# Patient Record
Sex: Female | Born: 1940 | Race: White | Hispanic: No | Smoking: Never smoker
Health system: Southern US, Community
[De-identification: ages and names within clinical notes are randomized; demographics above are authoritative.]

## PROBLEM LIST (undated history)

## (undated) DIAGNOSIS — E559 Vitamin D deficiency, unspecified: Secondary | ICD-10-CM

## (undated) DIAGNOSIS — E119 Type 2 diabetes mellitus without complications: Secondary | ICD-10-CM

## (undated) DIAGNOSIS — C801 Malignant (primary) neoplasm, unspecified: Secondary | ICD-10-CM

## (undated) DIAGNOSIS — K5190 Ulcerative colitis, unspecified, without complications: Secondary | ICD-10-CM

## (undated) DIAGNOSIS — I2510 Atherosclerotic heart disease of native coronary artery without angina pectoris: Secondary | ICD-10-CM

## (undated) DIAGNOSIS — I10 Essential (primary) hypertension: Secondary | ICD-10-CM

## (undated) DIAGNOSIS — E079 Disorder of thyroid, unspecified: Secondary | ICD-10-CM

## (undated) DIAGNOSIS — F0390 Unspecified dementia without behavioral disturbance: Secondary | ICD-10-CM

## (undated) DIAGNOSIS — N390 Urinary tract infection, site not specified: Secondary | ICD-10-CM

## (undated) DIAGNOSIS — K922 Gastrointestinal hemorrhage, unspecified: Secondary | ICD-10-CM

## (undated) HISTORY — DX: Disorder of thyroid, unspecified: E07.9

## (undated) HISTORY — DX: Vitamin D deficiency, unspecified: E55.9

## (undated) HISTORY — DX: Unspecified dementia, unspecified severity, without behavioral disturbance, psychotic disturbance, mood disturbance, and anxiety: F03.90

## (undated) HISTORY — DX: Type 2 diabetes mellitus without complications: E11.9

## (undated) HISTORY — DX: Urinary tract infection, site not specified: N39.0

## (undated) HISTORY — DX: Gastrointestinal hemorrhage, unspecified: K92.2

## (undated) HISTORY — PX: CORONARY ARTERY BYPASS GRAFT: SHX141

## (undated) HISTORY — PX: SKIN BIOPSY: SHX1

---

## 2004-08-17 ENCOUNTER — Ambulatory Visit: Payer: Self-pay | Admitting: Family Medicine

## 2004-10-18 ENCOUNTER — Ambulatory Visit: Payer: Self-pay | Admitting: Ophthalmology

## 2004-10-25 ENCOUNTER — Ambulatory Visit: Payer: Self-pay | Admitting: Ophthalmology

## 2005-01-17 ENCOUNTER — Ambulatory Visit: Payer: Self-pay | Admitting: Unknown Physician Specialty

## 2005-08-27 ENCOUNTER — Ambulatory Visit: Payer: Self-pay | Admitting: Family Medicine

## 2006-10-07 ENCOUNTER — Ambulatory Visit: Payer: Self-pay | Admitting: Internal Medicine

## 2007-07-28 ENCOUNTER — Other Ambulatory Visit: Payer: Self-pay

## 2007-07-28 ENCOUNTER — Ambulatory Visit: Payer: Self-pay | Admitting: Ophthalmology

## 2007-09-14 ENCOUNTER — Ambulatory Visit: Payer: Self-pay | Admitting: Family Medicine

## 2007-12-22 ENCOUNTER — Ambulatory Visit: Payer: Self-pay | Admitting: Family Medicine

## 2008-03-17 ENCOUNTER — Ambulatory Visit: Payer: Self-pay | Admitting: Internal Medicine

## 2010-01-21 ENCOUNTER — Ambulatory Visit: Payer: Self-pay | Admitting: Internal Medicine

## 2010-01-23 ENCOUNTER — Ambulatory Visit: Payer: Self-pay | Admitting: Family Medicine

## 2010-01-27 ENCOUNTER — Ambulatory Visit: Payer: Self-pay | Admitting: Family Medicine

## 2010-06-01 ENCOUNTER — Ambulatory Visit: Payer: Self-pay | Admitting: Internal Medicine

## 2010-06-07 ENCOUNTER — Inpatient Hospital Stay: Payer: Self-pay | Admitting: Internal Medicine

## 2010-07-21 ENCOUNTER — Ambulatory Visit: Payer: Self-pay

## 2010-07-21 ENCOUNTER — Ambulatory Visit: Payer: Self-pay | Admitting: Interventional Cardiology

## 2010-07-31 ENCOUNTER — Ambulatory Visit: Payer: Self-pay | Admitting: Internal Medicine

## 2011-10-23 ENCOUNTER — Ambulatory Visit: Payer: Self-pay | Admitting: Unknown Physician Specialty

## 2011-10-24 LAB — PATHOLOGY REPORT

## 2012-08-06 ENCOUNTER — Ambulatory Visit: Payer: Self-pay | Admitting: Internal Medicine

## 2012-09-08 ENCOUNTER — Ambulatory Visit: Payer: Self-pay | Admitting: Unknown Physician Specialty

## 2013-02-25 ENCOUNTER — Inpatient Hospital Stay: Payer: Self-pay | Admitting: Internal Medicine

## 2013-02-25 LAB — URINALYSIS, COMPLETE
Bilirubin,UR: NEGATIVE
Glucose,UR: NEGATIVE mg/dL (ref 0–75)
Glucose,UR: NEGATIVE mg/dL (ref 0–75)
Hyaline Cast: 1
Nitrite: NEGATIVE
Nitrite: POSITIVE
Ph: 6 (ref 4.5–8.0)
Ph: 6 (ref 4.5–8.0)
Protein: 30
Protein: NEGATIVE
Squamous Epithelial: NONE SEEN
WBC UR: 36 /HPF (ref 0–5)

## 2013-02-25 LAB — COMPREHENSIVE METABOLIC PANEL
Albumin: 2.2 g/dL — ABNORMAL LOW (ref 3.4–5.0)
Alkaline Phosphatase: 110 U/L (ref 50–136)
Anion Gap: 17 — ABNORMAL HIGH (ref 7–16)
BUN: 33 mg/dL — ABNORMAL HIGH (ref 7–18)
Bilirubin,Total: 0.3 mg/dL (ref 0.2–1.0)
Calcium, Total: 8.1 mg/dL — ABNORMAL LOW (ref 8.5–10.1)
EGFR (African American): 28 — ABNORMAL LOW
Glucose: 144 mg/dL — ABNORMAL HIGH (ref 65–99)
Osmolality: 267 (ref 275–301)
SGPT (ALT): 12 U/L (ref 12–78)
Total Protein: 7.7 g/dL (ref 6.4–8.2)

## 2013-02-25 LAB — BASIC METABOLIC PANEL
Anion Gap: 12 (ref 7–16)
BUN: 26 mg/dL — ABNORMAL HIGH (ref 7–18)
Calcium, Total: 7.2 mg/dL — ABNORMAL LOW (ref 8.5–10.1)
Chloride: 107 mmol/L (ref 98–107)
Creatinine: 1.55 mg/dL — ABNORMAL HIGH (ref 0.60–1.30)
EGFR (African American): 38 — ABNORMAL LOW
EGFR (Non-African Amer.): 33 — ABNORMAL LOW
Potassium: 3.4 mmol/L — ABNORMAL LOW (ref 3.5–5.1)
Sodium: 135 mmol/L — ABNORMAL LOW (ref 136–145)

## 2013-02-25 LAB — CBC
HCT: 34.5 % — ABNORMAL LOW (ref 35.0–47.0)
HGB: 11.3 g/dL — ABNORMAL LOW (ref 12.0–16.0)
MCH: 26.6 pg (ref 26.0–34.0)
MCV: 82 fL (ref 80–100)
Platelet: 727 10*3/uL — ABNORMAL HIGH (ref 150–440)
RBC: 4.23 10*6/uL (ref 3.80–5.20)
RDW: 16.4 % — ABNORMAL HIGH (ref 11.5–14.5)

## 2013-02-25 LAB — LIPASE, BLOOD: Lipase: 43 U/L — ABNORMAL LOW (ref 73–393)

## 2013-02-25 LAB — CLOSTRIDIUM DIFFICILE BY PCR

## 2013-02-26 LAB — CBC WITH DIFFERENTIAL/PLATELET
Basophil #: 0 10*3/uL (ref 0.0–0.1)
Eosinophil #: 0 10*3/uL (ref 0.0–0.7)
HCT: 29.3 % — ABNORMAL LOW (ref 35.0–47.0)
HGB: 9.8 g/dL — ABNORMAL LOW (ref 12.0–16.0)
Lymphocyte #: 0.8 10*3/uL — ABNORMAL LOW (ref 1.0–3.6)
MCH: 27.2 pg (ref 26.0–34.0)
MCHC: 33.5 g/dL (ref 32.0–36.0)
MCV: 81 fL (ref 80–100)
Monocyte %: 2.7 %
Neutrophil #: 7.2 10*3/uL — ABNORMAL HIGH (ref 1.4–6.5)
Platelet: 525 10*3/uL — ABNORMAL HIGH (ref 150–440)
RBC: 3.6 10*6/uL — ABNORMAL LOW (ref 3.80–5.20)
RDW: 16.8 % — ABNORMAL HIGH (ref 11.5–14.5)
WBC: 8.3 10*3/uL (ref 3.6–11.0)

## 2013-02-26 LAB — MAGNESIUM: Magnesium: 2.2 mg/dL

## 2013-02-26 LAB — COMPREHENSIVE METABOLIC PANEL
Alkaline Phosphatase: 84 U/L (ref 50–136)
BUN: 19 mg/dL — ABNORMAL HIGH (ref 7–18)
Bilirubin,Total: 0.3 mg/dL (ref 0.2–1.0)
EGFR (African American): 44 — ABNORMAL LOW
EGFR (Non-African Amer.): 38 — ABNORMAL LOW
Osmolality: 274 (ref 275–301)
Potassium: 3.5 mmol/L (ref 3.5–5.1)
SGOT(AST): 12 U/L — ABNORMAL LOW (ref 15–37)
SGPT (ALT): 10 U/L — ABNORMAL LOW (ref 12–78)
Sodium: 136 mmol/L (ref 136–145)
Total Protein: 6.2 g/dL — ABNORMAL LOW (ref 6.4–8.2)

## 2013-02-27 LAB — BASIC METABOLIC PANEL
BUN: 14 mg/dL (ref 7–18)
Calcium, Total: 7.2 mg/dL — ABNORMAL LOW (ref 8.5–10.1)
Chloride: 100 mmol/L (ref 98–107)
Creatinine: 1.42 mg/dL — ABNORMAL HIGH (ref 0.60–1.30)
Glucose: 316 mg/dL — ABNORMAL HIGH (ref 65–99)

## 2013-02-27 LAB — CBC WITH DIFFERENTIAL/PLATELET
Basophil #: 0 10*3/uL (ref 0.0–0.1)
HCT: 26.9 % — ABNORMAL LOW (ref 35.0–47.0)
HGB: 9.2 g/dL — ABNORMAL LOW (ref 12.0–16.0)
Lymphocyte #: 0.7 10*3/uL — ABNORMAL LOW (ref 1.0–3.6)
Lymphocyte %: 17.8 %
MCV: 81 fL (ref 80–100)
Monocyte #: 0.4 x10 3/mm (ref 0.2–0.9)
Platelet: 521 10*3/uL — ABNORMAL HIGH (ref 150–440)
RBC: 3.34 10*6/uL — ABNORMAL LOW (ref 3.80–5.20)
RDW: 16.5 % — ABNORMAL HIGH (ref 11.5–14.5)

## 2013-02-27 LAB — URINE CULTURE

## 2013-02-27 LAB — WBCS, STOOL

## 2013-02-28 LAB — BASIC METABOLIC PANEL
Anion Gap: 7 (ref 7–16)
BUN: 12 mg/dL (ref 7–18)
Calcium, Total: 7.5 mg/dL — ABNORMAL LOW (ref 8.5–10.1)
Chloride: 99 mmol/L (ref 98–107)
Co2: 31 mmol/L (ref 21–32)
EGFR (African American): 59 — ABNORMAL LOW
EGFR (Non-African Amer.): 51 — ABNORMAL LOW
Glucose: 326 mg/dL — ABNORMAL HIGH (ref 65–99)
Osmolality: 286 (ref 275–301)
Sodium: 137 mmol/L (ref 136–145)

## 2013-02-28 LAB — CBC WITH DIFFERENTIAL/PLATELET
Eosinophil #: 0 10*3/uL (ref 0.0–0.7)
Eosinophil %: 0 %
HCT: 28.9 % — ABNORMAL LOW (ref 35.0–47.0)
HGB: 9.7 g/dL — ABNORMAL LOW (ref 12.0–16.0)
Lymphocyte %: 18.6 %
MCHC: 33.6 g/dL (ref 32.0–36.0)
MCV: 81 fL (ref 80–100)
Monocyte %: 10.5 %
Neutrophil #: 3.4 10*3/uL (ref 1.4–6.5)
Platelet: 471 10*3/uL — ABNORMAL HIGH (ref 150–440)
RDW: 16.5 % — ABNORMAL HIGH (ref 11.5–14.5)

## 2013-02-28 LAB — STOOL CULTURE

## 2013-03-01 LAB — BASIC METABOLIC PANEL
Anion Gap: 3 — ABNORMAL LOW (ref 7–16)
Calcium, Total: 8.1 mg/dL — ABNORMAL LOW (ref 8.5–10.1)
Co2: 32 mmol/L (ref 21–32)
EGFR (Non-African Amer.): 40 — ABNORMAL LOW
Glucose: 297 mg/dL — ABNORMAL HIGH (ref 65–99)
Sodium: 133 mmol/L — ABNORMAL LOW (ref 136–145)

## 2013-03-02 LAB — BASIC METABOLIC PANEL
Anion Gap: 5 — ABNORMAL LOW (ref 7–16)
BUN: 16 mg/dL (ref 7–18)
Calcium, Total: 7.8 mg/dL — ABNORMAL LOW (ref 8.5–10.1)
Co2: 28 mmol/L (ref 21–32)
Creatinine: 1.28 mg/dL (ref 0.60–1.30)
EGFR (African American): 48 — ABNORMAL LOW
EGFR (Non-African Amer.): 41 — ABNORMAL LOW
Glucose: 304 mg/dL — ABNORMAL HIGH (ref 65–99)
Osmolality: 279 (ref 275–301)
Potassium: 4.9 mmol/L (ref 3.5–5.1)

## 2013-03-03 LAB — PATHOLOGY REPORT

## 2013-03-19 ENCOUNTER — Other Ambulatory Visit: Payer: Self-pay | Admitting: Unknown Physician Specialty

## 2013-03-19 LAB — CLOSTRIDIUM DIFFICILE BY PCR

## 2013-10-07 ENCOUNTER — Inpatient Hospital Stay: Payer: Self-pay | Admitting: Internal Medicine

## 2013-10-07 LAB — COMPREHENSIVE METABOLIC PANEL
ALK PHOS: 87 U/L
Albumin: 2.6 g/dL — ABNORMAL LOW (ref 3.4–5.0)
Anion Gap: 9 (ref 7–16)
BILIRUBIN TOTAL: 0.3 mg/dL (ref 0.2–1.0)
BUN: 12 mg/dL (ref 7–18)
CALCIUM: 8.5 mg/dL (ref 8.5–10.1)
CHLORIDE: 106 mmol/L (ref 98–107)
CREATININE: 1.61 mg/dL — AB (ref 0.60–1.30)
Co2: 20 mmol/L — ABNORMAL LOW (ref 21–32)
EGFR (Non-African Amer.): 31 — ABNORMAL LOW
GFR CALC AF AMER: 36 — AB
Glucose: 118 mg/dL — ABNORMAL HIGH (ref 65–99)
Osmolality: 271 (ref 275–301)
Potassium: 4 mmol/L (ref 3.5–5.1)
SGOT(AST): 10 U/L — ABNORMAL LOW (ref 15–37)
SGPT (ALT): 11 U/L — ABNORMAL LOW (ref 12–78)
Sodium: 135 mmol/L — ABNORMAL LOW (ref 136–145)
Total Protein: 7.6 g/dL (ref 6.4–8.2)

## 2013-10-07 LAB — LIPASE, BLOOD: Lipase: 53 U/L — ABNORMAL LOW (ref 73–393)

## 2013-10-07 LAB — CBC
HCT: 23.1 % — AB (ref 35.0–47.0)
HGB: 6.9 g/dL — ABNORMAL LOW (ref 12.0–16.0)
MCH: 18.8 pg — ABNORMAL LOW (ref 26.0–34.0)
MCHC: 29.7 g/dL — AB (ref 32.0–36.0)
MCV: 63 fL — ABNORMAL LOW (ref 80–100)
PLATELETS: 691 10*3/uL — AB (ref 150–440)
RBC: 3.65 10*6/uL — AB (ref 3.80–5.20)
RDW: 19.6 % — AB (ref 11.5–14.5)
WBC: 12.2 10*3/uL — ABNORMAL HIGH (ref 3.6–11.0)

## 2013-10-08 LAB — CBC WITH DIFFERENTIAL/PLATELET
Basophil #: 0 10*3/uL (ref 0.0–0.1)
Basophil %: 0.2 %
Eosinophil #: 0 10*3/uL (ref 0.0–0.7)
Eosinophil %: 0.1 %
HCT: 29 % — ABNORMAL LOW (ref 35.0–47.0)
HGB: 9.5 g/dL — AB (ref 12.0–16.0)
LYMPHS PCT: 13 %
Lymphocyte #: 1.1 10*3/uL (ref 1.0–3.6)
MCH: 22.9 pg — AB (ref 26.0–34.0)
MCHC: 32.7 g/dL (ref 32.0–36.0)
MCV: 70 fL — AB (ref 80–100)
MONO ABS: 0.3 x10 3/mm (ref 0.2–0.9)
Monocyte %: 3.5 %
NEUTROS ABS: 6.8 10*3/uL — AB (ref 1.4–6.5)
Neutrophil %: 83.2 %
PLATELETS: 512 10*3/uL — AB (ref 150–440)
RBC: 4.14 10*6/uL (ref 3.80–5.20)
RDW: 27 % — AB (ref 11.5–14.5)
WBC: 8.2 10*3/uL (ref 3.6–11.0)

## 2013-10-08 LAB — BASIC METABOLIC PANEL
ANION GAP: 6 — AB (ref 7–16)
BUN: 8 mg/dL (ref 7–18)
CALCIUM: 7.5 mg/dL — AB (ref 8.5–10.1)
Chloride: 111 mmol/L — ABNORMAL HIGH (ref 98–107)
Co2: 19 mmol/L — ABNORMAL LOW (ref 21–32)
Creatinine: 1.24 mg/dL (ref 0.60–1.30)
EGFR (African American): 50 — ABNORMAL LOW
GFR CALC NON AF AMER: 43 — AB
Glucose: 112 mg/dL — ABNORMAL HIGH (ref 65–99)
Osmolality: 271 (ref 275–301)
Potassium: 4.1 mmol/L (ref 3.5–5.1)
Sodium: 136 mmol/L (ref 136–145)

## 2013-10-08 LAB — SEDIMENTATION RATE: ERYTHROCYTE SED RATE: 32 mm/h — AB (ref 0–30)

## 2013-10-08 LAB — HEMOGLOBIN: HGB: 9.3 g/dL — AB (ref 12.0–16.0)

## 2013-10-09 LAB — HEMOGLOBIN: HGB: 9.2 g/dL — ABNORMAL LOW (ref 12.0–16.0)

## 2013-10-09 LAB — CLOSTRIDIUM DIFFICILE(ARMC)

## 2013-10-10 LAB — BASIC METABOLIC PANEL
ANION GAP: 4 — AB (ref 7–16)
BUN: 8 mg/dL (ref 7–18)
Calcium, Total: 7.5 mg/dL — ABNORMAL LOW (ref 8.5–10.1)
Chloride: 111 mmol/L — ABNORMAL HIGH (ref 98–107)
Co2: 23 mmol/L (ref 21–32)
Creatinine: 1.25 mg/dL (ref 0.60–1.30)
EGFR (African American): 49 — ABNORMAL LOW
GFR CALC NON AF AMER: 42 — AB
Glucose: 249 mg/dL — ABNORMAL HIGH (ref 65–99)
OSMOLALITY: 282 (ref 275–301)
Potassium: 4.7 mmol/L (ref 3.5–5.1)
Sodium: 138 mmol/L (ref 136–145)

## 2013-10-10 LAB — CBC WITH DIFFERENTIAL/PLATELET
Basophil #: 0 10*3/uL (ref 0.0–0.1)
Basophil %: 0 %
EOS PCT: 0 %
Eosinophil #: 0 10*3/uL (ref 0.0–0.7)
HCT: 29.3 % — ABNORMAL LOW (ref 35.0–47.0)
HGB: 9.3 g/dL — ABNORMAL LOW (ref 12.0–16.0)
Lymphocyte #: 1 10*3/uL (ref 1.0–3.6)
Lymphocyte %: 10.6 %
MCH: 22.6 pg — AB (ref 26.0–34.0)
MCHC: 31.6 g/dL — AB (ref 32.0–36.0)
MCV: 71 fL — AB (ref 80–100)
Monocyte #: 0.6 x10 3/mm (ref 0.2–0.9)
Monocyte %: 6.6 %
NEUTROS PCT: 82.8 %
Neutrophil #: 7.9 10*3/uL — ABNORMAL HIGH (ref 1.4–6.5)
Platelet: 504 10*3/uL — ABNORMAL HIGH (ref 150–440)
RBC: 4.11 10*6/uL (ref 3.80–5.20)
RDW: 28.1 % — AB (ref 11.5–14.5)
WBC: 9.6 10*3/uL (ref 3.6–11.0)

## 2013-10-11 LAB — STOOL CULTURE

## 2013-10-13 LAB — COMPREHENSIVE METABOLIC PANEL
ALK PHOS: 56 U/L
Albumin: 2.2 g/dL — ABNORMAL LOW (ref 3.4–5.0)
Anion Gap: 6 — ABNORMAL LOW (ref 7–16)
BILIRUBIN TOTAL: 0.2 mg/dL (ref 0.2–1.0)
BUN: 11 mg/dL (ref 7–18)
CO2: 23 mmol/L (ref 21–32)
Calcium, Total: 8 mg/dL — ABNORMAL LOW (ref 8.5–10.1)
Chloride: 105 mmol/L (ref 98–107)
Creatinine: 1.07 mg/dL (ref 0.60–1.30)
EGFR (African American): 59 — ABNORMAL LOW
EGFR (Non-African Amer.): 51 — ABNORMAL LOW
Glucose: 205 mg/dL — ABNORMAL HIGH (ref 65–99)
OSMOLALITY: 274 (ref 275–301)
Potassium: 4 mmol/L (ref 3.5–5.1)
SGOT(AST): 21 U/L (ref 15–37)
SGPT (ALT): 25 U/L (ref 12–78)
Sodium: 134 mmol/L — ABNORMAL LOW (ref 136–145)
Total Protein: 6.1 g/dL — ABNORMAL LOW (ref 6.4–8.2)

## 2013-10-13 LAB — CBC WITH DIFFERENTIAL/PLATELET
BASOS PCT: 0.1 %
Basophil #: 0 10*3/uL (ref 0.0–0.1)
Eosinophil #: 0 10*3/uL (ref 0.0–0.7)
Eosinophil %: 0 %
HCT: 29.1 % — ABNORMAL LOW (ref 35.0–47.0)
HGB: 9.2 g/dL — AB (ref 12.0–16.0)
LYMPHS ABS: 1.9 10*3/uL (ref 1.0–3.6)
LYMPHS PCT: 17.5 %
MCH: 22.4 pg — ABNORMAL LOW (ref 26.0–34.0)
MCHC: 31.7 g/dL — AB (ref 32.0–36.0)
MCV: 71 fL — ABNORMAL LOW (ref 80–100)
MONOS PCT: 8.7 %
Monocyte #: 1 x10 3/mm — ABNORMAL HIGH (ref 0.2–0.9)
NEUTROS PCT: 73.7 %
Neutrophil #: 8.1 10*3/uL — ABNORMAL HIGH (ref 1.4–6.5)
Platelet: 471 10*3/uL — ABNORMAL HIGH (ref 150–440)
RBC: 4.11 10*6/uL (ref 3.80–5.20)
RDW: 29.1 % — ABNORMAL HIGH (ref 11.5–14.5)
WBC: 10.9 10*3/uL (ref 3.6–11.0)

## 2013-10-13 LAB — STOOL CULTURE

## 2013-12-02 ENCOUNTER — Other Ambulatory Visit: Payer: Self-pay | Admitting: Unknown Physician Specialty

## 2013-12-02 LAB — CLOSTRIDIUM DIFFICILE(ARMC)

## 2014-05-12 DIAGNOSIS — D649 Anemia, unspecified: Secondary | ICD-10-CM | POA: Insufficient documentation

## 2014-05-12 DIAGNOSIS — I10 Essential (primary) hypertension: Secondary | ICD-10-CM | POA: Insufficient documentation

## 2014-05-12 DIAGNOSIS — K5120 Ulcerative (chronic) proctitis without complications: Secondary | ICD-10-CM | POA: Insufficient documentation

## 2014-05-12 DIAGNOSIS — E559 Vitamin D deficiency, unspecified: Secondary | ICD-10-CM | POA: Insufficient documentation

## 2014-06-15 DIAGNOSIS — R001 Bradycardia, unspecified: Secondary | ICD-10-CM | POA: Insufficient documentation

## 2014-06-15 DIAGNOSIS — I6529 Occlusion and stenosis of unspecified carotid artery: Secondary | ICD-10-CM | POA: Insufficient documentation

## 2014-08-06 DIAGNOSIS — Z86010 Personal history of colonic polyps: Secondary | ICD-10-CM | POA: Insufficient documentation

## 2014-08-06 DIAGNOSIS — N183 Chronic kidney disease, stage 3 unspecified: Secondary | ICD-10-CM | POA: Insufficient documentation

## 2014-08-06 DIAGNOSIS — E785 Hyperlipidemia, unspecified: Secondary | ICD-10-CM | POA: Insufficient documentation

## 2014-08-06 DIAGNOSIS — M810 Age-related osteoporosis without current pathological fracture: Secondary | ICD-10-CM | POA: Insufficient documentation

## 2014-08-06 DIAGNOSIS — E039 Hypothyroidism, unspecified: Secondary | ICD-10-CM | POA: Insufficient documentation

## 2014-08-06 DIAGNOSIS — F1027 Alcohol dependence with alcohol-induced persisting dementia: Secondary | ICD-10-CM | POA: Insufficient documentation

## 2014-08-06 DIAGNOSIS — Z860101 Personal history of adenomatous and serrated colon polyps: Secondary | ICD-10-CM | POA: Insufficient documentation

## 2014-08-06 DIAGNOSIS — M5136 Other intervertebral disc degeneration, lumbar region: Secondary | ICD-10-CM | POA: Insufficient documentation

## 2014-08-06 DIAGNOSIS — E119 Type 2 diabetes mellitus without complications: Secondary | ICD-10-CM | POA: Insufficient documentation

## 2014-10-12 ENCOUNTER — Ambulatory Visit: Payer: Self-pay | Admitting: Internal Medicine

## 2015-01-21 NOTE — Consult Note (Signed)
CC diarrhea, dehydration.  Pt is feeling a little better today, not as weak, more animated, sitting up in chair.  No vomiting today, stools starting to thicken up a bit, still had 8 bowel movements today.  CO2 up to 16 from 13 yesterday, still on HCO3 drip.  Will need to supplement K likely, check Mg level tomorrow.  stool neg for campylobacter.  No bleeding.  Starting to make progress.  No change in orders.  Electronic Signatures: Manya Silvas (MD)  (Signed on 29-May-14 17:25)  Authored  Last Updated: 29-May-14 17:25 by Manya Silvas (MD)

## 2015-01-21 NOTE — H&P (Signed)
PATIENT NAME:  Bethany Russell, SEVERIN MR#:  [id number] DATE OF BIRTH:  [birth year]  DATE OF ADMISSION:  02/25/2013  HISTORY OF PRESENT ILLNESS: The patient is a 74 year old white female who been experiencing severe, copious diarrhea for about 5 days now, severe watery diarrhea, day and night. The patient has had probably 10 bowel movement at least from midnight until noon today. She was seen in our office at the request of Dr. Raechel Ache, and admitted to the hospital. She had been admitted by Dr. Laurin Coder after patient was seen in our office and sent to the hospital. The patient reports feeling weak and miserable. The diarrhea has been large volume movements accompanied by significant abdominal cramps. No bleeding. She has had some dry heaves, but did have anything in her stomach to throw up. Her throat has been somewhat sore since Saturday, mostly on the right.   She has not been on any antibiotics in the last few months. She does not have any pets. She has not eaten any food that might have been suspected to be bad. No one else in the family is ill. She does have well water, and no one else in the family that drinks the water is ill.   HABITS:  Does not smoke. No alcohol in the last 4 years. History of previous alcohol usage. She denies any headaches. No chest pains. No abnormal shortness of breath.   PAST MEDICAL HISTORY:  She has diabetes, and is on insulin. She has been a diabetic for at least 20 years.   She has a history of coronary artery disease, 3-vessel CABG done in 1990. She had 2 heart attacks before that. Her regular cardiologist is Dr. Nehemiah Massed.   The patient denies any fever, but she has had chills with the diarrhea. She is accompanied by her daughter.   PHYSICAL EXAMINATION: GENERAL:   An elderly white female in no acute distress, looks weak and washed out.  VITAL SIGNS: Temp 97.8, pulse 94, respirations 18, blood pressure 124/70, O2 sat 97% on room air.  HEENT:  She has ketotic-smelling breath.  The head is atraumatic. Tongue has slight coating on it, suggestive of early yeast infection.  CHEST:  Clear anterior fields.  HEART:  Shows no murmurs or gallops I can hear.  ABDOMEN:  Bowel sounds are present. There is some diffuse mild to moderate tenderness.  EXTREMITIES: No edema.   LAB DATA:  She has a urinary tract infection, with 3+ leukocyte esterase, and C. diff is negative. White count 14,000, hemoglobin 11.3, platelet count 727. Albumin 2.2, BUN 33, creatinine 1.98, sodium 128, bicarb 13, anion gap 17.   CAT scan of the abdomen shows diffuse colitis, not quite as bad in the right colon.   ASSESSMENT:  Severe, copious, watery diarrhea, most likely is an infection. Cannot rule out flare of intrinsic colitis, possibly ulcerative colitis. However, usually with diarrhea this bad with  ulcerative colitis, blood is seen, and she has not been passing any blood. Rarely, diabetes itself can cause secretory diarrhea.   PLAN:  She is supposed to be started on bicarb drip soon. I contacted the hospitalist, Dr.  Vianne Bulls,  and asked her to check on her tonight. Because of the possibility of colitis, I am going to treat her with some low-dose IV Solu-Medrol. She is on insulin sliding scale, and may have to take more insulin because of this. She had been started on Rocephin and changed to Zosyn while we await stool studies.  ____________________________ Manya Silvas, MD rte:mr D: 02/25/2013 18:42:00 ET T: 02/25/2013 19:07:57 ET JOB#: [id number]  cc: Manya Silvas, MD, <Dictator> Corey Skains, MD  Manya Silvas MD ELECTRONICALLY SIGNED 03/11/2013 7:14

## 2015-01-21 NOTE — Consult Note (Signed)
Chief Complaint:  Subjective/Chief Complaint Covering for Dr. Vira Agar. Lots of diarrhea yest. One so far this AM. Stool starting to get more formed   VITAL SIGNS/ANCILLARY NOTES: **Vital Signs.:   31-May-14 04:46  Vital Signs Type Routine  Temperature Temperature (F) 97.7  Celsius 36.5  Temperature Source oral  Pulse Pulse 71  Respirations Respirations 18  Systolic BP Systolic BP 161  Diastolic BP (mmHg) Diastolic BP (mmHg) 75  Mean BP 102  Pulse Ox % Pulse Ox % 94  Pulse Ox Activity Level  At rest  Oxygen Delivery Room Air/ 21 %   Brief Assessment:  GEN no acute distress   Cardiac Regular   Respiratory clear BS   Gastrointestinal mild low abd tenderness   Lab Results: Routine Chem:  31-May-14 05:13   Glucose, Serum  326  BUN 12  Creatinine (comp) 1.07  Sodium, Serum 137  Potassium, Serum 3.6  Chloride, Serum 99  CO2, Serum 31  Calcium (Total), Serum  7.5  Anion Gap 7  Osmolality (calc) 286  eGFR (African American)  59  eGFR (Non-African American)  51 (eGFR values <72m/min/1.73 m2 may be an indication of chronic kidney disease (CKD). Calculated eGFR is useful in patients with stable renal function. The eGFR calculation will not be reliable in acutely ill patients when serum creatinine is changing rapidly. It is not useful in  patients on dialysis. The eGFR calculation may not be applicable to patients at the low and high extremes of body sizes, pregnant women, and vegetarians.)  Routine Hem:  31-May-14 05:13   WBC (CBC) 4.7  Hemoglobin (CBC)  9.7  Hematocrit (CBC)  28.9  Platelet Count (CBC)  471  MCV 81  MCH 27.3  MCHC 33.6  RDW  16.5  Neutrophil % 70.8  Lymphocyte % 18.6  Monocyte % 10.5  Eosinophil % 0.0  Basophil % 0.1  Neutrophil # 3.4  Lymphocyte #  0.9  Monocyte # 0.5  Eosinophil # 0.0  Basophil # 0.0 (Result(s) reported on 28 Feb 2013 at 06:07AM.)   Assessment/Plan:  Assessment/Plan:  Assessment Ulcerative colits. Gradually  improving.   Plan Continue to hydrate. Continue to correct electrolyte imbalance. Contineu current meds.   Electronic Signatures: OVerdie Shire(MD)  (Signed 31-May-14 10:38)  Authored: Chief Complaint, VITAL SIGNS/ANCILLARY NOTES, Brief Assessment, Lab Results, Assessment/Plan   Last Updated: 31-May-14 10:38 by OVerdie Shire(MD)

## 2015-01-21 NOTE — H&P (Signed)
PATIENT NAME:  Bethany Russell, Bethany Russell MR#:  [id number] DATE OF BIRTH:  1941-06-28  DATE OF ADMISSION:  02/25/2013  ADDENDUM:    Per discussion with the Pharmacy, we are on backup order for metronidazole for which we are going to change her antibiotic to Zosyn.   ____________________________ Adel Sink, MD rsg:cb D: 02/25/2013 16:10:49 ET T: 02/25/2013 16:50:02 ET JOB#: [id number]  cc: Luther Sink, MD, <Dictator> Shevelle Smither America Brown MD ELECTRONICALLY SIGNED 03/09/2013 1:51

## 2015-01-21 NOTE — Discharge Summary (Signed)
PATIENT NAME:  Bethany Russell, Bethany Russell MR#:  [id number] DATE OF BIRTH:  01-20-1941  DATE OF ADMISSION:  02/25/2013 DATE OF DISCHARGE:  03/02/2013  PRIMARY CARE PHYSICIAN: Dr. Raechel Ache.   GASTROENTEROLOGIST: Dr. Vira Agar.   FINAL DIAGNOSES:  1. Ulcerative colitis flare with diarrhea and abdominal pain.  2. Diabetes.  3. Acute renal failure.  4. Urinary tract infection.  5. Gastroesophageal reflux disease.  6. Hypertension.  7. Depression.   MEDICATIONS ON DISCHARGE: Include acetaminophen 325 mg 1 to 2 tablets every 4 to 6 hours as needed for pain, fish oil 1000 mg daily, melatonin 3 mg at bedtime, Plavix 75 mg daily, Humalog Pen 8 units subcutaneous injection 3 times a day with meals, metoprolol extended release 50 mg daily, Zoloft 50 mg daily, alendronate 70 mg once a week, promethazine 12.5 mg every 4 to 6 hours as needed for nausea, Lantus 24 units injection subcutaneous daily, Asacol 800 mg 1 tablet 3 times a day, prednisone 20 mg 2 tablets orally daily until seen by Dr. Vira Agar, then he will start tapering the dose.   DIET: Low sodium diet, low residue.   FOLLOWUP: With Dr. Vira Agar on Friday, Dr. Raechel Ache in 1 to 2 weeks.   HOSPITAL COURSE: The patient was admitted 02/25/2013 with severe diarrhea for 5 days prior to admission, watery, day and night. The patient was started on IV Solu-Medrol, IV Zosyn and stool studies were sent off and IV fluid hydration. Laboratory and radiological data during the hospital course included a urinalysis that showed 3+ leukocyte esterase, 1+ bacteria. Lipase 43, glucose 144, BUN 33, creatinine 1.98, sodium 128, potassium 3.7, chloride 98, CO2 13, calcium 8.1. Liver function tests normal range. Albumin low at 2.2. White blood cell count 14.0, H and H 11.3 and 34.5, platelet count of 727. EKG showed a normal sinus rhythm, inferior infarct, anterolateral infarct. Urine culture grew out greater than 100,000 E. coli. Stool for C. diff was negative. CT scan of the abdomen and  pelvis showed findings consistent with colitis. White blood cells in the stool showed 50% polymorphs, 50% eosinophils. Stool culture negative. Creatinine upon discharge 1.28, sodium 133. Sugar upon discharge 215. Procedures during the hospital course included a flexible sigmoidoscope that showed granularity in the rectum, biopsied. Likely diffuse ulcerative colitis.   HOSPITAL COURSE PER PROBLEM LIST:  1. For the patient's ulcerative colitis flare, diarrhea and abdominal pain, symptoms had improved. The patient, on the day of discharge, did have small bowel movements, very small amount and 3 bowel movements. She felt much better and wanted to go home. The patient was on Solu-Medrol since coming into the hospital. I did speak with Dr. Vira Agar who wanted close clinical followup and a followup on Friday. The patient will be kept on 40 mg of prednisone until then and can continue her Asacol. I did hold the aspirin at this time. Stool studies were negative, and the patient's abdominal pain had dissipated and improved.  2. For the patient's diabetes, sugars were high being on the steroids. She is on Lantus and insulin prior to meals.  3. For her acute renal failure, the patient was given IV fluid hydration. Creatinine had improved.  4. For her UTI with E. coli sensitive, she was given Zosyn during the hospital stay. No need for further antibiotics at this point.  5. For her gastroesophageal reflux disease, she is not on any medication for that.  6. For hypertension, she can restart Toprol as outpatient.  7. For her depression, she is  on Zoloft which she can continue.    TIME SPENT ON DISCHARGE: 35 minutes.   ____________________________ Tana Conch. Leslye Peer, MD rjw:gb D: 03/02/2013 16:57:43 ET T: 03/02/2013 22:20:03 ET JOB#: [id number]  cc: Tana Conch. Leslye Peer, MD, <Dictator> Christena Flake. Raechel Ache, MD Manya Silvas, MD  Marisue Brooklyn MD ELECTRONICALLY SIGNED 03/04/2013 15:18

## 2015-01-21 NOTE — H&P (Signed)
PATIENT NAME:  Bethany Russell, Bethany Russell MR#:  [id number] DATE OF BIRTH:  03-17-1941  DATE OF ADMISSION:  02/25/2013  PRIMARY CARE PHYSICIAN: Dr. Ezequiel Kayser.   REFERRING PHYSICIAN: Dr. Loletta Specter.   PRIMARY GASTROENTEROLOGIST: Dr. Vira Agar.   CHIEF COMPLAINT: Severe diarrhea with nausea and Russell heaving, severe dehydration.   HISTORY OF PRESENT ILLNESS: Bethany Russell is a very nice 74 year old female who has history of previous severe alcoholism, but not drinking since the last four years. She has diabetes, hypertension, hyperlipidemia, coronary artery disease. She underwent a bypass surgery in 1990. Apparently, she has been recently diagnosed with a type of colitis. Looking of the biopsies from 2013, has chronic inflammatory colitis, possible ulcerative colitis versus microangiopathic or collagen related colitis. The patient comes today with a history of five days of having significant diarrhea. Diarrhea started first,  then the patient became very nauseated and having significant Russell heaving. The patient has been able to drink fluids and keep it down, but she has not been able to eat any solid food. She was trying to keep herself hydrated, but at this moment, she is starting to get dizzy and lightheaded, feeling really bad, very symptomatic and having diarrhea more than 10 times a day for which she decided to go to see Dr. Raechel Ache.  Dr. Raechel Ache did some blood work in the office and sent her to be seen at gastroenterology and from the  gastroenterology office, the patient was sent here to be evaluated. At this moment, the patient is stable. She is still very symptomatic and looks very dehydrated, for what she is admitted for following on treatment and evaluation.   REVIEW OF SYSTEMS:  CONSTITUTIONAL: The patient feels really fatigued. She denies any fever, but she has occasional chills. Denies any significant, weight loss or weight gain, but she feels really weak.  EYES: No blurry vision, double vision.  History of cataracts status post surgery.  ENT: No tinnitus. No difficulty swallowing. No postnasal drip.  RESPIRATORY: No cough, wheezing, hemoptysis or significant chronic obstructive pulmonary disease.   CARDIOVASCULAR: No chest pain, orthopnea, syncope or palpitations. She had a CABG in 1990; since then she has not had any problems with her heart, she states. No syncopal episodes.  GASTROINTESTINAL: Positive nausea, no vomiting, but severe Russell heaving. Positive severe diarrhea more than 10 times a day. Positive abdominal pain, mostly located in the right lower quadrant and mesogastric area. No rectal bleeding. No melena. No hematemesis. No significant constipation. The patient has history of diverticula in the past and also history of colitis as mentioned above. Her last colonoscopy was in January 2013, and she had diffuse mild inflammation secondary to colitis. Biopsy showed chronic colitis for what she is taking Asacol. GENITOURINARY:  No significant dysuria or hematuria, but the patient does have a significant urinary tract infection based on the urinalysis and elevation of white count.  ENDOCRINE: No polyuria, polydipsia, polyphagia, cold or heat intolerance.  HEMATOLOGIC AND LYMPHATIC: No anemia, easy bruising or bleeding. No swollen glands.  SKIN: No rashes, petechiae. Very Russell skin today.  MUSCULOSKELETAL: The patient has chronic back pain. No significant gout or swelling of joints.    NEUROLOGICAL: No CVAs or transient ischemic attacks, numbness, tingling on any part of her body. No dementia, no seizures.  PSYCHIATRIC: No significant anxiety, insomnia or nervousness.  She does take medications for depression, but has been controlled for years.   PAST MEDICAL HISTORY: 1.  Insulin-dependent diabetes. Well controlled.  2.  Hypertension,  3.  Hyperlipidemia.  4.  Coronary artery disease.  5.  History of CABG in 1990.  6.  History of chronic colitis on Asacol.  7.  Recovering alcoholic.   8.  Chronic back pain.  9.  Depression.   PAST SURGICAL HISTORY:  1.  Coronary artery bypass graft in 1990.  2.  Cataract surgery.   ALLERGIES: Not known drug allergies.  SOCIAL HISTORY: The patient used to be a heavy drinker, a pint of vodka a day for about 40 years. Right now, she has not drunk in the last four years. She has never smoked. She lives with her daughter now.   FAMILY HISTORY: Positive for colon cancer in her brother, myocardial infarction in both parents significant hypertension, coronary artery disease. No diabetes.   CURRENT MEDICATIONS: Include Tylenol p.r.n. pain,  alendronate 70 mg once a week, Asacol 800 mg 3 times daily, aspirin 81 mg once a day, fish oil once a day, Humalog 8 units subcutaneously 3 times a day before meals, Imodium for diarrhea, Lantus 26 units once a day, melatonin 3 mg once at night, metoprolol 50 mg once a day extended-release, Plavix 75 mg daily, promethazine 12.5 mg every 4 to 6 hours, sertraline 50 mg once a day.   PHYSICAL EXAMINATION: VITAL SIGNS: Blood pressure 123/63, pulse 120 on admission, blood pressure on admission was 92/50, now 123/67, temperature 97.7.  GENERAL: Alert and oriented x 3. Dehydrated, looking very symptomatic with her abdominal pain and nausea.  HEENT: Pupils are equal and reactive. Extraocular movements are intact. Mucosa is very Russell. Anicteric sclerae. Pink conjunctivae. No oral lesions. No oropharyngeal exudates. No thrush.  NECK: Supple. No JVD. No thyromegaly. No adenopathy. No carotid bruits. No rigidity.  CARDIOVASCULAR: Regular rate and rhythm, slightly tachycardic. No murmurs, rubs or gallops. No displacement of PMI. No tenderness to palpation anterior chest wall.  LUNGS: Clear without any wheezing or crepitus. No use of accessory muscles.  ABDOMEN: Soft, mild, tender to palpation of right lower and right upper quadrant just along the frame of the colon. There is no rebound. There no guarding. Bowel sounds are  positive. No hepatosplenomegaly.  GENITAL: The patient wearing a diaper. No external lesions on genital area.  EXTREMITIES: No edema, no cyanosis, no clubbing. Pulses +2. Capillary refill less than 3.  NEUROLOGIC: Cranial nerves II through XII intact. No focal findings. Strength is 5/5 in four extremities.  PSYCHIATRIC: Mood is flat affect, but no significant agitation.  LYMPHATIC: Negative for lymphadenopathy in neck or supraclavicular areas.  SKIN:  No rashes or petechiae, very decreased turgor, severe dehydration.  MUSCULOSKELETAL: No significant joint effusions or swelling.   LABORATORY, DIAGNOSTIC AND RADIOLOGICAL DATA: Glucose 144, BUN 33, creatinine 1.98, sodium 128, CO2 of 13, GFR is around 24, anion gap is elevated at 17. Lipase 43, calcium 8.1, albumin 2.2. LFTs within normal limits. CBC with a white count of 14,000, hemoglobin 11.3, platelets 727. C. diff test is negative so far. Urinalysis has 228 white blood cells,  repeated urinalysis with INO catheter has 36 white blood cells with a significant amount of leukocyte esterase and positive nitrates.   EKG: Normal sinus rhythm.   ASSESSMENT AND PLAN: This is a 74 year old female with history of some type of chronic colitis on Asacol, diabetes, hypertension, hyperlipidemia, coronary artery disease, chronic back pain, depression, recovering alcoholic who comes with significant diarrhea.  1.  Systemic inflammatory response syndrome. The patient has tachycardia of 120, elevation of white blood cells of 14,000. Significant  dehydration, but no fever at this moment. She is mildly acidotic. This could be related to infectious process on the abdominal cavity. The differential diagnoses include colitis, Clostridium difficile we have one test that is negative, but I would like to repeat it later on.  2.  Antibiotics. Focus on anaerobe and gram-negative have been started. The patient also has a urinary tract infection.  3.  Diarrhea. The patient could  have infectious colitis. I am going to order a CT scan. I cannot use IV contrast due to the patient's creatinine may increase, but the patient does have a diverticula and it could be diverticulitis. We are going to start him on metronidazole and ciprofloxacin for now, as the patient looks significantly ill, mildly toxic with increased white blood cells. Obtain gastroenterology consultation, as the patient has history of colitis. I do not think this is flare of inflammatory colitis, but it is  possible. I am not going to start any steroids just yet.    4.  Metabolic acidosis. The patient has significant acidosis with an increase of anion gap and  a CO2 of 13, likely due to significant diarrhea and dehydration, but it could be also related to septic process for what I am going to start her on a bicarbonate drip and monitor her labs closely.  5.  Acute kidney injury. The patient is started on IV fluids, monitor closely, avoid any type of medications that would be nephrotoxins.  Possible acute tubular necrosis.  6.  Diabetes. At this moment since the patient is not eating, I  am going to hold on her Lactose and treat her only with insulin sliding scale, prevent hypoglycemia.  7.  Hypertension. Her presentation was hypotension, for what I am going to hold on her blood pressure medications.  8.  Deep vein thrombosis prophylaxis with heparin,  9.  Gastrointestinal prophylaxis with Protonix.  10.  Continue Asacol if the patient tolerates oral.    TIME SPENT: I spent about 50 minutes with this patient.    ____________________________ Coldwater Sink, MD rsg:cc D: 02/25/2013 16:09:01 ET T: 02/25/2013 17:13:02 ET JOB#: [id number]  cc: Brewster Sink, MD, <Dictator> Orva Riles America Brown MD ELECTRONICALLY SIGNED 03/09/2013 1:52

## 2015-01-21 NOTE — Consult Note (Signed)
Chief Complaint:  Subjective/Chief Complaint Pt feels better but still goes to [facility] frequently. According to daughter, patient drinks alot due to her thirstiness from DM, which may contribute to chronic diarrhea. Also, drinks lots of diet coke at home. Glucose high due to IV steroids.   VITAL SIGNS/ANCILLARY NOTES: **Vital Signs.:   01-Jun-14 05:35  Vital Signs Type Routine  Temperature Temperature (F) 98  Celsius 36.6  Temperature Source oral  Pulse Pulse 72  Respirations Respirations 17  Systolic BP Systolic BP 532  Diastolic BP (mmHg) Diastolic BP (mmHg) 76  Mean BP 90  Pulse Ox % Pulse Ox % 97  Pulse Ox Activity Level  At rest  Oxygen Delivery Room Air/ 21 %   Brief Assessment:  GEN no acute distress   Cardiac Regular   Respiratory clear BS   Gastrointestinal Normal   Lab Results: Routine Chem:  01-Jun-14 06:13   Glucose, Serum  297  BUN 15  Creatinine (comp)  1.31  Sodium, Serum  133  Potassium, Serum 4.0  Chloride, Serum 98  CO2, Serum 32  Calcium (Total), Serum  8.1  Anion Gap  3  Osmolality (calc) 278  eGFR (African American)  46  eGFR (Non-African American)  40 (eGFR values <104m/min/1.73 m2 may be an indication of chronic kidney disease (CKD). Calculated eGFR is useful in patients with stable renal function. The eGFR calculation will not be reliable in acutely ill patients when serum creatinine is changing rapidly. It is not useful in  patients on dialysis. The eGFR calculation may not be applicable to patients at the low and high extremes of body sizes, pregnant women, and vegetarians.)   Assessment/Plan:  Assessment/Plan:  Assessment Chronic diarrha. Ulcerative colitis.   Plan Continue solumedrol and zosyn. No discharge until frequency of diarrhea significantly improves. Dr. EVira Agarwill check back tomorrow. thanks.   Electronic Signatures: OVerdie Shire(MD)  (Signed 01-Jun-14 09:38)  Authored: Chief Complaint, VITAL SIGNS/ANCILLARY NOTES, Brief  Assessment, Lab Results, Assessment/Plan   Last Updated: 01-Jun-14 09:38 by OVerdie Shire(MD)

## 2015-01-21 NOTE — Consult Note (Signed)
CC: diarrhea. Pt had flex sig to 25cm unpreped and showed diffuse ulcerative colitis, scattered ulcers.  Continue antibiotics and iv solumedrol, continue coverage with sliding scale, continue to hydrate, can drop HCO3 drip down and give supplemental K.  Can't go home til diarrhea much better.  Dr,. Oh will follow over weekend.  Electronic Signatures: Manya Silvas (MD)  (Signed on 30-May-14 19:42)  Authored  Last Updated: 30-May-14 19:42 by Manya Silvas (MD)

## 2015-01-22 NOTE — Consult Note (Signed)
Pt started on Humira today, this was consensus treatment discussed with Dr. Candace Cruise and me.  Pt tolerated shots well. Got 2 today and plan for 2 more tomorrow.  Pt solumedrol decreased to 20mg  tid today.  Will change to oral prednisone tomorrow, likely go home Wednesday.  She is doing much better from admission.  Pt eating diet well, will advance her diet to low residue.  She actually does not follow a diabetic diet at home per her daughter.  Walking in halls and doing well.  Electronic Signatures: Manya Silvas (MD)  (Signed on 12-Jan-15 17:51)  Authored  Last Updated: 12-Jan-15 17:51 by Manya Silvas (MD)

## 2015-01-22 NOTE — Consult Note (Signed)
Pt still with stool loose, a few pieces forming up. No blood seen yesterday in stool.   Abd only slightly tender.  Spoke to pharmacy and will try to start Humira Monday if possible.    Electronic Signatures: Manya Silvas (MD)  (Signed on 10-Jan-15 13:38)  Authored  Last Updated: 10-Jan-15 13:38 by Manya Silvas (MD)

## 2015-01-22 NOTE — Consult Note (Signed)
Pt had PPD put on 2 days ago, reading by me at this time is negative. Will check a CXR anyway.  Will try mashed potatoes with salt for supper.  Electronic Signatures: Manya Silvas (MD)  (Signed on 10-Jan-15 14:00)  Authored  Last Updated: 10-Jan-15 14:00 by Manya Silvas (MD)

## 2015-01-22 NOTE — Consult Note (Signed)
Talked with patient and family about the next treatment plan for her and they would like to try Humira since she has failed 5ASA medication and requiring steroids which interfears with her diabetes.  Will try to arrange this for next week.  Continue Solumedrol for now.  Electronic Signatures: Manya Silvas (MD)  (Signed on 09-Jan-15 19:38)  Authored  Last Updated: 09-Jan-15 19:38 by Manya Silvas (MD)

## 2015-01-22 NOTE — Consult Note (Signed)
Chief Complaint:  Subjective/Chief Complaint Slightly better overnight. Less diarrhea. No bleeding. Mild cramping. C.diff neg. Hep Bs Ag neg.   VITAL SIGNS/ANCILLARY NOTES: **Vital Signs.:   09-Jan-15 05:58  Vital Signs Type Routine  Temperature Temperature (F) 98.1  Celsius 36.7  Pulse Pulse 90  Respirations Respirations 17  Systolic BP Systolic BP 226  Diastolic BP (mmHg) Diastolic BP (mmHg) 77  Mean BP 93  Pulse Ox % Pulse Ox % 97  Pulse Ox Activity Level  At rest  Oxygen Delivery Room Air/ 21 %   Brief Assessment:  GEN no acute distress   Cardiac Regular   Respiratory clear BS   Gastrointestinal Normal   Lab Results: Routine Chem:  08-Jan-15 05:35   Glucose, Serum  112  BUN 8  Creatinine (comp) 1.24  Sodium, Serum 136  Potassium, Serum 4.1  Chloride, Serum  111  CO2, Serum  19  Calcium (Total), Serum  7.5  Anion Gap  6  Osmolality (calc) 271  eGFR (African American)  50  eGFR (Non-African American)  43 (eGFR values <20m/min/1.73 m2 may be an indication of chronic kidney disease (CKD). Calculated eGFR is useful in patients with stable renal function. The eGFR calculation will not be reliable in acutely ill patients when serum creatinine is changing rapidly. It is not useful in  patients on dialysis. The eGFR calculation may not be applicable to patients at the low and high extremes of body sizes, pregnant women, and vegetarians.)  Routine Hem:  08-Jan-15 05:35   Hemoglobin (CBC)  9.5  WBC (CBC) 8.2  RBC (CBC) 4.14  Hematocrit (CBC)  29.0  Platelet Count (CBC)  512  MCV  70  MCH  22.9  MCHC 32.7  RDW  27.0  Neutrophil % 83.2  Lymphocyte % 13.0  Monocyte % 3.5  Eosinophil % 0.1  Basophil % 0.2  Neutrophil #  6.8  Lymphocyte # 1.1  Monocyte # 0.3  Eosinophil # 0.0  Basophil # 0.0 (Result(s) reported on 08 Oct 2013 at 06:27AM.)   Assessment/Plan:  Assessment/Plan:  Assessment Ulcerative colitis flare.   Plan It is clear that asacol is  inadequate for her. Even prednisone took long time over the summer to adequately control her sxs. PPD done to see if patient candidate for biologic agents, such as humira or remicaide. TPMT ordered to consier imuran or 6 MP. For now, continue prednisone but make sure to moniter her BG's closely. Dr. EVira Agarwill take over her case over the weekend. Thanks.   Electronic Signatures: OVerdie Shire(MD)  (Signed 09-Jan-15 07:47)  Authored: Chief Complaint, VITAL SIGNS/ANCILLARY NOTES, Brief Assessment, Lab Results, Assessment/Plan   Last Updated: 09-Jan-15 07:47 by OVerdie Shire(MD)

## 2015-01-22 NOTE — H&P (Signed)
PATIENT NAME:  Bethany Russell, Bethany Russell MR#:  [id number] DATE OF BIRTH:  19-Jun-1941  DATE OF ADMISSION:  10/07/2013  PRIMARY CARE PHYSICIAN: Ezequiel Kayser, MD  PRIMARY GASTROENTEROLOGIST: Dr. Vira Agar.   CHIEF COMPLAINT: Abdominal pain, weakness.   HISTORY OF PRESENTING ILLNESS: This is a 74 year old female patient with history of dementia, ulcerative colitis on Asacol, with anemia of chronic disease who presents to the hospital complaining of abdominal pain and weakness. The patient has had chronic diarrhea secondary to ulcerative colitis which is not worse. Daughter noted some blood in her stools 2 days back, nothing over the last 2 days.   The patient initially called the nurse practitioner at Dr. Percell Boston office who advised the patient to come to the Emergency Room. Here, the patient has been noticed to have anemia of 6.9.   The patient does have orthostatic positive with heart rate going up from 111 to 137 on standing from a lying down position.   The patient is a poor historian with significant dementia. Most of the history has been obtained from the daughter, reviewing old records, and speaking with the ER staff.   The patient is on aspirin and Plavix, not on any NSAIDs medications. She was last seen in the GI clinic in May after the patient was discharged from the hospital after an ulcerative colitis flare.   REVIEW OF SYSTEMS: CONSTITUTIONAL: Complains of weakness.  EYES: No blurry vision, pain, or redness.  ENT: No tinnitus, ear pain, or hearing loss.  RESPIRATORY: No cough, wheezing, or hemoptysis.  CARDIOVASCULAR: No chest pain, orthopnea or edema. She does have coronary artery disease.  GASTROINTESTINAL: Has had nausea but no vomiting. Abdominal pain, cramping with chronic diarrhea and blood in stool 2 days back.  ENDOCRINE: No polyuria or nocturia.  HEMOLYMPHATIC: Has chronic anemia.  SKIN: No petechiae, rash, ulcers.  MUSCULOSKELETAL: Has some chronic back pain.  NEUROLOGIC: No  focal numbness, weakness, seizures.  PSYCHIATRIC: No anxiety or depression. Does have dementia.   PAST MEDICAL HISTORY:  1.  Ulcerative colitis on Asacol.  2.  Insulin-dependent diabetes mellitus.  3.  Hypertension.  4.  Hyperlipidemia.  5.  Coronary artery disease.  6.  CABG in 1990.  7.  Alcohol abuse in the past.  8.  Chronic back pain.  9.  Depression.     PAST SURGICAL HISTORY: CABG and cataract surgery.   ALLERGIES: None.   SOCIAL HISTORY: The patient used to abuse alcohol in the past but has not had any alcohol in 5 years. She lives with her daughter at this time. Walks with a cane.   CODE STATUS: FULL CODE.   FAMILY HISTORY: Positive for colon cancer in her brother and MI in both parents.   HOME MEDICATIONS:  Include:  1.  Asacol 800 mg oral 3 times a day.  2.  Aspirin 81 mg daily.  3.  Plavix 75 mg daily.  4.  Alendronate 70 mg oral weekly. 5.  Fish oil 1000 mg oral once a day.  6.  Humalog sliding scale.  7.  Lantus 26 units subcutaneous once a day.  8.  Levothyroxine 25 mcg oral once a day.  9.  Melatonin 3 mg oral at bedtime.  10.  Metoprolol succinate 50 mg oral once a day.  11.  Protonix 40 mg daily.  12.  Pravastatin 80 mg daily.  13.  Sertraline 50 mg daily.  14.  Tylenol Extra Strength 500 mg two tablets oral once a day.  15.  Vitamin  D2 at 50,000 international units oral once a week.  16.  Voltaren topical apply to affected area 2 times a day.   PHYSICAL EXAMINATION:  VITAL SIGNS: Temperature 98.4, pulse of 111, blood pressure 116/57. Orthostatic vitals showed heart rate of 111 lying down and 137 on standing.  GENERAL: Elderly, frail Caucasian female patient sitting up in bed, seems comfortable.  PSYCHIATRIC: Alert, oriented to person and place but not to time. Pleasant.  HEENT: Atraumatic, normocephalic. Oral mucosa dry and pink. Pallor positive. No icterus. Pupils bilaterally equal and reactive to light.  NECK: Supple. No thyromegaly or palpable  lymph nodes. Trachea midline. No carotid bruit, JVD.  CARDIOVASCULAR: S1, S2 tachycardic without any murmurs. Peripheral pulses 2+. No edema.  RESPIRATORY: Normal work of breathing. Clear to auscultation on both sides.  GASTROINTESTINAL: Soft abdomen. Tenderness diffusely. Noted to be guarding. Bowel sounds present. No hepatosplenomegaly palpable.  SKIN: Warm and dry. No petechiae, rash, ulcers.  GENITOURINARY: No CVA tenderness or bladder distention.  MUSCULOSKELETAL: No joint swelling, swelling, redness, effusion of the large joints. Normal muscle tone.  NEUROLOGICAL: Motor strength 5/5 in upper extremities.  LYMPHATIC: No cervical lymphadenopathy.   LABORATORY STUDIES: Glucose 118, BUN 12, creatinine 1.61, sodium 135, potassium 4, chloride 106, GFR of 31. AST, ALT, alkaline phosphatase, bilirubin normal. WBC 12.2, hemoglobin 6.9, platelets of 691 with MCV 63. Blood group of O positive. The patient's baseline creatinine is 1.28. Hemoglobin 9.5.   CT scan of the abdomen and pelvis pending.   EKG shows sinus tachycardia with PVCs. Poor R-wave progression. Inferior lead Q waves.   ASSESSMENT AND PLAN:  1.  Acute-on-chronic iron deficiency anemia. The patient had low MCV suggesting chronic iron deficiency anemia and anemia of chronic disease, but since her orthostatics are positive, I am suspecting she had some acute blood-loss anemia secondary to her ulcerative colitis. The patient likely has ulcerative colitis flare. Will start her on prednisone along with continuing the Asacol. Will consult gastroenterology. Await CT scan results. Will watch out for any acute gastrointestinal bleed. Check hemoglobin q.6 hour for now. Also put her on IV fluids and clear liquid diet.  2.   Systemic inflammatory response syndrome secondary to ulcerative colitis.  3.  Acute renal failure secondary to dehydration. Start on IV fluids. We will monitor this.  4.  Insulin-dependent diabetes mellitus. We will continue the  Levemir   but at a lower dose of 20 units.  5.  Hypotension. Continue home medications.  6.  Deep venous thrombosis prophylaxis with sequential compression devices.  7.  For acute blood loss anemia, the patient will receive a STAT dose of 1 unit packed RBCs considering the symptomatic anemia.   CRITICAL CARE TIME SPENT ON THIS CASE: Was 67 minutes.    ____________________________ Leia Alf Zailynn Brandel, MD srs:np D: 10/07/2013 17:54:05 ET T: 10/07/2013 18:36:28 ET JOB#: [id number]  cc: Alveta Heimlich R. Darvin Neighbours, MD, <Dictator> Manya Silvas, MD Christena Flake. Raechel Ache, MD Neita Carp MD ELECTRONICALLY SIGNED 10/20/2013 18:15

## 2015-01-22 NOTE — Consult Note (Signed)
PATIENT NAME:  Bethany Russell, Bethany Russell MR#:  [id number] DATE OF BIRTH:  1940/10/27  DATE OF CONSULTATION:  10/08/2013  PRIMARY PHYSICIAN:  Ezequiel Kayser, MD CONSULTING PROVIDERS:  Payton Emerald, NP;  Verdie Shire, MD PRIMARY GASTROENTEROLOGIST: Gaylyn Cheers, MD ATTENDING PHYSICIAN: Dr. Darvin Neighbours.  CONSULT: [facility].  REASON FOR CONSULTATION:  Colitis, anemia.   HISTORY OF PRESENT ILLNESS: Ms. Girdler is a 74 year old Caucasian female who has significant past medical history of dementia, ulcerative colitis, hypertension, hyperlipidemia, coronary artery disease as well as alcohol abuse and depression. The patient is under the care on an outpatient basis by Dawson Bills, NP, as well as Dr. Gaylyn Cheers.   In reviewing EMR, she had a flexible sigmoidoscopy performed by Dr. Vira Agar on 02/27/2013 with findings of diffuse area of granular mucosa found in the rectum and the sigmoid colon, a few discrete ulcers seen in the sigmoid colon and a small sessile polyp. The patient was hospitalized at that time with symptoms very similar to what is presenting at this point.   Colonoscopy was done in 2013 with findings of diffuse mild inflammation characterized by erythema and granularity found in the sigmoid colon and biopsies were taken for histology. Diffuse mild inflammation characterized by erythema and granularity was found in the descending colon, biopsies was also obtained. Diffuse, mildly erythematous granular mucosa was also found in the transverse colon as well as ascending colon. Terminal ileum appeared to be normal at that time. Internal hemorrhoids were also noted.   The patient presented to the Emergency Room on 10/07/2013 for the concern of chronic diarrhea as well as a 10-day history of abdominal pain. Pain generalized but mostly located to entire lower abdomen below umbilicus. Daughter is present who is aiding in history being obtained and states that for the past 3 years, her mother has never had formed stools. It  has always been loose and diarrhea-like, numerous times a day every time she eats as well as nocturnally.   When she was hospitalized in May of last year, nocturnal bowel movements were causing her to awaken and then unable to make it to the bathroom. Continues with fecal incontinence now. Nocturnal bowel movements occurring but is able to sleep through the night with the use of Depends. She has wasted 10 Depends within the last 24 hours.   The daughter feels that there is just no sphincter tone present. She has had blood present for the past 48 hours, a moderate amount this morning. The patient is complaining of pain being an 8 on a scale of 1 to 10 at this time. Nauseated, no vomiting. No recent antibiotic use other than Diflucan 2 months ago. Only antifungal for her ear. Color of stool is brown. Weight loss of 5 to 10 pounds since November.   At the time of her last hospitalization when she was discharged, she was actually on steroid therapy from May to July. She has a history of diabetes mellitus as previously stated which has not been well controlled and she becomes very hyperglycemic with steroid therapy.   The patient had a CT scan of abdomen and pelvis done on January 7, during this admission, with findings of spleen, pancreas and adrenal glands appearing to be unremarkable. Colon wall thickening, particularly in the transverse colon to the rectum, with loss of whole, straight shin and pericolonic hypervascularity in the mesenteric area. Thus, diagnosed with diffuse colitis, borderline extrahepatic biliary ductal dilatation, evidence of atherosclerosis as well as levoconvex thoracolumbar scoliosis, findings with considerable  spondylosis and degenerative disk disease. The radiologist notes a prior right hemicolectomy, but in reviewing our ER records from Denice Paradise, NP, surgical history does not mention that having been performed. Will need to clarify with radiologist.   HOME MEDICATIONS: Asacol  800 mg three times daily, aspirin 81 mg a day, Plavix 75 mg a day, alendronate 70 mg once weekly, fish oil 1000 mg once a day, Humalog sliding scale, Lantus 26 units subcu once a day, levothyroxine 25 mcg once a day, melatonin 3 mg at bedtime, metoprolol succinate 50 mg once a day, Protonix 40 mg a day, pravastatin 80 mg a day, Zoloft 50 mg once a day, Extra Strength Tylenol 500 mg two tablets once a day, vitamin D 50,000 international units once a week, and Voltaren topical gel apply twice a day as needed.   ALLERGIES: None.   PAST MEDICAL HISTORY: Pancolonic ulcerative colitis, insulin-dependent diabetes mellitus, hypertension, hyperlipidemia, coronary artery disease, alcohol abuse, chronic back pain, depression.   PAST SURGICAL HISTORY: CABG  1990, and cataract surgery.   FAMILY HISTORY: Brother with history of colon cancer diagnosed at the age of 42, deceased at 67. Both parents, history of coronary artery disease. No history of colonic polyps.   SOCIAL HISTORY: No tobacco, no alcohol use. Resides with her daughter.   REVIEW OF SYSTEMS: All 10 systems reviewed and checked, otherwise unremarkable as stated above.   PHYSICAL EXAMINATION:  VITAL SIGNS: Temperature 98.9 with a pulse of 94, respirations 18, blood pressure is 117/66, and pulse oximetry 97%.  GENERAL: Well-developed, ill-appearing 74 year old Caucasian female. Color pale. The patient holding the abdomen intermittently during interviewing process.  HEENT: Normocephalic, atraumatic. Pupils equal, reactive to light. Conjunctivae pale. Sclerae anicteric.  NECK: Supple. Trachea midline. No lymphadenopathy or thyromegaly.  PULMONARY: Symmetric rise and fall of chest. Clear to auscultation throughout.  CARDIOVASCULAR: Regular rhythm, S1, S2. No murmurs, no gallops.  ABDOMEN: Soft, nondistended. Bowel sounds in all four quadrants. Noted high pitch at times. Concern for possible ileus-like presentation despite the fact  though that her  bowels are moving. Possibly related to the extent of inflammation noted on CT scan. No rebound tenderness.  RECTAL: Deferred.  MUSCULOSKELETAL: Moving all four extremities. No contractures. No clubbing. Gait assessed. Stable with assistance.  SKIN: Warm, dry. Color pale. No lesions. No rashes.  NEUROLOGICAL: No gross neurological deficits.  PSYCHIATRIC: Alert and oriented x4. Memory grossly intact. Appropriate affect and mood.   LABORATORY AND DIAGNOSTIC: Chemistry panel glucose 118 on 10/07/2013, creatinine 1.61, sodium 135, CO2 is 20, eGFR is 31, otherwise within normal limits. Lipase was low at 53. Today, glucose 112, chloride is 111, CO2 is 19, eGFR is 43 and calcium is 7.5. Hepatic panel: Total protein 7.6, albumin 2.6, AST is 10,  ALT is 11. CBC: WBC count on admission is 12.2 with RBC of 3.65. Hemoglobin was low at 6.9 with hematocrit of 23.1. Platelet count of 691,000. MCV is 63.   The patient has received 2 units of packed red blood cells. Status post transfusions, hemoglobin has improved to 9.3 and 9.5. Currently with a hematocrit of 29.0.   EKG- sinus tachycardia with fusion complexes.   CT scan of abdomen and pelvis with contrast as stated. Evidence of diffuse colitis.   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 Suspect history of not being in remission for a great length of time given the fact that she has continued to experience nocturnal bowel movements long term as well as excessive  amount of diarrhea. Differentially possibly related to diabetic diarrhea versus possibly a neurological state. She does have a history of mild dementia as well. Clostridium difficile concern as well though the patient has not been on recent antibiotic use, just antifungal therapy 2 months ago. Chronic anemia with evidence of worsening status post blood transfusion. Known history of diabetes mellitus.   PLAN: The patient's presentation to be discussed with Dr. Verdie Shire. In  review of EMR, the patient is currently receiving Delzicol 800 mg three times a day as well as prednisone 30 mg twice a day.   Do feel the patient would benefit on a long-term basis with advancing medication treatment for her known history of pancolonic ulcerative colitis. Would recommend proceeding with evaluation for consideration of biologic therapy being started such as Humira. We will review prior laboratory studies as she should have hepatitis B surface antigen test done as well PPD placed. We will continue to monitor at this time, transfuse as necessary.   These services provided by Ebony Cargo, NP, under collaborative agreement with Dr.  Verdie Shire.    ____________________________ Payton Emerald, NP dsh:np D: 10/08/2013 14:27:20 ET T: 10/08/2013 15:22:08 ET JOB#: [id number]  cc: Payton Emerald, NP, <Dictator> Payton Emerald MD ELECTRONICALLY SIGNED 10/09/2013 8:02

## 2015-01-22 NOTE — Consult Note (Signed)
Pt with ulcerative colitis.  She is responding to steroids with no nocturnal bowel movement last night.  Eating all her food.  Will advance to low residue diet.  Hope to start Humira tomorrow and condinue the steroids and slowly taper them off.VSS afebrile.  CXR OK.  Discussed with patient and daughter.  Electronic Signatures: Manya Silvas (MD)  (Signed on 11-Jan-15 10:18)  Authored  Last Updated: 11-Jan-15 10:18 by Manya Silvas (MD)

## 2015-01-22 NOTE — Discharge Summary (Signed)
PATIENT NAME:  Bethany Russell, Bethany Russell MR#:  [id number] DATE OF BIRTH:  02-Mar-1941  DATE OF ADMISSION:  10/07/2013 DATE OF DISCHARGE:  10/14/2013  ADMISSION DIAGNOSIS:  Ulcerative colitis flare.   DISCHARGE DIAGNOSES: 1.  Ulcerative colitis flare.  2.  Anemia, acute blood loss from ulcerative colitis.  3.  Uncontrolled diabetes, predominantly secondary to steroids.    CONSULTATIONS: Dr. Vira Agar.   LABORATORY DATA:   1.  Stool cultures were negative. C. difficile was negative.  2.  Discharge white blood cells 9.6, hemoglobin 9.3, hematocrit 29.3, platelets 504.  3.  Sodium 138, potassium 4.7, chloride 111, bicarb 23, BUN 8, creatinine 1.25 glucose 249.   HOSPITAL COURSE: This is a 74 year old female with ulcerative colitis who presented with ulcerative colitis flare. For further details please refer to the H and P. 1.  Ulcerative colitis flare. The patient underwent a CT scan of her abdomen on admission which showed diffuse ulcerative colitis. Dr. Vira Agar was consulted. She was started on IV steroids, continued on mesalamine. He recommended Humira, we started Humira on Monday. The patient had Humira on Monday and Tuesday, now is being discharged on Wednesday. She tolerated this medication well. She is not having any more episodes of diarrhea. She is on a low residual diet which she will continue for another week.  2.  Systemic inflammatory response syndrome secondary to ulcerative colitis, resolving.  3.  Acute renal failure due to ATN and dehydration, improved with IV fluids.  4.  Acute blood loss anemia on chronic anemia. The patient did receive 1 unit of blood. Her hemoglobin is stable.  5.  Insulin-dependent diabetes mellitus.  The patient was continued on her outpatient medications. Her blood sugars were uncontrolled due to the IV steroids.  6.  Dementia which seemed to be stable at this time.   DISCHARGE MEDICATIONS: 1.  The patient is on a prednisone taper which was written by Dr. Vira Agar  starting at 50 mg and the taper is as per Dr. Vira Agar.  2.  Fish oil 1000 mg daily.  3.  Melatonin 3 mg at bedtime as needed.  4.  Humalog 8 to 4 units t.i.d. with meals.  5.  Zoloft 50 mg daily.  6.  Alendronate 70 mg on Sundays.  7.  Asacol 800 mg t.i.d.  8.  Tylenol 325 mg 2 tablets daily.  9.  Lantus 26 units daily in the morning.  10.  Metoprolol 50 mg in the morning.  11.  Plavix 75 mg daily.  12.  Vitamin D 250,000 international units on Sundays.  13.  Voltaren topical gel.  14.  Synthroid 25 mcg daily.  15.  Aspirin 81 mg daily.  16.  Pravastatin 80 mg daily.  17.  Pantoprazole 40 mg daily.   DISCHARGE DIET: Low residual diet then ADA regular diet.   DISCHARGE ACTIVITY: As tolerated.   DISCHARGE FOLLOWUP: The patient will follow up next week with Dr. Vira Agar.   TIME SPENT:  35 minutes. The patient is medically stable for discharge.     ____________________________ Dayton Kenley P. Benjie Karvonen, MD spm:cs D: 10/14/2013 13:36:01 ET T: 10/14/2013 14:20:31 ET JOB#: [id number]  cc: Lark Runk P. Benjie Karvonen, MD, <Dictator> Manya Silvas, MD Donell Beers Elven Laboy MD ELECTRONICALLY SIGNED 10/14/2013 21:27

## 2015-02-04 DIAGNOSIS — F341 Dysthymic disorder: Secondary | ICD-10-CM | POA: Insufficient documentation

## 2015-02-04 DIAGNOSIS — Z79899 Other long term (current) drug therapy: Secondary | ICD-10-CM | POA: Insufficient documentation

## 2015-05-26 ENCOUNTER — Emergency Department
Admission: EM | Admit: 2015-05-26 | Discharge: 2015-05-26 | Disposition: A | Discharge status: Home / Self Care | Payer: Medicare Other | Attending: Emergency Medicine | Admitting: Emergency Medicine

## 2015-05-26 ENCOUNTER — Emergency Department: Payer: Medicare Other

## 2015-05-26 ENCOUNTER — Encounter: Payer: Self-pay | Admitting: Emergency Medicine

## 2015-05-26 DIAGNOSIS — E119 Type 2 diabetes mellitus without complications: Secondary | ICD-10-CM | POA: Diagnosis not present

## 2015-05-26 DIAGNOSIS — R197 Diarrhea, unspecified: Secondary | ICD-10-CM

## 2015-05-26 DIAGNOSIS — K5190 Ulcerative colitis, unspecified, without complications: Secondary | ICD-10-CM | POA: Diagnosis not present

## 2015-05-26 DIAGNOSIS — R000 Tachycardia, unspecified: Secondary | ICD-10-CM | POA: Insufficient documentation

## 2015-05-26 DIAGNOSIS — I10 Essential (primary) hypertension: Secondary | ICD-10-CM | POA: Insufficient documentation

## 2015-05-26 DIAGNOSIS — Z79899 Other long term (current) drug therapy: Secondary | ICD-10-CM | POA: Insufficient documentation

## 2015-05-26 DIAGNOSIS — E860 Dehydration: Secondary | ICD-10-CM

## 2015-05-26 HISTORY — DX: Ulcerative colitis, unspecified, without complications: K51.90

## 2015-05-26 HISTORY — DX: Malignant (primary) neoplasm, unspecified: C80.1

## 2015-05-26 HISTORY — DX: Type 2 diabetes mellitus without complications: E11.9

## 2015-05-26 HISTORY — DX: Essential (primary) hypertension: I10

## 2015-05-26 LAB — LIPASE, BLOOD

## 2015-05-26 LAB — URINALYSIS COMPLETE WITH MICROSCOPIC (ARMC ONLY)
Bilirubin Urine: NEGATIVE
GLUCOSE, UA: NEGATIVE mg/dL
HGB URINE DIPSTICK: NEGATIVE
KETONES UR: NEGATIVE mg/dL
NITRITE: POSITIVE — AB
Protein, ur: NEGATIVE mg/dL
SPECIFIC GRAVITY, URINE: 1.017 (ref 1.005–1.030)
Squamous Epithelial / LPF: NONE SEEN
pH: 5 (ref 5.0–8.0)

## 2015-05-26 LAB — COMPREHENSIVE METABOLIC PANEL
ALBUMIN: 2.7 g/dL — AB (ref 3.5–5.0)
ALK PHOS: 47 U/L (ref 38–126)
ALT: 13 U/L — ABNORMAL LOW (ref 14–54)
ANION GAP: 9 (ref 5–15)
AST: 24 U/L (ref 15–41)
BUN: 20 mg/dL (ref 6–20)
CALCIUM: 8.6 mg/dL — AB (ref 8.9–10.3)
CO2: 22 mmol/L (ref 22–32)
Chloride: 103 mmol/L (ref 101–111)
Creatinine, Ser: 1.41 mg/dL — ABNORMAL HIGH (ref 0.44–1.00)
GFR calc Af Amer: 41 mL/min — ABNORMAL LOW (ref >60)
GFR calc non Af Amer: 35 mL/min — ABNORMAL LOW (ref >60)
Glucose, Bld: 111 mg/dL — ABNORMAL HIGH (ref 65–99)
Potassium: 4.1 mmol/L (ref 3.5–5.1)
SODIUM: 134 mmol/L — AB (ref 135–145)
Total Bilirubin: 0.6 mg/dL (ref 0.3–1.2)
Total Protein: 6.9 g/dL (ref 6.5–8.1)

## 2015-05-26 LAB — CBC
HCT: 32.4 % — ABNORMAL LOW (ref 35.0–47.0)
HEMOGLOBIN: 10.2 g/dL — AB (ref 12.0–16.0)
MCH: 23.2 pg — ABNORMAL LOW (ref 26.0–34.0)
MCHC: 31.6 g/dL — AB (ref 32.0–36.0)
MCV: 73.4 fL — ABNORMAL LOW (ref 80.0–100.0)
Platelets: 466 10*3/uL — ABNORMAL HIGH (ref 150–440)
RBC: 4.41 MIL/uL (ref 3.80–5.20)
RDW: 17.6 % — ABNORMAL HIGH (ref 11.5–14.5)
WBC: 9.2 10*3/uL (ref 3.6–11.0)

## 2015-05-26 LAB — C DIFFICILE QUICK SCREEN W PCR REFLEX
C DIFFICILE (CDIFF) INTERP: NEGATIVE
C Diff antigen: NEGATIVE
C Diff toxin: NEGATIVE

## 2015-05-26 MED ORDER — SODIUM CHLORIDE 0.9 % IV SOLN
1000.0000 mL | Freq: Once | INTRAVENOUS | Status: AC
  Administered 2015-05-26: 1000 mL via INTRAVENOUS

## 2015-05-26 MED ORDER — CIPROFLOXACIN HCL 500 MG PO TABS: 500.0000 mg | ORAL_TABLET | Freq: Two times a day (BID) | ORAL | Status: DC

## 2015-05-26 MED ORDER — METRONIDAZOLE 500 MG PO TABS: 500.0000 mg | ORAL_TABLET | Freq: Two times a day (BID) | ORAL | Status: DC

## 2015-05-26 NOTE — ED Notes (Signed)
C. Diff test will be run off existing sample.

## 2015-05-26 NOTE — ED Notes (Signed)
Urine sample was contaminated with stool. MD aware.

## 2015-05-26 NOTE — Discharge Instructions (Signed)
Dehydration, Adult Dehydration is when you lose more fluids from the body than you take in. Vital organs like the kidneys, brain, and heart cannot function without a proper amount of fluids and salt. Any loss of fluids from the body can cause dehydration.  CAUSES   Vomiting.  Diarrhea.  Excessive sweating.  Excessive urine output.  Fever. SYMPTOMS  Mild dehydration  Thirst.  Dry lips.  Slightly dry mouth. Moderate dehydration  Very dry mouth.  Sunken eyes.  Skin does not bounce back quickly when lightly pinched and released.  Dark urine and decreased urine production.  Decreased tear production.  Headache. Severe dehydration  Very dry mouth.  Extreme thirst.  Rapid, weak pulse (more than 100 beats per minute at rest).  Cold hands and feet.  Not able to sweat in spite of heat and temperature.  Rapid breathing.  Blue lips.  Confusion and lethargy.  Difficulty being awakened.  Minimal urine production.  No tears. DIAGNOSIS  Your caregiver will diagnose dehydration based on your symptoms and your exam. Blood and urine tests will help confirm the diagnosis. The diagnostic evaluation should also identify the cause of dehydration. TREATMENT  Treatment of mild or moderate dehydration can often be done at home by increasing the amount of fluids that you drink. It is best to drink small amounts of fluid more often. Drinking too much at one time can make vomiting worse. Refer to the home care instructions below. Severe dehydration needs to be treated at the hospital where you will probably be given intravenous (IV) fluids that contain water and electrolytes. HOME CARE INSTRUCTIONS   Ask your caregiver about specific rehydration instructions.  Drink enough fluids to keep your urine clear or pale yellow.  Drink small amounts frequently if you have nausea and vomiting.  Eat as you normally do.  Avoid:  Foods or drinks high in sugar.  Carbonated  drinks.  Juice.  Extremely hot or cold fluids.  Drinks with caffeine.  Fatty, greasy foods.  Alcohol.  Tobacco.  Overeating.  Gelatin desserts.  Wash your hands well to avoid spreading bacteria and viruses.  Only take over-the-counter or prescription medicines for pain, discomfort, or fever as directed by your caregiver.  Ask your caregiver if you should continue all prescribed and over-the-counter medicines.  Keep all follow-up appointments with your caregiver. SEEK MEDICAL CARE IF:  You have abdominal pain and it increases or stays in one area (localizes).  You have a rash, stiff neck, or severe headache.  You are irritable, sleepy, or difficult to awaken.  You are weak, dizzy, or extremely thirsty. SEEK IMMEDIATE MEDICAL CARE IF:   You are unable to keep fluids down or you get worse despite treatment.  You have frequent episodes of vomiting or diarrhea.  You have blood or green matter (bile) in your vomit.  You have blood in your stool or your stool looks black and tarry.  You have not urinated in 6 to 8 hours, or you have only urinated a small amount of very dark urine.  You have a fever.  You faint. MAKE SURE YOU:   Understand these instructions.  Will watch your condition.  Will get help right away if you are not doing well or get worse. Document Released: 09/17/2005 Document Revised: 12/10/2011 Document Reviewed: 05/07/2011 ExitCare Patient Information 2015 [employer]. This information is not intended to replace advice given to you by your health care provider. Make sure you discuss any questions you have with your health care   provider.  

## 2015-05-26 NOTE — ED Provider Notes (Signed)
[facility] Emergency Department Provider Note  ____________________________________________  Time seen: On arrival  I have reviewed the triage vital signs and the nursing notes.   HISTORY  Chief Complaint Abdominal Pain    HPI Winfield is a 74 y.o. female who presents with complaints of mild abdominal discomfort and diarrhea. They shows a history of ulcerative colitis and is on Humira. She follows with Dr. Vira Agar of gastroenterology whose office referred her to the emergency department today for evaluation.She denies fevers or chills. Denies cough. Denies dysuria. She denies blood in her stool. She reports diarrhea for approximately one week     Past Medical History  Diagnosis Date  . Ulcerative colitis   . Hypertension   . Diabetes mellitus without complication   . Cancer     skin cancer    There are no active problems to display for this patient.   Past Surgical History  Procedure Laterality Date  . Coronary artery bypass graft    . Skin biopsy      No current outpatient prescriptions on file.  Allergies Review of patient's allergies indicates no known allergies.  No family history on file.  Social History Social History  Substance Use Topics  . Smoking status: Never Smoker   . Smokeless tobacco: None  . Alcohol Use: No    Review of Systems  Constitutional: Negative for fever. Eyes: Negative for visual changes. ENT: Negative for sore throat Cardiovascular: Negative for chest pain. Respiratory: Negative for shortness of breath. Gastrointestinal: Positive for diarrhea Genitourinary: Negative for dysuria. Musculoskeletal: Negative for back pain. Skin: Negative for rash. Neurological: Negative for headaches or focal weakness Psychiatric: No anxiety    ____________________________________________   PHYSICAL EXAM:  VITAL SIGNS: ED Triage Vitals  Enc Vitals Group     BP 05/26/15 1042 102/54 mmHg     Pulse  Rate 05/26/15 1042 101     Resp 05/26/15 1042 18     Temp 05/26/15 1042 98.1 F (36.7 C)     Temp Source 05/26/15 1042 Oral     SpO2 05/26/15 1042 99 %     Weight 05/26/15 1042 138 lb (62.596 kg)     Height 05/26/15 1042 5\' 4"  (1.626 m)     Head Cir --      Peak Flow --      Pain Score 05/26/15 1043 10     Pain Loc --      Pain Edu? --      Excl. in [place]? --      Constitutional: Alert and oriented. Well appearing and in no distress. Pleasant and interactive Eyes: Conjunctivae are normal.  ENT   Head: Normocephalic and atraumatic.   Mouth/Throat: Mucous membranes are moist. Cardiovascular: Tachycardia, regular rhythm. Normal and symmetric distal pulses are present in all extremities. No murmurs, rubs, or gallops. Respiratory: Normal respiratory effort without tachypnea nor retractions. Breath sounds are clear and equal bilaterally.  Gastrointestinal: Soft and non-tender in all quadrants. No distention. There is no CVA tenderness. Genitourinary: deferred Musculoskeletal: Nontender with normal range of motion in all extremities. No lower extremity tenderness nor edema. Neurologic:  Normal speech and language. No gross focal neurologic deficits are appreciated. Skin:  Skin is warm, dry and intact. No rash noted. Psychiatric: Mood and affect are normal. Patient exhibits appropriate insight and judgment.  ____________________________________________    LABS (pertinent positives/negatives)  Labs Reviewed - No data to display  ____________________________________________   EKG None  ____________________________________________  RADIOLOGY I have personally reviewed any xrays that were ordered on this patient: None  ____________________________________________   PROCEDURES  Procedure(s) performed: none  Critical Care performed: none  ____________________________________________   INITIAL IMPRESSION / ASSESSMENT AND PLAN / ED COURSE  Pertinent labs &  imaging results that were available during my care of the patient were reviewed by me and considered in my medical decision making (see chart for details).  Patient with some elevation in her BUN and creatinine consistent with dehydration. Normal saline bolus given. Labs are otherwise fairly unremarkable. Patient feels significantly improved after fluids. Discussed this with Dr. Gustavo Lah and Dr. Vira Agar of GI. They agree with discharge with antibiotics. They will see the patient tomorrow at 1:30. Discussed with family and patient may agree as well.  ____________________________________________   FINAL CLINICAL IMPRESSION(S) / ED DIAGNOSES  Final diagnoses:  Dehydration  Diarrhea     Lavonia Drafts, MD 05/26/15 1524

## 2015-05-26 NOTE — ED Notes (Signed)
Pt has a hx of ulcerative colitis, daughter states she has been having diarrhea for about a week now, nausea without vomiting, decreased appetite and fever. Dr Tiffany Kocher told her daughter to bring her to the ER for labs and evaluation.

## 2015-05-28 LAB — STOOL CULTURE

## 2015-05-29 LAB — URINE CULTURE

## 2015-05-30 LAB — O&P RESULT

## 2015-05-30 LAB — OVA + PARASITE EXAM

## 2015-05-31 ENCOUNTER — Emergency Department: Payer: Medicare Other

## 2015-05-31 ENCOUNTER — Inpatient Hospital Stay
Admission: EM | Admit: 2015-05-31 | Discharge: 2015-06-10 | DRG: 871 | Disposition: A | Discharge status: Home / Self Care | Payer: Medicare Other | Attending: Internal Medicine | Admitting: Internal Medicine

## 2015-05-31 ENCOUNTER — Encounter: Payer: Self-pay | Admitting: Emergency Medicine

## 2015-05-31 DIAGNOSIS — Z794 Long term (current) use of insulin: Secondary | ICD-10-CM

## 2015-05-31 DIAGNOSIS — K219 Gastro-esophageal reflux disease without esophagitis: Secondary | ICD-10-CM | POA: Diagnosis present

## 2015-05-31 DIAGNOSIS — K5100 Ulcerative (chronic) pancolitis without complications: Secondary | ICD-10-CM | POA: Diagnosis present

## 2015-05-31 DIAGNOSIS — I10 Essential (primary) hypertension: Secondary | ICD-10-CM | POA: Diagnosis present

## 2015-05-31 DIAGNOSIS — Z1623 Resistance to quinolones and fluoroquinolones: Secondary | ICD-10-CM | POA: Diagnosis not present

## 2015-05-31 DIAGNOSIS — E785 Hyperlipidemia, unspecified: Secondary | ICD-10-CM | POA: Diagnosis present

## 2015-05-31 DIAGNOSIS — Z7952 Long term (current) use of systemic steroids: Secondary | ICD-10-CM

## 2015-05-31 DIAGNOSIS — A4151 Sepsis due to Escherichia coli [E. coli]: Secondary | ICD-10-CM | POA: Diagnosis present

## 2015-05-31 DIAGNOSIS — D62 Acute posthemorrhagic anemia: Secondary | ICD-10-CM | POA: Diagnosis not present

## 2015-05-31 DIAGNOSIS — Z85828 Personal history of other malignant neoplasm of skin: Secondary | ICD-10-CM

## 2015-05-31 DIAGNOSIS — R197 Diarrhea, unspecified: Secondary | ICD-10-CM

## 2015-05-31 DIAGNOSIS — Z66 Do not resuscitate: Secondary | ICD-10-CM | POA: Diagnosis present

## 2015-05-31 DIAGNOSIS — Z792 Long term (current) use of antibiotics: Secondary | ICD-10-CM | POA: Diagnosis not present

## 2015-05-31 DIAGNOSIS — Z7902 Long term (current) use of antithrombotics/antiplatelets: Secondary | ICD-10-CM | POA: Diagnosis not present

## 2015-05-31 DIAGNOSIS — K922 Gastrointestinal hemorrhage, unspecified: Secondary | ICD-10-CM

## 2015-05-31 DIAGNOSIS — F0390 Unspecified dementia without behavioral disturbance: Secondary | ICD-10-CM | POA: Diagnosis present

## 2015-05-31 DIAGNOSIS — E43 Unspecified severe protein-calorie malnutrition: Secondary | ICD-10-CM | POA: Diagnosis present

## 2015-05-31 DIAGNOSIS — A419 Sepsis, unspecified organism: Secondary | ICD-10-CM

## 2015-05-31 DIAGNOSIS — Z7982 Long term (current) use of aspirin: Secondary | ICD-10-CM

## 2015-05-31 DIAGNOSIS — E860 Dehydration: Secondary | ICD-10-CM | POA: Diagnosis present

## 2015-05-31 DIAGNOSIS — E1110 Type 2 diabetes mellitus with ketoacidosis without coma: Secondary | ICD-10-CM

## 2015-05-31 DIAGNOSIS — T380X5A Adverse effect of glucocorticoids and synthetic analogues, initial encounter: Secondary | ICD-10-CM | POA: Diagnosis present

## 2015-05-31 DIAGNOSIS — D122 Benign neoplasm of ascending colon: Secondary | ICD-10-CM | POA: Diagnosis present

## 2015-05-31 DIAGNOSIS — I2510 Atherosclerotic heart disease of native coronary artery without angina pectoris: Secondary | ICD-10-CM | POA: Diagnosis present

## 2015-05-31 DIAGNOSIS — K529 Noninfective gastroenteritis and colitis, unspecified: Secondary | ICD-10-CM

## 2015-05-31 DIAGNOSIS — Z6821 Body mass index (BMI) 21.0-21.9, adult: Secondary | ICD-10-CM

## 2015-05-31 DIAGNOSIS — D124 Benign neoplasm of descending colon: Secondary | ICD-10-CM | POA: Diagnosis present

## 2015-05-31 DIAGNOSIS — D123 Benign neoplasm of transverse colon: Secondary | ICD-10-CM | POA: Diagnosis present

## 2015-05-31 DIAGNOSIS — K621 Rectal polyp: Secondary | ICD-10-CM | POA: Diagnosis present

## 2015-05-31 DIAGNOSIS — E1310 Other specified diabetes mellitus with ketoacidosis without coma: Secondary | ICD-10-CM | POA: Diagnosis present

## 2015-05-31 DIAGNOSIS — N390 Urinary tract infection, site not specified: Secondary | ICD-10-CM | POA: Diagnosis present

## 2015-05-31 DIAGNOSIS — Z79899 Other long term (current) drug therapy: Secondary | ICD-10-CM

## 2015-05-31 DIAGNOSIS — L89602 Pressure ulcer of unspecified heel, stage 2: Secondary | ICD-10-CM | POA: Diagnosis present

## 2015-05-31 DIAGNOSIS — L8990 Pressure ulcer of unspecified site, unspecified stage: Secondary | ICD-10-CM

## 2015-05-31 DIAGNOSIS — Z951 Presence of aortocoronary bypass graft: Secondary | ICD-10-CM

## 2015-05-31 DIAGNOSIS — K5190 Ulcerative colitis, unspecified, without complications: Secondary | ICD-10-CM

## 2015-05-31 HISTORY — DX: Atherosclerotic heart disease of native coronary artery without angina pectoris: I25.10

## 2015-05-31 LAB — COMPREHENSIVE METABOLIC PANEL
ALT: 11 U/L — ABNORMAL LOW (ref 14–54)
ANION GAP: 9 (ref 5–15)
AST: 25 U/L (ref 15–41)
Albumin: 2.8 g/dL — ABNORMAL LOW (ref 3.5–5.0)
Alkaline Phosphatase: 50 U/L (ref 38–126)
BUN: 13 mg/dL (ref 6–20)
CHLORIDE: 106 mmol/L (ref 101–111)
CO2: 19 mmol/L — ABNORMAL LOW (ref 22–32)
Calcium: 8.1 mg/dL — ABNORMAL LOW (ref 8.9–10.3)
Creatinine, Ser: 1.41 mg/dL — ABNORMAL HIGH (ref 0.44–1.00)
GFR, EST AFRICAN AMERICAN: 41 mL/min — AB (ref >60)
GFR, EST NON AFRICAN AMERICAN: 35 mL/min — AB (ref >60)
Glucose, Bld: 186 mg/dL — ABNORMAL HIGH (ref 65–99)
POTASSIUM: 4.2 mmol/L (ref 3.5–5.1)
Sodium: 134 mmol/L — ABNORMAL LOW (ref 135–145)
TOTAL PROTEIN: 7.3 g/dL (ref 6.5–8.1)
Total Bilirubin: 0.5 mg/dL (ref 0.3–1.2)

## 2015-05-31 LAB — CBC WITH DIFFERENTIAL/PLATELET
BASOS ABS: 0.1 10*3/uL (ref 0–0.1)
Basophils Relative: 1 %
Eosinophils Absolute: 0.1 10*3/uL (ref 0–0.7)
Eosinophils Relative: 1 %
HEMATOCRIT: 32.5 % — AB (ref 35.0–47.0)
HEMOGLOBIN: 10 g/dL — AB (ref 12.0–16.0)
LYMPHS ABS: 0.8 10*3/uL — AB (ref 1.0–3.6)
LYMPHS PCT: 7 %
MCH: 22.9 pg — AB (ref 26.0–34.0)
MCHC: 30.9 g/dL — ABNORMAL LOW (ref 32.0–36.0)
MCV: 74.1 fL — AB (ref 80.0–100.0)
Monocytes Absolute: 0.7 10*3/uL (ref 0.2–0.9)
Monocytes Relative: 6 %
NEUTROS ABS: 9.9 10*3/uL — AB (ref 1.4–6.5)
NEUTROS PCT: 85 %
Platelets: 615 10*3/uL — ABNORMAL HIGH (ref 150–440)
RBC: 4.38 MIL/uL (ref 3.80–5.20)
RDW: 18.4 % — ABNORMAL HIGH (ref 11.5–14.5)
WBC: 11.5 10*3/uL — AB (ref 3.6–11.0)

## 2015-05-31 LAB — CBC
HEMATOCRIT: 28.3 % — AB (ref 35.0–47.0)
Hemoglobin: 8.8 g/dL — ABNORMAL LOW (ref 12.0–16.0)
MCH: 23 pg — AB (ref 26.0–34.0)
MCHC: 31.1 g/dL — AB (ref 32.0–36.0)
MCV: 74 fL — AB (ref 80.0–100.0)
PLATELETS: 454 10*3/uL — AB (ref 150–440)
RBC: 3.82 MIL/uL (ref 3.80–5.20)
RDW: 17.8 % — AB (ref 11.5–14.5)
WBC: 8.7 10*3/uL (ref 3.6–11.0)

## 2015-05-31 LAB — URINALYSIS COMPLETE WITH MICROSCOPIC (ARMC ONLY)
BILIRUBIN URINE: NEGATIVE
GLUCOSE, UA: NEGATIVE mg/dL
HGB URINE DIPSTICK: NEGATIVE
KETONES UR: NEGATIVE mg/dL
Nitrite: NEGATIVE
PH: 5 (ref 5.0–8.0)
Protein, ur: NEGATIVE mg/dL
Specific Gravity, Urine: 1.057 — ABNORMAL HIGH (ref 1.005–1.030)

## 2015-05-31 LAB — GLUCOSE, CAPILLARY: Glucose-Capillary: 138 mg/dL — ABNORMAL HIGH (ref 65–99)

## 2015-05-31 LAB — CREATININE, SERUM
Creatinine, Ser: 1.22 mg/dL — ABNORMAL HIGH (ref 0.44–1.00)
GFR calc Af Amer: 49 mL/min — ABNORMAL LOW (ref >60)
GFR calc non Af Amer: 42 mL/min — ABNORMAL LOW (ref >60)

## 2015-05-31 LAB — LACTIC ACID, PLASMA
Lactic Acid, Venous: 2.5 mmol/L (ref 0.5–2.0)
Lactic Acid, Venous: 1.3 mmol/L (ref 0.5–2.0)

## 2015-05-31 LAB — CMV (CYTOMEGALOVIRUS) DNA ULTRAQUANT, PCR
CMV DNA Quant: POSITIVE IU/mL
LOG10 CMV QN DNA PL: UNDETERMINED {Log_IU}/mL

## 2015-05-31 LAB — TROPONIN I

## 2015-05-31 LAB — LIPASE, BLOOD: LIPASE: 15 U/L — AB (ref 22–51)

## 2015-05-31 MED ORDER — PIPERACILLIN-TAZOBACTAM 3.375 G IVPB 30 MIN
3.3750 g | Freq: Once | INTRAVENOUS | Status: AC
  Administered 2015-05-31: 3.375 g via INTRAVENOUS
  Filled 2015-05-31: qty 50

## 2015-05-31 MED ORDER — IOHEXOL 300 MG/ML  SOLN
80.0000 mL | Freq: Once | INTRAMUSCULAR | Status: AC | PRN
  Administered 2015-05-31: 80 mL via INTRAVENOUS

## 2015-05-31 MED ORDER — IOHEXOL 300 MG/ML  SOLN
25.0000 mL | Freq: Once | INTRAMUSCULAR | Status: AC | PRN
Start: 2015-05-31 — End: 2015-05-31
  Administered 2015-05-31: 25 mL via ORAL

## 2015-05-31 MED ORDER — ADALIMUMAB 40 MG/0.8ML ~~LOC~~ AJKT: 40.0000 mg | AUTO-INJECTOR | SUBCUTANEOUS | Status: DC

## 2015-05-31 MED ORDER — FERROUS SULFATE 325 (65 FE) MG PO TABS
325.0000 mg | ORAL_TABLET | Freq: Every day | ORAL | Status: DC
  Administered 2015-05-31 – 2015-06-07 (×8): 325 mg via ORAL
  Filled 2015-05-31 (×8): qty 1

## 2015-05-31 MED ORDER — INSULIN ASPART 100 UNIT/ML ~~LOC~~ SOLN
0.0000 [IU] | Freq: Three times a day (TID) | SUBCUTANEOUS | Status: DC
  Administered 2015-06-01: 3 [IU] via SUBCUTANEOUS
  Administered 2015-06-02: 2 [IU] via SUBCUTANEOUS
  Administered 2015-06-02: 3 [IU] via SUBCUTANEOUS
  Administered 2015-06-03: 2 [IU] via SUBCUTANEOUS
  Administered 2015-06-03: 1 [IU] via SUBCUTANEOUS
  Administered 2015-06-03: 3 [IU] via SUBCUTANEOUS
  Administered 2015-06-04 (×2): 7 [IU] via SUBCUTANEOUS
  Administered 2015-06-04: 5 [IU] via SUBCUTANEOUS
  Administered 2015-06-05: 17:00:00 9 [IU] via SUBCUTANEOUS
  Administered 2015-06-05: 09:00:00 5 [IU] via SUBCUTANEOUS
  Administered 2015-06-05 – 2015-06-06 (×4): 9 [IU] via SUBCUTANEOUS
  Administered 2015-06-07: 5 [IU] via SUBCUTANEOUS
  Filled 2015-05-31: qty 9
  Filled 2015-05-31: qty 3
  Filled 2015-05-31: qty 7
  Filled 2015-05-31: qty 5
  Filled 2015-05-31: qty 2
  Filled 2015-05-31: qty 9
  Filled 2015-05-31: qty 7
  Filled 2015-05-31: qty 9
  Filled 2015-05-31: qty 1
  Filled 2015-05-31: qty 2
  Filled 2015-05-31: qty 3
  Filled 2015-05-31: qty 9
  Filled 2015-05-31: qty 5
  Filled 2015-05-31: qty 4
  Filled 2015-05-31: qty 3
  Filled 2015-05-31: qty 5
  Filled 2015-05-31: qty 9

## 2015-05-31 MED ORDER — PRAVASTATIN SODIUM 20 MG PO TABS
80.0000 mg | ORAL_TABLET | Freq: Every day | ORAL | Status: DC
  Administered 2015-05-31 – 2015-06-09 (×10): 80 mg via ORAL
  Filled 2015-05-31 (×10): qty 4

## 2015-05-31 MED ORDER — DEXTROSE 5 % IV SOLN
1.0000 g | INTRAVENOUS | Status: DC
  Administered 2015-05-31 – 2015-06-03 (×4): 1 g via INTRAVENOUS
  Filled 2015-05-31 (×5): qty 10

## 2015-05-31 MED ORDER — SODIUM CHLORIDE 0.9 % IV BOLUS (SEPSIS)
1000.0000 mL | INTRAVENOUS | Status: AC
  Administered 2015-05-31: 1000 mL via INTRAVENOUS

## 2015-05-31 MED ORDER — ONDANSETRON HCL 4 MG/2ML IJ SOLN
4.0000 mg | Freq: Once | INTRAMUSCULAR | Status: AC
  Administered 2015-05-31: 4 mg via INTRAVENOUS

## 2015-05-31 MED ORDER — MELATONIN 3 MG PO TABS: 3.0000 mg | ORAL_TABLET | Freq: Every day | ORAL | Status: DC

## 2015-05-31 MED ORDER — OXYCODONE HCL 5 MG PO TABS
5.0000 mg | ORAL_TABLET | ORAL | Status: DC | PRN
  Administered 2015-05-31 – 2015-06-09 (×12): 5 mg via ORAL
  Filled 2015-05-31 (×12): qty 1

## 2015-05-31 MED ORDER — SODIUM CHLORIDE 0.9 % IV SOLN
INTRAVENOUS | Status: AC
  Administered 2015-05-31 – 2015-06-01 (×2): via INTRAVENOUS

## 2015-05-31 MED ORDER — VITAMIN D (ERGOCALCIFEROL) 1.25 MG (50000 UNIT) PO CAPS
50000.0000 [IU] | ORAL_CAPSULE | ORAL | Status: DC
  Administered 2015-06-05: 50000 [IU] via ORAL
  Filled 2015-05-31: qty 1

## 2015-05-31 MED ORDER — HEPARIN SODIUM (PORCINE) 5000 UNIT/ML IJ SOLN
5000.0000 [IU] | Freq: Three times a day (TID) | INTRAMUSCULAR | Status: DC
  Administered 2015-05-31 – 2015-06-02 (×7): 5000 [IU] via SUBCUTANEOUS
  Filled 2015-05-31 (×7): qty 1

## 2015-05-31 MED ORDER — LOPERAMIDE HCL 2 MG PO CAPS
2.0000 mg | ORAL_CAPSULE | Freq: Two times a day (BID) | ORAL | Status: DC
  Administered 2015-05-31 – 2015-06-10 (×19): 2 mg via ORAL
  Filled 2015-05-31 (×19): qty 1

## 2015-05-31 MED ORDER — INSULIN GLARGINE 100 UNIT/ML ~~LOC~~ SOLN
26.0000 [IU] | Freq: Every day | SUBCUTANEOUS | Status: DC
  Administered 2015-06-01: 26 [IU] via SUBCUTANEOUS
  Filled 2015-05-31 (×3): qty 0.26

## 2015-05-31 MED ORDER — LEVOTHYROXINE SODIUM 50 MCG PO TABS
25.0000 ug | ORAL_TABLET | Freq: Every day | ORAL | Status: DC
  Administered 2015-06-01 – 2015-06-10 (×10): 25 ug via ORAL
  Filled 2015-05-31 (×11): qty 1

## 2015-05-31 MED ORDER — ACETAMINOPHEN 325 MG PO TABS
650.0000 mg | ORAL_TABLET | Freq: Four times a day (QID) | ORAL | Status: DC | PRN
  Filled 2015-05-31: qty 2

## 2015-05-31 MED ORDER — CLOPIDOGREL BISULFATE 75 MG PO TABS
75.0000 mg | ORAL_TABLET | Freq: Every day | ORAL | Status: DC
  Administered 2015-06-01 – 2015-06-02 (×2): 75 mg via ORAL
  Filled 2015-05-31 (×2): qty 1

## 2015-05-31 MED ORDER — METRONIDAZOLE 500 MG PO TABS
500.0000 mg | ORAL_TABLET | Freq: Two times a day (BID) | ORAL | Status: DC
  Administered 2015-06-01 – 2015-06-02 (×3): 500 mg via ORAL
  Filled 2015-05-31 (×4): qty 1

## 2015-05-31 MED ORDER — SERTRALINE HCL 50 MG PO TABS
50.0000 mg | ORAL_TABLET | Freq: Every day | ORAL | Status: DC
  Administered 2015-06-01 – 2015-06-10 (×10): 50 mg via ORAL
  Filled 2015-05-31 (×10): qty 1

## 2015-05-31 MED ORDER — MORPHINE SULFATE (PF) 2 MG/ML IV SOLN
2.0000 mg | INTRAVENOUS | Status: DC | PRN
  Administered 2015-06-02 – 2015-06-04 (×2): 2 mg via INTRAVENOUS
  Filled 2015-05-31 (×3): qty 1

## 2015-05-31 MED ORDER — ASPIRIN 81 MG PO CHEW
81.0000 mg | CHEWABLE_TABLET | Freq: Every day | ORAL | Status: DC
  Administered 2015-06-01 – 2015-06-02 (×2): 81 mg via ORAL
  Filled 2015-05-31 (×2): qty 1

## 2015-05-31 MED ORDER — ONDANSETRON HCL 4 MG/2ML IJ SOLN
INTRAMUSCULAR | Status: AC
  Filled 2015-05-31: qty 2

## 2015-05-31 MED ORDER — MESALAMINE 400 MG PO CPDR
800.0000 mg | DELAYED_RELEASE_CAPSULE | Freq: Three times a day (TID) | ORAL | Status: DC
  Administered 2015-05-31 – 2015-06-10 (×27): 800 mg via ORAL
  Filled 2015-05-31 (×29): qty 2

## 2015-05-31 MED ORDER — PANTOPRAZOLE SODIUM 40 MG PO TBEC
40.0000 mg | DELAYED_RELEASE_TABLET | Freq: Every day | ORAL | Status: DC
  Administered 2015-06-02: 40 mg via ORAL
  Filled 2015-05-31: qty 1

## 2015-05-31 MED ORDER — ALENDRONATE SODIUM 70 MG PO TABS: 70.0000 mg | ORAL_TABLET | ORAL | Status: DC

## 2015-05-31 MED ORDER — PREDNISONE 20 MG PO TABS
10.0000 mg | ORAL_TABLET | ORAL | Status: DC
  Administered 2015-06-01: 10 mg via ORAL
  Filled 2015-05-31: qty 2

## 2015-05-31 MED ORDER — MESALAMINE 800 MG PO TBEC: 800.0000 mg | DELAYED_RELEASE_TABLET | Freq: Three times a day (TID) | ORAL | Status: DC

## 2015-05-31 NOTE — H&P (Signed)
[facility] [place]: Bethany Russell    MR#:  [id number]  DATE OF BIRTH:  Feb 16, 1941  DATE OF ADMISSION:  05/31/2015  PRIMARY CARE PHYSICIAN: Ezequiel Kayser, MD   REQUESTING/REFERRING PHYSICIAN: [place]  CHIEF COMPLAINT:   Chief Complaint  Patient presents with  . Fever    HISTORY OF PRESENT ILLNESS: Bethany Russell  is a 74 y.o. female with a known history of ulcerative colitis, hypertension, diabetes without complication, skin cancer, coronary artery disease- had fever and symptoms of UTI so came to emergency room 5 days ago and after tested positive urinalysis she was sent home with oral ciprofloxacin prescriptions. She will follow with Dr. Vira Agar for ulcerative colitis after 2 days in office, he check urine culture report and he notices that her urine is growing Escherichia coli which is resistant to Cipro so he changed to [place]. She just Macrobid last night and today morning but overall her condition is getting worse, she is more weakness dizziness and nausea today so daughters decided to bring her back to emergency room after talking to Dr. Vira Agar on phone. She is noted to have tachycardia, fever and elevated white cell count  PAST MEDICAL HISTORY:   Past Medical History  Diagnosis Date  . Ulcerative colitis   . Hypertension   . Diabetes mellitus without complication   . Cancer     skin cancer  . Coronary artery disease     PAST SURGICAL HISTORY:  Past Surgical History  Procedure Laterality Date  . Coronary artery bypass graft    . Skin biopsy      SOCIAL HISTORY:  Social History  Substance Use Topics  . Smoking status: Never Smoker   . Smokeless tobacco: Not on file  . Alcohol Use: No    FAMILY HISTORY:  Family History  Problem Relation Age of Onset  . CAD Mother   . CAD Father   . Colon cancer Brother     DRUG ALLERGIES: No Known Allergies  REVIEW OF SYSTEMS:   CONSTITUTIONAL: positive for  fever, fatigue or weakness.  EYES: No blurred or double vision.  EARS, NOSE, AND THROAT: No tinnitus or ear pain.  RESPIRATORY: No cough, shortness of breath, wheezing or hemoptysis.  CARDIOVASCULAR: No chest pain, orthopnea, edema.  GASTROINTESTINAL: No nausea, vomiting,positive for chronic diarrhea and mild abdominal pain.  GENITOURINARY: No dysuria, hematuria.  ENDOCRINE: No polyuria, nocturia,  HEMATOLOGY: No anemia, easy bruising or bleeding SKIN: No rash or lesion. MUSCULOSKELETAL: No joint pain or arthritis.   NEUROLOGIC: No tingling, numbness, weakness.  PSYCHIATRY: No anxiety or depression.   MEDICATIONS AT HOME:  Prior to Admission medications   Medication Sig Start Date End Date Taking? Authorizing Provider  acetaminophen-codeine (TYLENOL #3) 300-30 MG per tablet Take 1 tablet by mouth 3 (three) times daily.    Yes Historical Provider, MD  alendronate (FOSAMAX) 70 MG tablet Take 70 mg by mouth once a week. Pt takes on Sunday.   Yes Historical Provider, MD  aspirin 81 MG chewable tablet Chew 1 tablet by mouth daily.   Yes Historical Provider, MD  clopidogrel (PLAVIX) 75 MG tablet Take 75 mg by mouth daily.    Yes Historical Provider, MD  diclofenac sodium (VOLTAREN) 1 % GEL Apply 2 g topically 4 (four) times daily as needed (for pain).    Yes Historical Provider, MD  ferrous sulfate 325 (65 FE) MG tablet Take 325 mg by mouth at bedtime.  Yes Historical Provider, MD  HUMIRA PEN 40 MG/0.8ML PNKT Inject 40 mg into the skin every 14 (fourteen) days. Pt uses on Tuesday.   Yes Historical Provider, MD  insulin glargine (LANTUS) 100 UNIT/ML injection Inject 26 Units into the skin daily.   Yes Historical Provider, MD  insulin lispro (HUMALOG) 100 UNIT/ML injection Inject 8-16 Units into the skin 3 (three) times daily as needed for high blood sugar. Pt uses per sliding scale at every meal.   Yes Historical Provider, MD  levothyroxine (SYNTHROID, LEVOTHROID) 25 MCG tablet Take 25 mcg by  mouth daily.    Yes Historical Provider, MD  loperamide (IMODIUM) 2 MG capsule Take 2 mg by mouth 2 (two) times daily.    Yes Historical Provider, MD  Melatonin 3 MG TABS Take 3 mg by mouth at bedtime.    Yes Historical Provider, MD  Mesalamine (ASACOL HD) 800 MG TBEC Take 800 mg by mouth 3 (three) times daily.    Yes Historical Provider, MD  metoprolol succinate (TOPROL-XL) 25 MG 24 hr tablet Take 25 mg by mouth daily.   Yes Historical Provider, MD  metroNIDAZOLE (FLAGYL) 500 MG tablet Take 1 tablet (500 mg total) by mouth 2 (two) times daily after a meal. 05/26/15  Yes Lavonia Drafts, MD  nitrofurantoin, macrocrystal-monohydrate, (MACROBID) 100 MG capsule Take 100 mg by mouth 2 (two) times daily. 05/30/15 06/06/15 Yes Historical Provider, MD  pantoprazole (PROTONIX) 40 MG tablet Take 40 mg by mouth daily.    Yes Historical Provider, MD  pravastatin (PRAVACHOL) 80 MG tablet Take 80 mg by mouth at bedtime.    Yes Historical Provider, MD  predniSONE (DELTASONE) 10 MG tablet Take 10 mg by mouth every other day.   Yes Historical Provider, MD  sertraline (ZOLOFT) 50 MG tablet Take 50 mg by mouth daily.    Yes Historical Provider, MD  Vitamin D, Ergocalciferol, (DRISDOL) 50000 UNITS CAPS capsule Take 50,000 Units by mouth once a week. Pt takes on Sunday.   Yes Historical Provider, MD  ciprofloxacin (CIPRO) 500 MG tablet Take 1 tablet (500 mg total) by mouth 2 (two) times daily. Patient not taking: Reported on 05/31/2015 05/26/15   Lavonia Drafts, MD      PHYSICAL EXAMINATION:   VITAL SIGNS: Blood pressure 103/82, pulse 106, temperature 102.8 F (39.3 C), temperature source Oral, resp. rate 16, height 5\' 4"  (1.626 m), weight 59.875 kg (132 lb), SpO2 98 %.  GENERAL:  74 y.o.-year-old patient lying in the bed with no acute distress.  EYES: Pupils equal, round, reactive to light and accommodation. No scleral icterus. Extraocular muscles intact.  HEENT: Head atraumatic, normocephalic. Oropharynx and  nasopharynx clear. Mild hearing deficit. NECK:  Supple, no jugular venous distention. No thyroid enlargement, no tenderness.  LUNGS: Normal breath sounds bilaterally, no wheezing, rales,rhonchi or crepitation. No use of accessory muscles of respiration.  CARDIOVASCULAR: S1, S2 normal. No murmurs, rubs, or gallops.  ABDOMEN: Soft, nontender, nondistended. Bowel sounds present. No organomegaly or mass.  EXTREMITIES: No pedal edema, cyanosis, or clubbing.  NEUROLOGIC: Cranial nerves II through XII are intact. Muscle strength 5/5 in all extremities. Sensation intact. Gait not checked.  PSYCHIATRIC: The patient is alert and oriented x 3.  SKIN: No obvious rash, lesion, or ulcer.   LABORATORY PANEL:   CBC  Recent Labs Lab 05/26/15 1126 05/31/15 1544  WBC 9.2 11.5*  HGB 10.2* 10.0*  HCT 32.4* 32.5*  PLT 466* 615*  MCV 73.4* 74.1*  MCH 23.2* 22.9*  MCHC 31.6*  30.9*  RDW 17.6* 18.4*  LYMPHSABS  --  0.8*  MONOABS  --  0.7  EOSABS  --  0.1  BASOSABS  --  0.1   ------------------------------------------------------------------------------------------------------------------  Chemistries   Recent Labs Lab 05/26/15 1126 05/31/15 1544  NA 134* 134*  K 4.1 4.2  CL 103 106  CO2 22 19*  GLUCOSE 111* 186*  BUN 20 13  CREATININE 1.41* 1.41*  CALCIUM 8.6* 8.1*  AST 24 25  ALT 13* 11*  ALKPHOS 47 50  BILITOT 0.6 0.5   ------------------------------------------------------------------------------------------------------------------ estimated creatinine clearance is 29.3 mL/min (by C-G formula based on Cr of 1.41). ------------------------------------------------------------------------------------------------------------------ No results for input(s): TSH, T4TOTAL, T3FREE, THYROIDAB in the last 72 hours.  Invalid input(s): FREET3   Coagulation profile No results for input(s): INR, PROTIME in the last 168  hours. ------------------------------------------------------------------------------------------------------------------- No results for input(s): DDIMER in the last 72 hours. -------------------------------------------------------------------------------------------------------------------  Cardiac Enzymes  Recent Labs Lab 05/31/15 1544  TROPONINI <0.03   ------------------------------------------------------------------------------------------------------------------ Invalid input(s): POCBNP  ---------------------------------------------------------------------------------------------------------------  Urinalysis    Component Value Date/Time   COLORURINE YELLOW* 05/26/2015 1522   COLORURINE Yellow 02/25/2013 1337   APPEARANCEUR CLEAR* 05/26/2015 1522   APPEARANCEUR Hazy 02/25/2013 1337   LABSPEC 1.017 05/26/2015 1522   LABSPEC 1.010 02/25/2013 1337   PHURINE 5.0 05/26/2015 1522   PHURINE 6.0 02/25/2013 1337   GLUCOSEU NEGATIVE 05/26/2015 1522   GLUCOSEU Negative 02/25/2013 1337   HGBUR NEGATIVE 05/26/2015 1522   HGBUR Negative 02/25/2013 1337   BILIRUBINUR NEGATIVE 05/26/2015 1522   BILIRUBINUR Negative 02/25/2013 1337   KETONESUR NEGATIVE 05/26/2015 1522   KETONESUR 1+ 02/25/2013 1337   PROTEINUR NEGATIVE 05/26/2015 1522   PROTEINUR Negative 02/25/2013 1337   NITRITE POSITIVE* 05/26/2015 1522   NITRITE Positive 02/25/2013 1337   LEUKOCYTESUR TRACE* 05/26/2015 1522   LEUKOCYTESUR 3+ 02/25/2013 1337     RADIOLOGY: Ct Abdomen Pelvis W Contrast  05/31/2015   ADDENDUM REPORT: 05/31/2015 17:40  ADDENDUM: Correction, Impression #1 should read  "1.  SUGGESTION of mild colitis."  Study discussed by telephone with Dr. Larae Grooms on 05/31/2015 at 1721 hrs.   Electronically Signed   By: Genevie Ann M.D.   On: 05/31/2015 17:40   05/31/2015   CLINICAL DATA:  74 year old female with diffuse abdominal pain and fever for 2 weeks. Initial encounter.  EXAM: CT ABDOMEN AND PELVIS  WITH CONTRAST  TECHNIQUE: Multidetector CT imaging of the abdomen and pelvis was performed using the standard protocol following bolus administration of intravenous contrast.  CONTRAST:  47mL OMNIPAQUE IOHEXOL 300 MG/ML  SOLN  COMPARISON:  CT Abdomen and Pelvis 10/07/2013.  FINDINGS: Motion artifact at the lung bases. Cardiomegaly appears mildly progressed since last year. No pericardial effusion. Sequelae of median sternotomy. Calcified Coronary artery atherosclerosis. New mostly dependent patchy in ground-glass opacity at both lung bases. No pleural effusion.  Scoliosis. Widespread degenerative changes in the spine. Enthesophytosis at the pelvis. No acute osseous abnormality identified.  No pelvic free fluid. Negative uterus and adnexa. Diminutive urinary bladder. Decompressed rectum.  Suggestion of mild rectal and distal sigmoid colon wall thickening, although these segments are decompressed. Questionable associated mesenteric stranding. The proximal sigmoid is tortuous, gas distended, and appears within normal limits. Short-segment left colon. Decompressed sigmoid colon where of the bowel wall again appears somewhat featureless and mildly thickened. The more proximal transverse colon has a normal appearance. Diminutive right colon. Oral contrast has reached the mid transverse. Normal retrocecal appendix. Negative terminal ileum. No pericecal inflammation. No dilated or inflamed small  bowel loops. However, the duodenum is not normally rotated, does not have a retroperitoneal course and does not extend underneath the SMA. Nearly all of the small bowel is located in the right abdomen. Negative stomach.  Mild thickening of the gallbladder wall appears similar to that 2015. No pericholecystic inflammation. Similar mild intrahepatic biliary ductal prominence. Likewise, the CBD is prominent measuring up to 10 mm in diameter but has not significantly changed (9 mm in 2015). The pancreas is atrophied. No main pancreatic  ductal dilatation. No abnormal enhancement of the liver. Adrenal glands appear stable and negative. The portal venous system is patent major arterial structures in the abdomen and pelvis.  Aortoiliac calcified atherosclerosis noted. Major arterial structures in the abdomen and pelvis, including the main splenic artery, appear patent.  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 Enhancement of the spleen elsewhere is within normal limits. No abdominal free fluid.  Both kidneys appear stable, with no interval renal infarct identified. Renal enhancement and contrast excretion appears symmetric. Both ureters do appear larger today than previously, but no bladder mass or obstructing lesion is identified. No lymphadenopathy.  Along the ventral abdominal wall there are chronic postinflammatory areas in the subcutaneous fat (e.g. Series 2, image 60) which appear stable. This might be related to chronic subcutaneous injection sites.  IMPRESSION: 1. No suggestion of mild colitis involving the splenic flexure and also the distal sigmoid colon and rectum. No associated free fluid or complicating features. A more pronounced colitis was noted in 2015 involving much of the same colonic segments. 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 There is chronic widespread aortic calcified atherosclerosis, but no major arterial branch occlusion is identified. 3. Congenital midgut malrotation. No small bowel obstruction or inflammation. 4. Stable appearance of the gallbladder and biliary tree with mild chronic intra and extrahepatic ductal dilatation. Chronic pancreatic atrophy. 5. Dependent opacity at both lung bases, favor atelectasis over aspiration or pneumonia. 6. Cardiomegaly, mildly progressed.  Electronically Signed: By: Genevie Ann M.D. On: 05/31/2015 17:15    IMPRESSION AND PLAN:  * Sepsis with  UTI  This is evident by tachycardia and fever, elevated white cell count.  Urine culture report reviewed from 4 days ago, I will give IV Rocephin.  * Ulcerative colitis and chronic diarrhea with that  Continue home medications for now.  * CAD  Continue home meds  * Hyperlipidemia  Continue statin.  * Diabetes  Continue basal insulin, give sliding scale coverage.  All the records are reviewed and case discussed with ED provider. Management plans discussed with the patient, family and they are in agreement.  CODE STATUS: DNR.   TOTAL TIME TAKING CARE OF THIS PATIENT: 50 minutes.    Vaughan Basta M.D on 05/31/2015   Between 7am to 6pm - Pager - [phone]  After 6pm go to [email or web address] - password [email or web address] [facility]  Office  [phone]  CC: Primary care physician; Ezequiel Kayser, MD

## 2015-05-31 NOTE — Progress Notes (Signed)

## 2015-05-31 NOTE — ED Notes (Signed)
Pt recently seen and treated for ecoli in her urine, started on antibiotics and thinks she might be having an allergic reaction to meds.

## 2015-05-31 NOTE — Progress Notes (Signed)

## 2015-05-31 NOTE — Progress Notes (Signed)
Spoke to patient's daughter regarding Escherichia coli in urine resistant to antibiotics prescribed on 8/25. Patient's daughter stated that subsequent to the ED visit, the patient followed up with Dr. Tiffany Kocher outpatient who prescribed nitrofurantoin to treat the Escherichia coli  but patient seemed to be having a reaction to it. Informed daughter that Dr. Meade Maw authorized cefpodoxime 100 mg po bid x 7 days on 05/30/15. Daughter wants to return patient to ER as per Dr. Edd Fabian instructions today instead of getting cefpodoxme called into pharmacy.

## 2015-05-31 NOTE — ED Provider Notes (Addendum)
[facility] Emergency Department Provider Note  ____________________________________________  Time seen: Approximately 330PM  I have reviewed the triage vital signs and the nursing notes.   HISTORY  Chief Complaint Fever    HPI [place] Alasha Mcguinness is a 74 y.o. female ulcerative colitis was recently diagnosed with urinary tract infection. She was initially started on Cipro and Flagyl and discharged home to follow up with GI. However, her cultures came back resistant to ciprofloxacin but sensitive to Macrobid. She has had 2 doses of Macrobid at this time but is since developed a fever with nausea and dry heaves. The diarrhea continues but there is no blood in her stool. The patient was diagnosed with ulcerative colitis in 2013 and has never had a bowel resection. Had a visit to the ER several days ago with a negative C. difficile. Denying any pain or nausea at this time.The patient is having about 8 episodes of diarrhea per day. This is her baseline.   Past Medical History  Diagnosis Date  . Ulcerative colitis   . Hypertension   . Diabetes mellitus without complication   . Cancer     skin cancer    There are no active problems to display for this patient.   Past Surgical History  Procedure Laterality Date  . Coronary artery bypass graft    . Skin biopsy      Current Outpatient Rx  Name  Route  Sig  Dispense  Refill  . acetaminophen (TYLENOL) 500 MG tablet   Oral   Take 500 mg by mouth every 6 (six) hours as needed for mild pain.         Marland Kitchen acetaminophen-codeine (TYLENOL #3) 300-30 MG per tablet   Oral   Take 1 tablet by mouth 3 (three) times daily.          Marland Kitchen aspirin 81 MG chewable tablet   Oral   Chew 1 tablet by mouth daily.         Marland Kitchen levothyroxine (SYNTHROID, LEVOTHROID) 25 MCG tablet   Oral   Take 25 mcg by mouth daily.          Marland Kitchen loperamide (IMODIUM) 2 MG capsule   Oral   Take 2 mg by mouth daily.         . metoprolol  succinate (TOPROL-XL) 25 MG 24 hr tablet   Oral   Take 25 mg by mouth daily.         Marland Kitchen alendronate (FOSAMAX) 70 MG tablet   Oral   Take 1 tablet by mouth once a week.         . ciprofloxacin (CIPRO) 500 MG tablet   Oral   Take 1 tablet (500 mg total) by mouth 2 (two) times daily.   14 tablet   0   . clopidogrel (PLAVIX) 75 MG tablet   Oral   Take 1 tablet by mouth daily.         . diclofenac sodium (VOLTAREN) 1 % GEL   Topical   Apply 1 application topically 4 (four) times daily as needed.         . ferrous sulfate 325 (65 FE) MG tablet   Oral   Take 325 mg by mouth daily.         Marland Kitchen HUMALOG KWIKPEN 100 UNIT/ML KiwkPen   Subcutaneous   Inject 8-16 Units into the skin 3 (three) times daily before meals.           Dispense as written.   Marland Kitchen  HUMIRA PEN 40 MG/0.8ML PNKT   Subcutaneous   Inject 40 mg into the skin every 14 (fourteen) days. 40mg  every two weeks on Tuesday.           Dispense as written.   Marland Kitchen LANTUS SOLOSTAR 100 UNIT/ML Solostar Pen   Subcutaneous   Inject 26 Units into the skin every morning. Between 7 a.m. & 8 a.m.           Dispense as written.   . Melatonin 3 MG TABS   Oral   Take 1 tablet by mouth at bedtime.         . Mesalamine (ASACOL HD) 800 MG TBEC   Oral   Take 1 tablet by mouth 3 (three) times daily.         . metroNIDAZOLE (FLAGYL) 500 MG tablet   Oral   Take 1 tablet (500 mg total) by mouth 2 (two) times daily after a meal.   14 tablet   0   . pantoprazole (PROTONIX) 40 MG tablet   Oral   Take 1 tablet by mouth daily.         . pravastatin (PRAVACHOL) 80 MG tablet   Oral   Take 1 tablet by mouth every evening.         . predniSONE (DELTASONE) 10 MG tablet   Oral   Take 10 mg by mouth every other day.         . sertraline (ZOLOFT) 50 MG tablet   Oral   Take 1 tablet by mouth daily.         . Vitamin D, Ergocalciferol, (DRISDOL) 50000 UNITS CAPS capsule   Oral   Take 1 capsule by mouth once a  week.           Allergies Review of patient's allergies indicates no known allergies.  No family history on file.  Social History Social History  Substance Use Topics  . Smoking status: Never Smoker   . Smokeless tobacco: None  . Alcohol Use: No    Review of Systems Constitutional: No fever/chills Eyes: No visual changes. ENT: No sore throat. Cardiovascular: Denies chest pain. Respiratory: Denies shortness of breath. Gastrointestinal:  No constipation. Genitourinary: Negative for dysuria. Musculoskeletal: Negative for back pain. Skin: Negative for rash. Neurological: Negative for headaches, focal weakness or numbness.  10-point ROS otherwise negative.  ____________________________________________   PHYSICAL EXAM:  VITAL SIGNS: ED Triage Vitals  Enc Vitals Group     BP 05/31/15 1523 136/109 mmHg     Pulse Rate 05/31/15 1523 119     Resp 05/31/15 1523 18     Temp 05/31/15 1523 102.8 F (39.3 C)     Temp Source 05/31/15 1523 Oral     SpO2 05/31/15 1523 97 %     Weight 05/31/15 1523 132 lb (59.875 kg)     Height 05/31/15 1523 5\' 4"  (1.626 m)     Head Cir --      Peak Flow --      Pain Score --      Pain Loc --      Pain Edu? --      Excl. in [place]? --     Constitutional: Alert and oriented. Well appearing and in no acute distress. Eyes: Conjunctivae are normal. PERRL. EOMI. Head: Atraumatic. Nose: No congestion/rhinnorhea. Mouth/Throat: Mucous membranes are moist.  Oropharynx non-erythematous. Neck: No stridor.   Cardiovascular: Normal rate, regular rhythm. Grossly normal heart sounds.  Good peripheral circulation. Respiratory: Normal respiratory  effort.  No retractions. Lungs CTAB. Gastrointestinal: Soft with diffuse tenderness without any rebound or guarding. No distention. No abdominal bruits. No CVA tenderness. Musculoskeletal: No lower extremity tenderness nor edema.  No joint effusions. Neurologic:  Normal speech and language. No gross focal  neurologic deficits are appreciated. No gait instability. Skin:  Skin is warm, dry and intact. No rash noted. Psychiatric: Mood and affect are normal. Speech and behavior are normal.  ____________________________________________   LABS (all labs ordered are listed, but only abnormal results are displayed)  Labs Reviewed  COMPREHENSIVE METABOLIC PANEL - Abnormal; Notable for the following:    Sodium 134 (*)    CO2 19 (*)    Glucose, Bld 186 (*)    Creatinine, Ser 1.41 (*)    Calcium 8.1 (*)    Albumin 2.8 (*)    ALT 11 (*)    GFR calc non Af Amer 35 (*)    GFR calc Af Amer 41 (*)    All other components within normal limits  CBC WITH DIFFERENTIAL/PLATELET - Abnormal; Notable for the following:    WBC 11.5 (*)    Hemoglobin 10.0 (*)    HCT 32.5 (*)    MCV 74.1 (*)    MCH 22.9 (*)    MCHC 30.9 (*)    RDW 18.4 (*)    Platelets 615 (*)    Neutro Abs 9.9 (*)    Lymphs Abs 0.8 (*)    All other components within normal limits  LACTIC ACID, PLASMA - Abnormal; Notable for the following:    Lactic Acid, Venous 2.5 (*)    All other components within normal limits  LIPASE, BLOOD - Abnormal; Notable for the following:    Lipase 15 (*)    All other components within normal limits  CULTURE, BLOOD (ROUTINE X 2)  CULTURE, BLOOD (ROUTINE X 2)  URINE CULTURE  TROPONIN I  LACTIC ACID, PLASMA  URINALYSIS COMPLETEWITH MICROSCOPIC ([facility] ONLY)   ____________________________________________  EKG  ED ECG REPORT I, Doran Stabler, the attending physician, personally viewed and interpreted this ECG.   Date: 05/31/2015  EKG Time: 1637  Rate: 90  Rhythm: normal sinus rhythm  Axis: Normal axis  Intervals:none  ST&T Change: T wave inversions in 3 as well as aVF. No  ST elevations or depressions. No change in his EKG from 10/07/2013.  ____________________________________________  RADIOLOGY  CAT scan with likely mild colitis and splenic infarct which is new from  2015. ____________________________________________   PROCEDURES    ____________________________________________   INITIAL IMPRESSION / ASSESSMENT AND PLAN / ED COURSE  Pertinent labs & imaging results that were available during my care of the patient were reviewed by me and considered in my medical decision making (see chart for details).  ----------------------------------------- 5:34 PM on 05/31/2015 -----------------------------------------  She is resting comfortably at this time and the heart rate has decreased to the low 100s. Updated the family as well as the patient about the CAT scan findings. I feel that the fever and sepsis are more likely related to the amniotic resistant urinary tract infection then the mild colitis. Splenic infarct is likely an incidental finding. Signed out to Dr. Anselm Jungling of the medicine service for admission. ____________________________________________   FINAL CLINICAL IMPRESSION(S) / ED DIAGNOSES  Acute sepsis likely secondary to urinary tract infection. Mild colitis. Return visit.     Orbie Pyo, MD 05/31/15 1736  CRITICAL CARE Performed by: Doran Stabler   Total critical care time: 72  minutes  Critical care time was exclusive of separately billable procedures and treating other patients.  Critical care was necessary to treat or prevent imminent or life-threatening deterioration.  Critical care was time spent personally by me on the following activities: development of treatment plan with patient and/or surrogate as well as nursing, discussions with consultants, evaluation of patient's response to treatment, examination of patient, obtaining history from patient or surrogate, ordering and performing treatments and interventions, ordering and review of laboratory studies, ordering and review of radiographic studies, pulse oximetry and re-evaluation of patient's condition.  Patient was made sepsis alert and sepsis  protocol was initiated.  Orbie Pyo, MD 05/31/15 [phone]

## 2015-06-01 DIAGNOSIS — E43 Unspecified severe protein-calorie malnutrition: Secondary | ICD-10-CM

## 2015-06-01 DIAGNOSIS — L8990 Pressure ulcer of unspecified site, unspecified stage: Secondary | ICD-10-CM

## 2015-06-01 LAB — GLUCOSE, CAPILLARY
GLUCOSE-CAPILLARY: 73 mg/dL (ref 65–99)
GLUCOSE-CAPILLARY: 102 mg/dL — AB (ref 65–99)
Glucose-Capillary: 201 mg/dL — ABNORMAL HIGH (ref 65–99)

## 2015-06-01 LAB — BASIC METABOLIC PANEL
Anion gap: 6 (ref 5–15)
BUN: 10 mg/dL (ref 6–20)
CALCIUM: 6.9 mg/dL — AB (ref 8.9–10.3)
CO2: 19 mmol/L — ABNORMAL LOW (ref 22–32)
CREATININE: 1.1 mg/dL — AB (ref 0.44–1.00)
Chloride: 107 mmol/L (ref 101–111)
GFR calc non Af Amer: 48 mL/min — ABNORMAL LOW (ref >60)
GFR, EST AFRICAN AMERICAN: 55 mL/min — AB (ref >60)
Glucose, Bld: 75 mg/dL (ref 65–99)
Potassium: 3.2 mmol/L — ABNORMAL LOW (ref 3.5–5.1)
SODIUM: 132 mmol/L — AB (ref 135–145)

## 2015-06-01 LAB — CBC
HCT: 25.5 % — ABNORMAL LOW (ref 35.0–47.0)
Hemoglobin: 8 g/dL — ABNORMAL LOW (ref 12.0–16.0)
MCH: 23 pg — ABNORMAL LOW (ref 26.0–34.0)
MCHC: 31.6 g/dL — ABNORMAL LOW (ref 32.0–36.0)
MCV: 72.8 fL — ABNORMAL LOW (ref 80.0–100.0)
PLATELETS: 417 10*3/uL (ref 150–440)
RBC: 3.5 MIL/uL — AB (ref 3.80–5.20)
RDW: 17.7 % — ABNORMAL HIGH (ref 11.5–14.5)
WBC: 6.8 10*3/uL (ref 3.6–11.0)

## 2015-06-01 MED ORDER — ONDANSETRON HCL 4 MG/2ML IJ SOLN
4.0000 mg | Freq: Four times a day (QID) | INTRAMUSCULAR | Status: DC | PRN
  Administered 2015-06-01 – 2015-06-02 (×3): 4 mg via INTRAVENOUS
  Filled 2015-06-01 (×3): qty 2

## 2015-06-01 MED ORDER — GLUCERNA SHAKE PO LIQD
237.0000 mL | Freq: Three times a day (TID) | ORAL | Status: DC
  Administered 2015-06-01 – 2015-06-07 (×19): 237 mL via ORAL

## 2015-06-01 MED ORDER — TRAZODONE HCL 50 MG PO TABS
25.0000 mg | ORAL_TABLET | Freq: Every evening | ORAL | Status: DC | PRN
  Administered 2015-06-01 – 2015-06-02 (×2): 25 mg via ORAL
  Filled 2015-06-01 (×2): qty 1

## 2015-06-01 NOTE — Progress Notes (Signed)
Initial Nutrition Assessment  DOCUMENTATION CODES:      INTERVENTION:   Meals and Snacks: Cater to patient preferences; RD took lunch order for family  Medical Food Supplement Therapy: will recommend Glucerna Shake po TID, each supplement provides 220 kcal and 10 grams of protein   NUTRITION DIAGNOSIS:   Inadequate oral intake related to acute illness, poor appetite as evidenced by per patient/family report.  GOAL:   Patient will meet greater than or equal to 90% of their needs  MONITOR:    (energy Intake, digestive System, Electrolyte and renal Profile, Anthropometrics, Skin)  REASON FOR ASSESSMENT:   Consult, Malnutrition Screening Tool Poor PO  ASSESSMENT:   Pt admitted with sepsis with UTI. Pt with h/o Ulcerative Colitis as well.   Past Medical History  Diagnosis Date  . Ulcerative colitis   . Hypertension   . Diabetes mellitus without complication   . Cancer     skin cancer  . Coronary artery disease     Diet Order:  Diet heart healthy/carb modified Room service appropriate?: Yes; Fluid consistency:: Thin    Current Nutrition: Per daughter pt ate very little to nothing this am. Recorded po intake 40%.   Food/Nutrition-Related History: Per daughter pt has had very poor po intake for the past 2 weeks secondary to ulcerative colitis flare and UTI with fevers. Daughter reports pt with horrible appetite and what she did eat seemed to 'go right through her.' Family reports trying to encourage fluid intake when able. Pt drinks/likes Glucerna PTA.    Medications: NS at 17mL/hr, prednisone, vitamin D, flagyl, imodium, lantus, ferrous sulfate  Electrolyte/Renal Profile and Glucose Profile:   Recent Labs Lab 05/26/15 1126 05/31/15 1544 05/31/15 2020 06/01/15 0657  NA 134* 134*  --  132*  K 4.1 4.2  --  3.2*  CL 103 106  --  107  CO2 22 19*  --  19*  BUN 20 13  --  10  CREATININE 1.41* 1.41* 1.22* 1.10*  CALCIUM 8.6* 8.1*  --  6.9*  GLUCOSE 111* 186*  --   75   Protein Profile:  Recent Labs Lab 05/26/15 1126 05/31/15 1544  ALBUMIN 2.7* 2.8*    Gastrointestinal Profile: Last BM:  06/01/2015, bloody watery stool   Skin:   (Stage II pressure ulcer on heel)  Nutrition-Focused Physical Exam Findings:  Unable to complete Nutrition-Focused physical exam at this time.    Weight Change: Pt daughter reports weight loss of atleast 6lbs in the past couple of weeks. Per admission to ED last week, pt with 6% weight loss in one week.    Height:   Ht Readings from Last 1 Encounters:  05/31/15 5\' 4"  (1.626 m)    Weight:   Wt Readings from Last 1 Encounters:  05/31/15 130 lb 9.6 oz (59.24 kg)   Wt Readings from Last 10 Encounters:  05/31/15 130 lb 9.6 oz (59.24 kg)  05/26/15 138 lb (62.596 kg)    BMI:  Body mass index is 22.41 kg/(m^2).  Estimated Nutritional Needs:   Kcal:  1408-1665kcals, BEE: 1067kcals, TEE: (IF 1.1-1.3)(AF 1.2)  Protein:  60-71g protein (1.0-1.2g/kg)  Fluid:  1480-1724mL of fluid (25-25mL/kg)  EDUCATION NEEDS:   No education needs identified at this time   McBride, RD, LDN Pager [phone]

## 2015-06-01 NOTE — Care Management Note (Signed)
Case Management Note  Patient Details  Name: Karalyne Nusser MRN: [id number] Date of Birth: Dec 28, 1940  Subjective/Objective:                 Patient presents from with UTI.  Patient lives at home with daughter. 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 Patient has a cane that she uses as needed for ambulation.  Patient also has a hospital bed at home.  Patient has a personal care aid 5 days a week for 7 hours a day.  Daughter has concern of patients ability to tolerate PO antibiotics.   Action/Plan: RNCM to follow for discharge planning  Expected Discharge Date:                  Expected Discharge Plan:  Home/Self Care  In-House Referral:     Discharge planning Services  CM Consult  Post Acute Care Choice:    Choice offered to:     DME Arranged:    DME Agency:     HH Arranged:    HH Agency:     Status of Service:  In process, will continue to follow  Medicare Important Message Given:    Date Medicare IM Given:    Medicare IM give by:    Date Additional Medicare IM Given:    Additional Medicare Important Message give by:     If discussed at [place] of Stay Meetings, dates discussed:    Additional Comments:  Beverly Sessions, RN 06/01/2015, 10:27 AM

## 2015-06-01 NOTE — Progress Notes (Addendum)
Mrs Charlot complaining of pain, had nothing for pain ordered. Dr Lavetta Nielsen, MD on-call notified, orders placed for PRN pain medication.

## 2015-06-01 NOTE — Progress Notes (Signed)
[facility] [place] NAME: Bethany Russell    MR#:  [id number]  DATE OF BIRTH:  1941/08/26  SUBJECTIVE:  CHIEF COMPLAINT:   Chief Complaint  Patient presents with  . Fever   - Patient admitted for resistant E.coli UTI - Feels very weak, sick to her stomach and nauseated this AM - Cultures pending  REVIEW OF SYSTEMS:  Review of Systems  Constitutional: Negative for fever and chills.  HENT: Negative for ear discharge and tinnitus.   Eyes: Negative for blurred vision.  Respiratory: Negative for cough, shortness of breath and wheezing.   Cardiovascular: Negative for chest pain and palpitations.  Gastrointestinal: Positive for nausea and abdominal pain. Negative for vomiting, diarrhea and constipation.  Genitourinary: Negative for dysuria.  Neurological: Positive for weakness. Negative for dizziness, sensory change, speech change, focal weakness, seizures and headaches.    DRUG ALLERGIES:  No Known Allergies  VITALS:  Blood pressure 123/53, pulse 81, temperature 98.3 F (36.8 C), temperature source Oral, resp. rate 18, height 5\' 4"  (1.626 m), weight 59.24 kg (130 lb 9.6 oz), SpO2 96 %.  PHYSICAL EXAMINATION:  Physical Exam  GENERAL:  74 y.o.-year-old patient lying in the bed with no acute distress.  EYES: Pupils equal, round, reactive to light and accommodation. No scleral icterus. Extraocular muscles intact.  HEENT: Head atraumatic, normocephalic. Oropharynx and nasopharynx clear.  NECK:  Supple, no jugular venous distention. No thyroid enlargement, no tenderness.  LUNGS: Normal breath sounds bilaterally, no wheezing, rales,rhonchi or crepitation. No use of accessory muscles of respiration.  CARDIOVASCULAR: S1, S2 normal. No murmurs, rubs, or gallops.  ABDOMEN: Soft, nontender, nondistended. Bowel sounds present. No organomegaly or mass.  EXTREMITIES: No pedal edema, cyanosis, or clubbing.  NEUROLOGIC: Cranial nerves II  through XII are intact. Muscle strength 5/5 in all extremities. Sensation intact. Gait not checked.  PSYCHIATRIC: The patient is alert and oriented x 3.  SKIN: No obvious rash, lesion, or ulcer.    LABORATORY PANEL:   CBC  Recent Labs Lab 06/01/15 0657  WBC 6.8  HGB 8.0*  HCT 25.5*  PLT 417   ------------------------------------------------------------------------------------------------------------------  Chemistries   Recent Labs Lab 05/31/15 1544  06/01/15 0657  NA 134*  --  132*  K 4.2  --  3.2*  CL 106  --  107  CO2 19*  --  19*  GLUCOSE 186*  --  75  BUN 13  --  10  CREATININE 1.41*  < > 1.10*  CALCIUM 8.1*  --  6.9*  AST 25  --   --   ALT 11*  --   --   ALKPHOS 50  --   --   BILITOT 0.5  --   --   < > = values in this interval not displayed. ------------------------------------------------------------------------------------------------------------------  Cardiac Enzymes  Recent Labs Lab 05/31/15 1544  TROPONINI <0.03   ------------------------------------------------------------------------------------------------------------------  RADIOLOGY:  Ct Abdomen Pelvis W Contrast  05/31/2015   ADDENDUM REPORT: 05/31/2015 17:40  ADDENDUM: Correction, Impression #1 should read  "1.  SUGGESTION of mild colitis."  Study discussed by telephone with Dr. Larae Grooms on 05/31/2015 at 1721 hrs.   Electronically Signed   By: Genevie Ann M.D.   On: 05/31/2015 17:40   05/31/2015   CLINICAL DATA:  74 year old female with diffuse abdominal pain and fever for 2 weeks. Initial encounter.  EXAM: CT ABDOMEN AND PELVIS WITH CONTRAST  TECHNIQUE: Multidetector CT imaging of the abdomen and pelvis was performed  using the standard protocol following bolus administration of intravenous contrast.  CONTRAST:  38mL OMNIPAQUE IOHEXOL 300 MG/ML  SOLN  COMPARISON:  CT Abdomen and Pelvis 10/07/2013.  FINDINGS: Motion artifact at the lung bases. Cardiomegaly appears mildly progressed since last  year. No pericardial effusion. Sequelae of median sternotomy. Calcified Coronary artery atherosclerosis. New mostly dependent patchy in ground-glass opacity at both lung bases. No pleural effusion.  Scoliosis. Widespread degenerative changes in the spine. Enthesophytosis at the pelvis. No acute osseous abnormality identified.  No pelvic free fluid. Negative uterus and adnexa. Diminutive urinary bladder. Decompressed rectum.  Suggestion of mild rectal and distal sigmoid colon wall thickening, although these segments are decompressed. Questionable associated mesenteric stranding. The proximal sigmoid is tortuous, gas distended, and appears within normal limits. Short-segment left colon. Decompressed sigmoid colon where of the bowel wall again appears somewhat featureless and mildly thickened. The more proximal transverse colon has a normal appearance. Diminutive right colon. Oral contrast has reached the mid transverse. Normal retrocecal appendix. Negative terminal ileum. No pericecal inflammation. No dilated or inflamed small bowel loops. However, the duodenum is not normally rotated, does not have a retroperitoneal course and does not extend underneath the SMA. Nearly all of the small bowel is located in the right abdomen. Negative stomach.  Mild thickening of the gallbladder wall appears similar to that 2015. No pericholecystic inflammation. Similar mild intrahepatic biliary ductal prominence. Likewise, the CBD is prominent measuring up to 10 mm in diameter but has not significantly changed (9 mm in 2015). The pancreas is atrophied. No main pancreatic ductal dilatation. No abnormal enhancement of the liver. Adrenal glands appear stable and negative. The portal venous system is patent major arterial structures in the abdomen and pelvis.  Aortoiliac calcified atherosclerosis noted. Major arterial structures in the abdomen and pelvis, including the main splenic artery, appear patent.  There has been interval  infarction of the mid pole of the spleen (series 2, image 26 today versus series 2, image 25 previously) with associated new focal splenic volume loss. Enhancement of the spleen elsewhere is within normal limits. No abdominal free fluid.  Both kidneys appear stable, with no interval renal infarct identified. Renal enhancement and contrast excretion appears symmetric. Both ureters do appear larger today than previously, but no bladder mass or obstructing lesion is identified. No lymphadenopathy.  Along the ventral abdominal wall there are chronic postinflammatory areas in the subcutaneous fat (e.g. Series 2, image 60) which appear stable. This might be related to chronic subcutaneous injection sites.  IMPRESSION: 1. No suggestion of mild colitis involving the splenic flexure and also the distal sigmoid colon and rectum. No associated free fluid or complicating features. A more pronounced colitis was noted in 2015 involving much of the same colonic segments. 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 There is chronic widespread aortic calcified atherosclerosis, but no major arterial branch occlusion is identified. 3. Congenital midgut malrotation. No small bowel obstruction or inflammation. 4. Stable appearance of the gallbladder and biliary tree with mild chronic intra and extrahepatic ductal dilatation. Chronic pancreatic atrophy. 5. Dependent opacity at both lung bases, favor atelectasis over aspiration or pneumonia. 6. Cardiomegaly, mildly progressed.  Electronically Signed: By: Genevie Ann M.D. On: 05/31/2015 17:15    EKG:   Orders placed or performed during the hospital encounter of 05/31/15  . ED EKG 12-Lead  . ED EKG 12-Lead  . EKG 12-Lead  . EKG 12-Lead    ASSESSMENT AND PLAN:   74 year old female with known history  of ulcerative colitis, hypertension diabetes presents to the hospital secondary to resistant Escherichia coli infection.  #1 urinary tract infection-failed outpatient Macrobid.  Escherichia coli was resistant to Cipro that she was on prior to that. -She was symptomatic with weakness and dizziness and nausea and unable to take oral medications. -Blood and urine cultures are pending. -Continue Rocephin at this time.  #2 of ulcerative colitis-follows with Dr. Vira Agar. -Continue Flagyl, on prednisone chronically for immunosuppression, continue mesalamine. -Also on Adalimumab every 2 weeks.   #3 diabetes mellitus-continue Lantus and sliding scale insulin.  #4 GERD-on Protonix  #5 CAD status post bypass surgery-continue home medications. Currently stable. No active cardiac symptoms.  #6 DVT prophylaxis-on subcutaneous heparin  All the records are reviewed and case discussed with Care Management/Social Workerr. Management plans discussed with the patient, family and they are in agreement.  CODE STATUS:Full Code  TOTAL TIME TAKING CARE OF THIS PATIENT: 36 minutes.   POSSIBLE D/C IN 2 DAYS, DEPENDING ON CLINICAL CONDITION.   Gladstone Lighter M.D on 06/01/2015 at 2:40 PM  Between 7am to 6pm - Pager - [phone]  After 6pm go to [email or web address] - password [email or web address] [facility]  Office  [phone]  CC: Primary care physician; Ezequiel Kayser, MD

## 2015-06-01 NOTE — Progress Notes (Signed)
Mrs Denbleyker complaining of nausea, Dr Marcille Blanco, MD on-call notified, ordered Zofran PRN.

## 2015-06-02 LAB — BASIC METABOLIC PANEL
ANION GAP: 6 (ref 5–15)
BUN: 8 mg/dL (ref 6–20)
CO2: 22 mmol/L (ref 22–32)
Calcium: 7.7 mg/dL — ABNORMAL LOW (ref 8.9–10.3)
Chloride: 111 mmol/L (ref 101–111)
Creatinine, Ser: 1.12 mg/dL — ABNORMAL HIGH (ref 0.44–1.00)
GFR, EST AFRICAN AMERICAN: 54 mL/min — AB (ref >60)
GFR, EST NON AFRICAN AMERICAN: 46 mL/min — AB (ref >60)
Glucose, Bld: 109 mg/dL — ABNORMAL HIGH (ref 65–99)
POTASSIUM: 3.4 mmol/L — AB (ref 3.5–5.1)
SODIUM: 139 mmol/L (ref 135–145)

## 2015-06-02 LAB — CBC
HCT: 29.7 % — ABNORMAL LOW (ref 35.0–47.0)
Hemoglobin: 9.3 g/dL — ABNORMAL LOW (ref 12.0–16.0)
MCH: 22.9 pg — ABNORMAL LOW (ref 26.0–34.0)
MCHC: 31.3 g/dL — ABNORMAL LOW (ref 32.0–36.0)
MCV: 73.2 fL — ABNORMAL LOW (ref 80.0–100.0)
PLATELETS: 425 10*3/uL (ref 150–440)
RBC: 4.06 MIL/uL (ref 3.80–5.20)
RDW: 17.8 % — ABNORMAL HIGH (ref 11.5–14.5)
WBC: 8 10*3/uL (ref 3.6–11.0)

## 2015-06-02 LAB — GLUCOSE, CAPILLARY
GLUCOSE-CAPILLARY: 231 mg/dL — AB (ref 65–99)
GLUCOSE-CAPILLARY: 165 mg/dL — AB (ref 65–99)
GLUCOSE-CAPILLARY: 204 mg/dL — AB (ref 65–99)

## 2015-06-02 LAB — C DIFFICILE QUICK SCREEN W PCR REFLEX
C Diff antigen: NEGATIVE
C Diff interpretation: NEGATIVE
C Diff toxin: NEGATIVE

## 2015-06-02 LAB — URINE CULTURE: Culture: 6000

## 2015-06-02 MED ORDER — CLOPIDOGREL BISULFATE 75 MG PO TABS: 75.0000 mg | ORAL_TABLET | Freq: Every day | ORAL | Status: DC

## 2015-06-02 MED ORDER — METRONIDAZOLE 500 MG PO TABS: 500.0000 mg | ORAL_TABLET | Freq: Two times a day (BID) | ORAL | Status: DC

## 2015-06-02 MED ORDER — METOPROLOL SUCCINATE ER 25 MG PO TB24
25.0000 mg | ORAL_TABLET | Freq: Every day | ORAL | Status: DC
  Administered 2015-06-02 – 2015-06-10 (×8): 25 mg via ORAL
  Filled 2015-06-02 (×9): qty 1

## 2015-06-02 MED ORDER — PANTOPRAZOLE SODIUM 40 MG IV SOLR
40.0000 mg | Freq: Two times a day (BID) | INTRAVENOUS | Status: DC
  Administered 2015-06-02: 40 mg via INTRAVENOUS
  Filled 2015-06-02: qty 40

## 2015-06-02 MED ORDER — ALPRAZOLAM 0.5 MG PO TABS
0.5000 mg | ORAL_TABLET | Freq: Three times a day (TID) | ORAL | Status: DC | PRN
  Administered 2015-06-02 – 2015-06-09 (×7): 0.5 mg via ORAL
  Filled 2015-06-02 (×7): qty 1

## 2015-06-02 MED ORDER — INSULIN GLARGINE 100 UNIT/ML ~~LOC~~ SOLN
20.0000 [IU] | Freq: Every day | SUBCUTANEOUS | Status: DC
  Administered 2015-06-03 – 2015-06-04 (×2): 20 [IU] via SUBCUTANEOUS
  Filled 2015-06-02 (×5): qty 0.2

## 2015-06-02 NOTE — Care Management Important Message (Signed)
Important Message  Patient Details  Name: Bethany Russell MRN: [id number] Date of Birth: [birth year]   Medicare Important Message Given:  Yes-second notification given    Darius Bump Allmond 06/02/2015, 2:09 PM

## 2015-06-02 NOTE — Progress Notes (Signed)
[facility] [place] NAME: Bethany Russell    MR#:  [id number]  DATE OF BIRTH:  [birth year]  SUBJECTIVE:  CHIEF COMPLAINT:   Chief Complaint  Patient presents with  . Fever   - Felt much better last night. -Started to have increased abdominal pain especially in the lower abdomen, had bloody diarrhea this morning. Feels like her colitis is flaring up. -Cultures are negative so far.  REVIEW OF SYSTEMS:  Review of Systems  Constitutional: Negative for fever and chills.  HENT: Negative for ear discharge and tinnitus.   Eyes: Negative for blurred vision.  Respiratory: Negative for cough, shortness of breath and wheezing.   Cardiovascular: Negative for chest pain and palpitations.  Gastrointestinal: Positive for nausea and abdominal pain. Negative for vomiting, diarrhea and constipation.  Genitourinary: Negative for dysuria.  Neurological: Positive for weakness. Negative for dizziness, sensory change, speech change, focal weakness, seizures and headaches.    DRUG ALLERGIES:  No Known Allergies  VITALS:  Blood pressure 179/77, pulse 109, temperature 98 F (36.7 C), temperature source Oral, resp. rate 16, height 5\' 4"  (1.626 m), weight 59.24 kg (130 lb 9.6 oz), SpO2 98 %.  PHYSICAL EXAMINATION:  Physical Exam  GENERAL:  74 y.o.-year-old patient lying in the bed with no acute distress.  EYES: Pupils equal, round, reactive to light and accommodation. No scleral icterus. Extraocular muscles intact.  HEENT: Head atraumatic, normocephalic. Oropharynx and nasopharynx clear.  NECK:  Supple, no jugular venous distention. No thyroid enlargement, no tenderness.  LUNGS: Normal breath sounds bilaterally, no wheezing, rales,rhonchi or crepitation. No use of accessory muscles of respiration.  CARDIOVASCULAR: S1, S2 normal. No murmurs, rubs, or gallops.  ABDOMEN: Soft, nontender, nondistended. Bowel sounds present. No organomegaly or mass.   EXTREMITIES: No pedal edema, cyanosis, or clubbing.  NEUROLOGIC: Cranial nerves II through XII are intact. Muscle strength 5/5 in all extremities. Sensation intact. Gait not checked.  PSYCHIATRIC: The patient is alert and oriented x 3.  SKIN: No obvious rash, lesion, or ulcer.    LABORATORY PANEL:   CBC  Recent Labs Lab 06/02/15 0618  WBC 8.0  HGB 9.3*  HCT 29.7*  PLT 425   ------------------------------------------------------------------------------------------------------------------  Chemistries   Recent Labs Lab 05/31/15 1544  06/02/15 0618  NA 134*  < > 139  K 4.2  < > 3.4*  CL 106  < > 111  CO2 19*  < > 22  GLUCOSE 186*  < > 109*  BUN 13  < > 8  CREATININE 1.41*  < > 1.12*  CALCIUM 8.1*  < > 7.7*  AST 25  --   --   ALT 11*  --   --   ALKPHOS 50  --   --   BILITOT 0.5  --   --   < > = values in this interval not displayed. ------------------------------------------------------------------------------------------------------------------  Cardiac Enzymes  Recent Labs Lab 05/31/15 1544  TROPONINI <0.03   ------------------------------------------------------------------------------------------------------------------  RADIOLOGY:  Ct Abdomen Pelvis W Contrast  05/31/2015   ADDENDUM REPORT: 05/31/2015 17:40  ADDENDUM: Correction, Impression #1 should read  "1.  SUGGESTION of mild colitis."  Study discussed by telephone with Dr. Larae Grooms on 05/31/2015 at 1721 hrs.   Electronically Signed   By: Genevie Ann M.D.   On: 05/31/2015 17:40   05/31/2015   CLINICAL DATA:  74 year old female with diffuse abdominal pain and fever for 2 weeks. Initial encounter.  EXAM: CT ABDOMEN AND PELVIS WITH CONTRAST  TECHNIQUE: Multidetector CT imaging of the abdomen and pelvis was performed using the standard protocol following bolus administration of intravenous contrast.  CONTRAST:  63mL OMNIPAQUE IOHEXOL 300 MG/ML  SOLN  COMPARISON:  CT Abdomen and Pelvis 10/07/2013.  FINDINGS:  Motion artifact at the lung bases. Cardiomegaly appears mildly progressed since last year. No pericardial effusion. Sequelae of median sternotomy. Calcified Coronary artery atherosclerosis. New mostly dependent patchy in ground-glass opacity at both lung bases. No pleural effusion.  Scoliosis. Widespread degenerative changes in the spine. Enthesophytosis at the pelvis. No acute osseous abnormality identified.  No pelvic free fluid. Negative uterus and adnexa. Diminutive urinary bladder. Decompressed rectum.  Suggestion of mild rectal and distal sigmoid colon wall thickening, although these segments are decompressed. Questionable associated mesenteric stranding. The proximal sigmoid is tortuous, gas distended, and appears within normal limits. Short-segment left colon. Decompressed sigmoid colon where of the bowel wall again appears somewhat featureless and mildly thickened. The more proximal transverse colon has a normal appearance. Diminutive right colon. Oral contrast has reached the mid transverse. Normal retrocecal appendix. Negative terminal ileum. No pericecal inflammation. No dilated or inflamed small bowel loops. However, the duodenum is not normally rotated, does not have a retroperitoneal course and does not extend underneath the SMA. Nearly all of the small bowel is located in the right abdomen. Negative stomach.  Mild thickening of the gallbladder wall appears similar to that 2015. No pericholecystic inflammation. Similar mild intrahepatic biliary ductal prominence. Likewise, the CBD is prominent measuring up to 10 mm in diameter but has not significantly changed (9 mm in 2015). The pancreas is atrophied. No main pancreatic ductal dilatation. No abnormal enhancement of the liver. Adrenal glands appear stable and negative. The portal venous system is patent major arterial structures in the abdomen and pelvis.  Aortoiliac calcified atherosclerosis noted. Major arterial structures in the abdomen and pelvis,  including the main splenic artery, appear patent.  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 Enhancement of the spleen elsewhere is within normal limits. No abdominal free fluid.  Both kidneys appear stable, with no interval renal infarct identified. Renal enhancement and contrast excretion appears symmetric. Both ureters do appear larger today than previously, but no bladder mass or obstructing lesion is identified. No lymphadenopathy.  Along the ventral abdominal wall there are chronic postinflammatory areas in the subcutaneous fat (e.g. Series 2, image 60) which appear stable. This might be related to chronic subcutaneous injection sites.  IMPRESSION: 1. No suggestion of mild colitis involving the splenic flexure and also the distal sigmoid colon and rectum. No associated free fluid or complicating features. A more pronounced colitis was noted in 2015 involving much of the same colonic segments. 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 There is chronic widespread aortic calcified atherosclerosis, but no major arterial branch occlusion is identified. 3. Congenital midgut malrotation. No small bowel obstruction or inflammation. 4. Stable appearance of the gallbladder and biliary tree with mild chronic intra and extrahepatic ductal dilatation. Chronic pancreatic atrophy. 5. Dependent opacity at both lung bases, favor atelectasis over aspiration or pneumonia. 6. Cardiomegaly, mildly progressed.  Electronically Signed: By: Genevie Ann M.D. On: 05/31/2015 17:15    EKG:   Orders placed or performed during the hospital encounter of 05/31/15  . ED EKG 12-Lead  . ED EKG 12-Lead  . EKG 12-Lead  . EKG 12-Lead  ASSESSMENT AND PLAN:   74 year old female with known history of ulcerative colitis, hypertension diabetes presents to the hospital secondary to resistant Escherichia  coli infection.  #1 urinary tract infection-failed outpatient Macrobid. Escherichia coli was resistant to Cipro that she was on prior to that. -Blood and urine cultures are negative so far. -Continue Rocephin, treat for a total of 5 days  #2 Worsening of ulcerative colitis- worsening abdominal pain, bloody diarrhea -follows with Dr. Vira Agar. -Continue Flagyl, on prednisone chronically for immunosuppression, - continue mesalamine. -Also on Humira every 2 weeks.  - Likely flare- GI consulted - may be IV steroids need to be added  #3 Diabetes mellitus-continue Lantus and sliding scale insulin.  #4 GERD-on Protonix  #5 CAD status post bypass surgery-continue home medications. Currently stable. No active cardiac symptoms.  #6 DVT prophylaxis-on subcutaneous heparin  All the records are reviewed and case discussed with Care Management/Social Workerr. Management plans discussed with the patient, family and they are in agreement.  CODE STATUS:Full Code  TOTAL TIME TAKING CARE OF THIS PATIENT: 36 minutes.   POSSIBLE D/C TOMORROW, DEPENDING ON CLINICAL CONDITION.   Araina Butrick M.D on 06/02/2015 at 1:30 PM  Between 7am to 6pm - Pager - [phone]  After 6pm go to [email or web address] - password [email or web address] [facility]  Office  [phone]  CC: Primary care physician; Ezequiel Kayser, MD

## 2015-06-02 NOTE — Consult Note (Signed)
GI Inpatient Consult Note Lollie Sails MD  Reason for Consult: Possible flare of ulcerative colitis   Attending Requesting Consult: Dr. Tressia Miners  Outpatient Primary Physician: Dr. Genene Churn, Dr. Gaylyn Cheers  History of Present Illness: Bethany Russell is a 74 y.o. female who was admitted on 05/31/2015 residing at that time with a fever. She is 74 years old and has some amount of dementia much of the history is obtained instead from her caretaker daughter.  She has a longer history of C. difficile colitis his been on combination therapy with Asacol and Humira since January 2015. This seemed be doing quite well for her although at baseline she continues to have no solid stools, takes Imodium daily, and may have up to 10 bowel movements a day. Over she has seen no blood in the stools at home. Out 2 weeks ago she began to feel bad ultralow fever was sent to the emergency room for further evaluation. She was found to have a urinary tract infection and was started on Flagyl and Cipro. He then saw Dr. Vira Agar this past Friday occasions were continued. A sensitivity on her urine came back to the weekend indicating that the Cipro would not be a good agent and she was changed to [place]. She was also continued on Flagyl. He did develop a rash after beginning the [place]. Came to the hospital Tuesday night and was admitted for further evaluation and change of anti-biotics therapy.  Currently she is feeling better however she continues to have a poor appetite as well as loose stool. She did see some red bloody type of effluent in the stool early this morning only darker and she has been hemodynamically stable. She did have a slight drop in her hemoglobin after admission however may have been dehydrated that time.  She has had some nausea during this hospitalization which is controlled with Zofran. He has had some small amounts of emesis when this occurs. There is been no hematemesis.  She is  due for her next dose of Humira this coming Tuesday.  Patient's last complete colonoscopy was 10/23/2011 showing a pancolitis though mostly inflamed in the left colon. He had a flexible sigmoidoscopy on 02/27/2013 showing left-sided disease as well area he has a personal history of adenomatous colon polyps. She is due for a complete colonoscopy.  It is of note that she takes Plavix daily as well as Protonix.  Past Medical History:  Past Medical History  Diagnosis Date  . Ulcerative colitis   . Hypertension   . Diabetes mellitus without complication   . Cancer     skin cancer  . Coronary artery disease     Problem List: Patient Active Problem List   Diagnosis Date Noted  . Pressure ulcer 06/01/2015  . Protein-calorie malnutrition, severe 06/01/2015  . Sepsis 05/31/2015  . UTI (lower urinary tract infection) 05/31/2015    Past Surgical History: Past Surgical History  Procedure Laterality Date  . Coronary artery bypass graft    . Skin biopsy      Allergies: No Known Allergies  Home Medications: Prescriptions prior to admission  Medication Sig Dispense Refill Last Dose  . acetaminophen-codeine (TYLENOL #3) 300-30 MG per tablet Take 1 tablet by mouth 3 (three) times daily.    05/31/2015 at 1200  . alendronate (FOSAMAX) 70 MG tablet Take 70 mg by mouth once a week. Pt takes on Sunday.   05/29/2015 at unknown  . aspirin 81 MG chewable tablet Chew 1 tablet by  mouth daily.   05/31/2015 at 0700  . clopidogrel (PLAVIX) 75 MG tablet Take 75 mg by mouth daily.    05/31/2015 at 0700  . diclofenac sodium (VOLTAREN) 1 % GEL Apply 2 g topically 4 (four) times daily as needed (for pain).    PRN at PRN  . ferrous sulfate 325 (65 FE) MG tablet Take 325 mg by mouth at bedtime.    05/30/2015 at Unknown time  . HUMIRA PEN 40 MG/0.8ML PNKT Inject 40 mg into the skin every 14 (fourteen) days. Pt uses on Tuesday.   05/24/2015 at unknown  . insulin glargine (LANTUS) 100 UNIT/ML injection Inject 26 Units  into the skin daily.   05/31/2015 at Unknown time  . insulin lispro (HUMALOG) 100 UNIT/ML injection Inject 8-16 Units into the skin 3 (three) times daily as needed for high blood sugar. Pt uses per sliding scale at every meal.   05/31/2015 at Unknown time  . levothyroxine (SYNTHROID, LEVOTHROID) 25 MCG tablet Take 25 mcg by mouth daily.    05/31/2015 at Unknown time  . loperamide (IMODIUM) 2 MG capsule Take 2 mg by mouth 2 (two) times daily.    05/31/2015 at Unknown time  . Melatonin 3 MG TABS Take 3 mg by mouth at bedtime.    05/30/2015 at Unknown time  . Mesalamine (ASACOL HD) 800 MG TBEC Take 800 mg by mouth 3 (three) times daily.    05/31/2015 at Unknown time  . metoprolol succinate (TOPROL-XL) 25 MG 24 hr tablet Take 25 mg by mouth daily.   05/31/2015 at 0700  . metroNIDAZOLE (FLAGYL) 500 MG tablet Take 1 tablet (500 mg total) by mouth 2 (two) times daily after a meal. 14 tablet 0 05/31/2015 at Unknown time  . nitrofurantoin, macrocrystal-monohydrate, (MACROBID) 100 MG capsule Take 100 mg by mouth 2 (two) times daily.   05/31/2015 at Unknown time  . pantoprazole (PROTONIX) 40 MG tablet Take 40 mg by mouth daily.    05/31/2015 at Unknown time  . pravastatin (PRAVACHOL) 80 MG tablet Take 80 mg by mouth at bedtime.    05/30/2015 at Unknown time  . predniSONE (DELTASONE) 10 MG tablet Take 10 mg by mouth every other day.   05/30/2015 at Unknown time  . sertraline (ZOLOFT) 50 MG tablet Take 50 mg by mouth daily.    05/31/2015 at Unknown time  . Vitamin D, Ergocalciferol, (DRISDOL) 50000 UNITS CAPS capsule Take 50,000 Units by mouth once a week. Pt takes on Sunday.   05/29/2015 at unknown  . ciprofloxacin (CIPRO) 500 MG tablet Take 1 tablet (500 mg total) by mouth 2 (two) times daily. (Patient not taking: Reported on 05/31/2015) 14 tablet 0    Home medication reconciliation was completed with the patient.   Scheduled Inpatient Medications:   . [START ON 06/07/2015] Adalimumab  40 mg Subcutaneous Q14 Days  .  aspirin  81 mg Oral Daily  . cefTRIAXone (ROCEPHIN)  IV  1 g Intravenous Q24H  . clopidogrel  75 mg Oral Daily  . feeding supplement (GLUCERNA SHAKE)  237 mL Oral TID WC  . ferrous sulfate  325 mg Oral QHS  . heparin  5,000 Units Subcutaneous 3 times per day  . insulin aspart  0-9 Units Subcutaneous TID WC  . [START ON 06/03/2015] insulin glargine  20 Units Subcutaneous Daily  . levothyroxine  25 mcg Oral QAC breakfast  . loperamide  2 mg Oral BID  . Mesalamine  800 mg Oral TID  . metoprolol succinate  25 mg Oral Daily  . metroNIDAZOLE  500 mg Oral BID PC  . pantoprazole  40 mg Oral Daily  . pravastatin  80 mg Oral QHS  . predniSONE  10 mg Oral QODAY  . sertraline  50 mg Oral Daily  . [START ON 06/05/2015] Vitamin D (Ergocalciferol)  50,000 Units Oral Weekly    Continuous Inpatient Infusions:     PRN Inpatient Medications:  acetaminophen, ALPRAZolam, morphine injection, ondansetron (ZOFRAN) IV, oxyCODONE, traZODone  Family History: family history includes CAD in her father and mother; Colon cancer in her brother.   GI Family History:   Social History:   reports that she has never smoked. She does not have any smokeless tobacco history on file. She reports that she does not drink alcohol. The patient denies ETOH, tobacco, or drug use.   ROS  Review of Systems: 10 systems reviewed per admission history and physical agree with same  Physical Examination: BP 164/82 mmHg  Pulse 94  Temp(Src) 98 F (36.7 C) (Oral)  Resp 16  Ht 5\' 4"  (1.626 m)  Wt 59.24 kg (130 lb 9.6 oz)  BMI 22.41 kg/m2  SpO2 98% Gen: Elderly-appearing 74 year old female no acute distress. HEENT: Normocephalic atraumatic eyes are anicteric Neck: Supple no JVD no lymphadenopathy no thyromegaly Chest: Clear to auscultation CV: Regular rate and rhythm without rub or gallop Abd: Soft nontender nondistended bowel sounds are positive normoactive Ext: No clubbing cyanosis or edema Skin: Other:  Data: Lab  Results  Component Value Date   WBC 8.0 06/02/2015   HGB 9.3* 06/02/2015   HCT 29.7* 06/02/2015   MCV 73.2* 06/02/2015   PLT 425 06/02/2015    Recent Labs Lab 05/31/15 2020 06/01/15 0657 06/02/15 0618  HGB 8.8* 8.0* 9.3*   Lab Results  Component Value Date   NA 139 06/02/2015   K 3.4* 06/02/2015   CL 111 06/02/2015   CO2 22 06/02/2015   BUN 8 06/02/2015   CREATININE 1.12* 06/02/2015   Lab Results  Component Value Date   ALT 11* 05/31/2015   AST 25 05/31/2015   ALKPHOS 50 05/31/2015   BILITOT 0.5 05/31/2015   No results for input(s): APTT, INR, PTT in the last 168 hours. CBC Latest Ref Rng 06/02/2015 06/01/2015 05/31/2015  WBC 3.6 - 11.0 K/uL 8.0 6.8 8.7  Hemoglobin 12.0 - 16.0 g/dL 9.3(L) 8.0(L) 8.8(L)  Hematocrit 35.0 - 47.0 % 29.7(L) 25.5(L) 28.3(L)  Platelets 150 - 440 K/uL 425 417 454(H)    STUDIES: Ct Abdomen Pelvis W Contrast  05/31/2015   ADDENDUM REPORT: 05/31/2015 17:40  ADDENDUM: Correction, Impression #1 should read  "1.  SUGGESTION of mild colitis."  Study discussed by telephone with Dr. Larae Grooms on 05/31/2015 at 1721 hrs.   Electronically Signed   By: Genevie Ann M.D.   On: 05/31/2015 17:40   05/31/2015   CLINICAL DATA:  74 year old female with diffuse abdominal pain and fever for 2 weeks. Initial encounter.  EXAM: CT ABDOMEN AND PELVIS WITH CONTRAST  TECHNIQUE: Multidetector CT imaging of the abdomen and pelvis was performed using the standard protocol following bolus administration of intravenous contrast.  CONTRAST:  37mL OMNIPAQUE IOHEXOL 300 MG/ML  SOLN  COMPARISON:  CT Abdomen and Pelvis 10/07/2013.  FINDINGS: Motion artifact at the lung bases. Cardiomegaly appears mildly progressed since last year. No pericardial effusion. Sequelae of median sternotomy. Calcified Coronary artery atherosclerosis. New mostly dependent patchy in ground-glass opacity at both lung bases. No pleural effusion.  Scoliosis. Widespread degenerative changes in  the spine.  Enthesophytosis at the pelvis. No acute osseous abnormality identified.  No pelvic free fluid. Negative uterus and adnexa. Diminutive urinary bladder. Decompressed rectum.  Suggestion of mild rectal and distal sigmoid colon wall thickening, although these segments are decompressed. Questionable associated mesenteric stranding. The proximal sigmoid is tortuous, gas distended, and appears within normal limits. Short-segment left colon. Decompressed sigmoid colon where of the bowel wall again appears somewhat featureless and mildly thickened. The more proximal transverse colon has a normal appearance. Diminutive right colon. Oral contrast has reached the mid transverse. Normal retrocecal appendix. Negative terminal ileum. No pericecal inflammation. No dilated or inflamed small bowel loops. However, the duodenum is not normally rotated, does not have a retroperitoneal course and does not extend underneath the SMA. Nearly all of the small bowel is located in the right abdomen. Negative stomach.  Mild thickening of the gallbladder wall appears similar to that 2015. No pericholecystic inflammation. Similar mild intrahepatic biliary ductal prominence. Likewise, the CBD is prominent measuring up to 10 mm in diameter but has not significantly changed (9 mm in 2015). The pancreas is atrophied. No main pancreatic ductal dilatation. No abnormal enhancement of the liver. Adrenal glands appear stable and negative. The portal venous system is patent major arterial structures in the abdomen and pelvis.  Aortoiliac calcified atherosclerosis noted. Major arterial structures in the abdomen and pelvis, including the main splenic artery, appear patent.  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 Enhancement of the spleen elsewhere is within normal limits. No abdominal free fluid.  Both kidneys appear  stable, with no interval renal infarct identified. Renal enhancement and contrast excretion appears symmetric. Both ureters do appear larger today than previously, but no bladder mass or obstructing lesion is identified. No lymphadenopathy.  Along the ventral abdominal wall there are chronic postinflammatory areas in the subcutaneous fat (e.g. Series 2, image 60) which appear stable. This might be related to chronic subcutaneous injection sites.  IMPRESSION: 1. No suggestion of mild colitis involving the splenic flexure and also the distal sigmoid colon and rectum. No associated free fluid or complicating features. A more pronounced colitis was noted in 2015 involving much of the same colonic segments. 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 There is chronic widespread aortic calcified atherosclerosis, but no major arterial branch occlusion is identified. 3. Congenital midgut malrotation. No small bowel obstruction or inflammation. 4. Stable appearance of the gallbladder and biliary tree with mild chronic intra and extrahepatic ductal dilatation. Chronic pancreatic atrophy. 5. Dependent opacity at both lung bases, favor atelectasis over aspiration or pneumonia. 6. Cardiomegaly, mildly progressed.  Electronically Signed: By: Genevie Ann M.D. On: 05/31/2015 17:15   @IMAGES @  Assessment: 1. Patient presenting back to the hospital with fever in the setting of known history of ulcerative colitis and urinary tract infection. sHe does take Humira as an outpatient. He is currently on good anti-biotic coverage or the urinary tract infection. He did develop some mild amount of blood in her stool earlier today. I concern is that could be a secondary infection and we need to rule out C. difficile as well. 2. Guards to her history of ulcerative colitis will need a luminal evaluation however initially need to rule out other infective agents in the colon. CT scan showed less amount of inflammation than that noted in 2015 on  her last CT scan.  He had also been doing well recently with her Humira dosing combined with the mesalamine.  Recommendations: 1. Cultures and C. difficile studies 2. DC Flagyl. If positive for C. difficile I would change her to oral vancomycin 250 mg every 6 hours. Likely her nausea is exacerbated by the Flagyl. 3. Recommend EGD and colonoscopy when clinically feasible 4. Will hold Plavix for now as she was passing some bloody stool.  Thank you for the consult. Please call with questions or concerns.  Lollie Sails, MD  06/02/2015 3:54 PM

## 2015-06-02 NOTE — Evaluation (Signed)
Physical Therapy Evaluation Patient Details Name: Bethany Russell MRN: [id number] DOB: 10/23/1940 Today's Date: 06/02/2015   History of Present Illness  Pt is a 74 yo female who was admitted to the hospital with abdominal pain, weakness, and fever. A few days ago she was d/c'd from the hospital for UTI. Pt attempted to manage UTI through outpatient but was unsuccessful, and her symptoms got worse so she came back to [place]  Pt presents with hx of ulcerative colitis, HTN, DM, cancer, and CAD. Examination reveals that pt is relatively independent with transfers and bed mobility, demonstrating good functional strength and endurance, as well as a strong support system in her daughter and aides that come to her house. Pt is also physically active with ambulation. Due to the lack of apparent physical deficits at this time, pt will be d/c'd in house from PT services. Family is in a agreement with this recommendation. Family also not interested in any further therapy post-discharge, but is open to it down the road. Should further PT needs arise, please issue new orders.     Follow Up Recommendations No PT follow up (Pt and daughter don't wish to have any at this time.)    Equipment Recommendations  None recommended by PT    Recommendations for Other Services       Precautions / Restrictions Precautions Precautions: Fall Restrictions Weight Bearing Restrictions: No      Mobility  Bed Mobility Overal bed mobility: Modified Independent             General bed mobility comments: Good use of hands on hand rails. Good confidence with movements  Transfers Overall transfer level: Modified independent Equipment used: Straight cane             General transfer comment: Pt able to stand with good functional strength, no unsteadiness or LOB.   Ambulation/Gait Ambulation/Gait assistance: Min guard Ambulation Distance (Feet): 240 Feet Assistive device: Straight  cane Gait Pattern/deviations: WFL(Within Functional Limits);Staggering left;Staggering right Gait velocity: decreased   General Gait Details: Pt has very minor lateral sway with ambulation that she can self-correct. Her and her daughter report no falls within the past 6 months or year. Pt states she feels very confident with her ambulation. Daughter advised that if ambulation gets worse, to use RW that they have at home to stabilize her during ambulation.   Stairs            Wheelchair Mobility    Modified Rankin (Stroke Patients Only)       Balance Overall balance assessment: No apparent balance deficits (not formally assessed)                                           Pertinent Vitals/Pain Pain Assessment: No/denies pain    Home Living Family/patient expects to be discharged to:: Private residence Living Arrangements: Children Available Help at Discharge: Available 24 hours/day;Personal care attendant;Family Type of Home: House Home Access: Level entry     Home Layout: One level Home Equipment: [place] - 2 wheels;Cane - single point;Hospital bed;Bedside commode;Shower seat (States no additional needs)      Prior Function Level of Independence: Independent with assistive device(s)         Comments: Pt was performing community ambulation with SPC. Pt was relatively independent before hospital stay and would focus on recreational activites with her family  such as walking     Hand Dominance        Extremity/Trunk Assessment   Upper Extremity Assessment: Overall WFL for tasks assessed           Lower Extremity Assessment: Overall WFL for tasks assessed         Communication   Communication: No difficulties  Cognition Arousal/Alertness: Awake/alert Behavior During Therapy: WFL for tasks assessed/performed Overall Cognitive Status: Within Functional Limits for tasks assessed                      General Comments General  comments (skin integrity, edema, etc.): Pt performed 5 time sit-to-stand in 11 seconds indicating that she is not at an abnormally high risk for falls    Exercises        Assessment/Plan    PT Assessment Patent does not need any further PT services  PT Diagnosis Other (comment) (decreased activity tolerance)   PT Problem List    PT Treatment Interventions     PT Goals (Current goals can be found in the Care Plan section)      Frequency     Barriers to discharge        Co-evaluation               End of Session Equipment Utilized During Treatment: Gait belt Activity Tolerance: Patient tolerated treatment well Patient left: in bed;with bed alarm set;with family/visitor present Nurse Communication: Mobility status         Time: 6294-7654 PT Time Calculation (min) (ACUTE ONLY): 22 min   Charges:   PT Evaluation $Initial PT Evaluation Tier I: 1 Procedure     PT G CodesJanyth Contes 06/10/15, 5:16 PM  Janyth Contes, SPT. [phone]

## 2015-06-02 NOTE — Evaluation (Signed)
Physical Therapy Evaluation Patient Details Name: Bethany Russell MRN: [id number] DOB: [birth year] Today's Date: 06/02/2015   History of Present Illness  Pt is a 74 yo female who was admitted to the hospital with abdominal pain, weakness, and fever. A few days ago she was d/c'd from the hospital for UTI. Pt attempted to manage UTI through outpatient but was unsuccessful, and her symptoms got worse so she came back to [place]  Pt presents with hx of ulcerative colitis, HTN, DM, cancer, and CAD. Examination reveals that pt is relatively independent with transfers and bed mobility, demonstrating good functional strength and endurance, as well as a strong support system in her daughter and aides that come to her house. Pt is also physically active with ambulation. Due to the lack of apparent physical deficits at this time, pt will be d/c'd in house from PT services. Family is in a agreement with this recommendation. Family also not interested in any further therapy post-discharge, but is open to it down the road. Should further PT needs arise, please issue new orders.     Follow Up Recommendations No PT follow up (Pt and daughter don't wish to have any at this time.)    Equipment Recommendations  None recommended by PT    Recommendations for Other Services       Precautions / Restrictions Precautions Precautions: Fall Restrictions Weight Bearing Restrictions: No      Mobility  Bed Mobility Overal bed mobility: Modified Independent             General bed mobility comments: Good use of hands on hand rails. Good confidence with movements  Transfers Overall transfer level: Modified independent Equipment used: Straight cane             General transfer comment: Pt able to stand with good functional strength, no unsteadiness or LOB.   Ambulation/Gait Ambulation/Gait assistance: Min guard Ambulation Distance (Feet): 240 Feet Assistive device: Straight  cane Gait Pattern/deviations: WFL(Within Functional Limits);Staggering left;Staggering right Gait velocity: decreased   General Gait Details: Pt has very minor lateral sway with ambulation that she can self-correct. Her and her daughter report no falls within the past 6 months or year. Pt states she feels very confident with her ambulation. Daughter advised that if ambulation gets worse, to use RW that they have at home to stabilize her during ambulation.   Stairs            Wheelchair Mobility    Modified Rankin (Stroke Patients Only)       Balance Overall balance assessment: No apparent balance deficits (not formally assessed)                                           Pertinent Vitals/Pain Pain Assessment: No/denies pain    Home Living Family/patient expects to be discharged to:: Private residence Living Arrangements: Children Available Help at Discharge: Available 24 hours/day;Personal care attendant;Family Type of Home: House Home Access: Level entry     Home Layout: One level Home Equipment: [place] - 2 wheels;Cane - single point;Hospital bed;Bedside commode;Shower seat (States no additional needs)      Prior Function Level of Independence: Independent with assistive device(s)         Comments: Pt was performing community ambulation with SPC. Pt was relatively independent before hospital stay and would focus on recreational activites with her family  such as walking     Hand Dominance        Extremity/Trunk Assessment   Upper Extremity Assessment: Overall WFL for tasks assessed           Lower Extremity Assessment: Overall WFL for tasks assessed         Communication   Communication: No difficulties  Cognition Arousal/Alertness: Awake/alert Behavior During Therapy: WFL for tasks assessed/performed Overall Cognitive Status: Within Functional Limits for tasks assessed                      General Comments General  comments (skin integrity, edema, etc.): Pt performed 5 time sit-to-stand in 11 seconds indicating that she is not at an abnormally high risk for falls    Exercises        Assessment/Plan    PT Assessment Patent does not need any further PT services  PT Diagnosis Other (comment) (decreased activity tolerance)   PT Problem List    PT Treatment Interventions     PT Goals (Current goals can be found in the Care Plan section)      Frequency     Barriers to discharge        Co-evaluation               End of Session Equipment Utilized During Treatment: Gait belt Activity Tolerance: Patient tolerated treatment well Patient left: in bed;with bed alarm set;with family/visitor present Nurse Communication: Mobility status         Time:  -      Charges:         PT G CodesJanyth Contes 06-26-2015, 4:49 PM Janyth Contes, SPT. [phone]

## 2015-06-02 NOTE — Plan of Care (Signed)
Dr contacted about [place] dose of 26U and BS of 99.  Dr. Hanley Seamen verbal to hold dose this AM.

## 2015-06-03 ENCOUNTER — Other Ambulatory Visit: Payer: Self-pay | Admitting: Internal Medicine

## 2015-06-03 LAB — GLUCOSE, CAPILLARY
GLUCOSE-CAPILLARY: 126 mg/dL — AB (ref 65–99)
GLUCOSE-CAPILLARY: 228 mg/dL — AB (ref 65–99)
GLUCOSE-CAPILLARY: 293 mg/dL — AB (ref 65–99)
Glucose-Capillary: 131 mg/dL — ABNORMAL HIGH (ref 65–99)
Glucose-Capillary: 188 mg/dL — ABNORMAL HIGH (ref 65–99)

## 2015-06-03 LAB — HEMOGLOBIN
HEMOGLOBIN: 7.2 g/dL — AB (ref 12.0–16.0)
Hemoglobin: 7.1 g/dL — ABNORMAL LOW (ref 12.0–16.0)
Hemoglobin: 8.3 g/dL — ABNORMAL LOW (ref 12.0–16.0)
Hemoglobin: 8.6 g/dL — ABNORMAL LOW (ref 12.0–16.0)

## 2015-06-03 LAB — PREPARE RBC (CROSSMATCH)

## 2015-06-03 LAB — HEMOGLOBIN AND HEMATOCRIT, BLOOD
HEMATOCRIT: 28.9 % — AB (ref 35.0–47.0)
Hemoglobin: 9.6 g/dL — ABNORMAL LOW (ref 12.0–16.0)

## 2015-06-03 LAB — MRSA PCR SCREENING: MRSA by PCR: NEGATIVE

## 2015-06-03 MED ORDER — ACETAMINOPHEN 325 MG PO TABS
650.0000 mg | ORAL_TABLET | Freq: Once | ORAL | Status: AC
  Administered 2015-06-03: 650 mg via ORAL

## 2015-06-03 MED ORDER — SODIUM CHLORIDE 0.9 % IV BOLUS (SEPSIS)
1000.0000 mL | Freq: Once | INTRAVENOUS | Status: AC
  Administered 2015-06-03: 1000 mL via INTRAVENOUS

## 2015-06-03 MED ORDER — SODIUM CHLORIDE 0.9 % IV SOLN
80.0000 mg | Freq: Once | INTRAVENOUS | Status: AC
  Administered 2015-06-03: 80 mg via INTRAVENOUS
  Filled 2015-06-03: qty 80

## 2015-06-03 MED ORDER — DIPHENHYDRAMINE HCL 25 MG PO CAPS
25.0000 mg | ORAL_CAPSULE | Freq: Once | ORAL | Status: AC
  Administered 2015-06-03: 25 mg via ORAL
  Filled 2015-06-03: qty 1

## 2015-06-03 MED ORDER — INSULIN ASPART 100 UNIT/ML ~~LOC~~ SOLN
4.0000 [IU] | Freq: Once | SUBCUTANEOUS | Status: AC
  Administered 2015-06-03: 4 [IU] via SUBCUTANEOUS

## 2015-06-03 MED ORDER — SODIUM CHLORIDE 0.9 % IV SOLN
Freq: Once | INTRAVENOUS | Status: AC
Start: 2015-06-03 — End: 2015-06-09
  Administered 2015-06-09: 13:00:00 via INTRAVENOUS

## 2015-06-03 MED ORDER — SODIUM CHLORIDE 0.9 % IV SOLN
Freq: Once | INTRAVENOUS | Status: AC
  Administered 2015-06-03: 09:00:00 via INTRAVENOUS

## 2015-06-03 MED ORDER — PANTOPRAZOLE SODIUM 40 MG IV SOLR
40.0000 mg | Freq: Two times a day (BID) | INTRAVENOUS | Status: DC
  Administered 2015-06-06 – 2015-06-10 (×8): 40 mg via INTRAVENOUS
  Filled 2015-06-03 (×9): qty 40

## 2015-06-03 MED ORDER — METHYLPREDNISOLONE SODIUM SUCC 40 MG IJ SOLR
15.0000 mg | Freq: Four times a day (QID) | INTRAMUSCULAR | Status: DC
  Administered 2015-06-03 – 2015-06-07 (×19): 15.2 mg via INTRAVENOUS
  Administered 2015-06-08: 40 mg via INTRAVENOUS
  Administered 2015-06-08 – 2015-06-09 (×5): 15.2 mg via INTRAVENOUS
  Filled 2015-06-03 (×25): qty 1

## 2015-06-03 MED ORDER — SODIUM CHLORIDE 0.9 % IV SOLN
8.0000 mg/h | INTRAVENOUS | Status: AC
  Administered 2015-06-03 – 2015-06-05 (×7): 8 mg/h via INTRAVENOUS
  Filled 2015-06-03 (×8): qty 80

## 2015-06-03 NOTE — Progress Notes (Addendum)
[facility] NAME: Bethany Russell    MR#:  [id number]  DATE OF BIRTH:  05/25/1941  SUBJECTIVE:  CHIEF COMPLAINT:   Chief Complaint  Patient presents with  . Fever   -Transferred to intensive care unit due to frequent rectal bleeding, hypotensive today in the morning and anemic. Being transfused. Weak and not able to review systems.   REVIEW OF SYSTEMS:  Review of Systems  Unable to perform ROS: critical illness  Constitutional: Negative for fever and chills.  HENT: Negative for ear discharge and tinnitus.   Eyes: Negative for blurred vision.  Respiratory: Negative for cough, shortness of breath and wheezing.   Cardiovascular: Negative for chest pain and palpitations.  Gastrointestinal: Positive for nausea and abdominal pain. Negative for vomiting, diarrhea and constipation.  Genitourinary: Negative for dysuria.  Neurological: Positive for weakness. Negative for dizziness, sensory change, speech change, focal weakness, seizures and headaches.    DRUG ALLERGIES:  No Known Allergies  VITALS:  Blood pressure 87/51, pulse 91, temperature 98.5 F (36.9 C), temperature source Axillary, resp. rate 13, height 5\' 4"  (1.626 m), weight 59.24 kg (130 lb 9.6 oz), SpO2 100 %.  PHYSICAL EXAMINATION:  Physical Exam  GENERAL:  74 y.o.-year-old patient lying in the bed with no acute distress. Somnolent laying in the bed briefly opens her eyes, converses and then drifts back to sleep EYES: Pupils equal, round, reactive to light and accommodation. No scleral icterus. Extraocular muscles intact.  HEENT: Head atraumatic, normocephalic. Oropharynx and nasopharynx clear.  NECK:  Supple, no jugular venous distention. No thyroid enlargement, no tenderness.  LUNGS: Normal breath sounds bilaterally, no wheezing, rales,rhonchi or crepitation. No use of accessory muscles of respiration.  CARDIOVASCULAR: S1, S2 normal. No murmurs, rubs, or gallops.   ABDOMEN: Soft, minimal discomfort was felt on palpation in lower part of abdomen but no rebound or guarding was noted, nondistended. Bowel sounds present. No organomegaly or mass.  EXTREMITIES: No pedal edema, cyanosis, or clubbing.  NEUROLOGIC: Cranial nerves II through XII are intact. Muscle strength 5/5 in all extremities. Sensation intact. Gait not checked.  PSYCHIATRIC: The patient is somnolent , but oriented x 3.  SKIN: No obvious rash, lesion, or ulcer.    LABORATORY PANEL:   CBC  Recent Labs Lab 06/02/15 0618  06/03/15 1113  WBC 8.0  --   --   HGB 9.3*  < > 7.1*  HCT 29.7*  --   --   PLT 425  --   --   < > = values in this interval not displayed. ------------------------------------------------------------------------------------------------------------------  Chemistries   Recent Labs Lab 05/31/15 1544  06/02/15 0618  NA 134*  < > 139  K 4.2  < > 3.4*  CL 106  < > 111  CO2 19*  < > 22  GLUCOSE 186*  < > 109*  BUN 13  < > 8  CREATININE 1.41*  < > 1.12*  CALCIUM 8.1*  < > 7.7*  AST 25  --   --   ALT 11*  --   --   ALKPHOS 50  --   --   BILITOT 0.5  --   --   < > = values in this interval not displayed. ------------------------------------------------------------------------------------------------------------------  Cardiac Enzymes  Recent Labs Lab 05/31/15 1544  TROPONINI <0.03   ------------------------------------------------------------------------------------------------------------------  RADIOLOGY:  No results found.  EKG:   Orders placed or performed during the hospital encounter of 05/31/15  . ED  EKG 12-Lead  . ED EKG 12-Lead  . EKG 12-Lead  . EKG 12-Lead    ASSESSMENT AND PLAN:   74 year old female with known history of ulcerative colitis, hypertension diabetes presents to the hospital secondary to resistant Escherichia coli infection. 1. Acute posthemorrhagic anemia. Transfuse patient with 1 unit packed red blood cells following  her hemoglobin level and transfuse more if needed, check hemoglobin level tomorrow morning.   2 Acute gastrointestinal bleed, likely due to ulcerative colitis exacerbation, continue patient on steroids IV, gastroenterologist consultation is appreciated. Gastroenterologist will likely need EGD as well as colonoscopy when clinically feasible  3 Diarrhea, stool cultures for C. difficile and negative,   4 urinary tract infection-failed outpatient Macrobid. Escherichia coli was resistant to Cipro that she was on prior to that. -Blood cultures are negative. However urine cultures are positive for 6000 colony-forming units. Continue Rocephin for 5 days total  5 . Ulcerative colitis exacerbation- worsening abdominal pain, bloody diarrhea -Now off Flagyl, on prednisone chronically for immunosuppression, - continue mesalamine, steroids IV. -Also on Humira every 2 weeks.  GI consulted   6 Diabetes mellitus-continue Lantus lower than home dose and sliding scale insulin.  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 Currently stable. No active cardiac symptoms.  9 DVT prophylaxis not on subcutaneous heparin due to gastrointestinal bleed, now on SCDs  All the records are reviewed and case discussed with Care Management/Social Workerr. Management plans discussed with the patient, family and they are in agreement.  CODE STATUS:Full Code  TOTAL CRITICAL CARE TIME TAKING CARE OF THIS PATIENT: 31minutes   prolonged discussion this patient's daughter was present at the bedside about patient's treatment plan, evaluation and discharge planning . Time spent approximately 10 minutes.    Theodoro Grist M.D on 06/03/2015 at 2:30 PM  Between 7am to 6pm - Pager - [phone]  After 6pm go to [email or web address] - password [email or web address] [facility]  Office  [phone]  CC: Primary care physician; Ezequiel Kayser, MD

## 2015-06-03 NOTE — Progress Notes (Signed)
Patient had a brown liquid bowel movement.

## 2015-06-03 NOTE — Consult Note (Signed)
Subjective: Patient seen for possible ulcerative colitis flare. Events of last night noted. She has been transfused and hemoglobin has risen appropriately and is stable. He is been less nausea today after stopping Flagyl. She denies any abdominal pain although there is some occasional lower abdominal discomfort..  Objective: Vital signs in last 24 hours: Temp:  [97.8 F (36.6 C)-98.5 F (36.9 C)] 97.8 F (36.6 C) (09/02 1537) Pulse Rate:  [56-108] 84 (09/02 1700) Resp:  [12-26] 20 (09/02 1700) BP: (87-151)/(48-66) 112/50 mmHg (09/02 1700) SpO2:  [91 %-100 %] 100 % (09/02 1700) Blood pressure 112/50, pulse 84, temperature 97.8 F (36.6 C), temperature source Axillary, resp. rate 20, height 5\' 4"  (1.626 m), weight 59.24 kg (130 lb 9.6 oz), SpO2 100 %.   Intake/Output from previous day: 09/01 0701 - 09/02 0700 In: 600 [P.O.:600] Out: 0   Intake/Output this shift: Total I/O In: 386 [I.V.:10; Blood:376] Out: -    General appearance:  Elderly 74 year old female no acute distress Resp:  Bilaterally clear to auscultation Cardio:  Rate and rhythm without rub or gallop. Occasional PVC GI:  Soft nontender nondistended bowel sounds positive normoactive Extremities:  No clubbing, Cyanosis or edema Digital rectal examination shows a very thin/watery pale bloody effluent with some stool debris. Does not look fresh bleeding.   Lab Results: Results for orders placed or performed during the hospital encounter of 05/31/15 (from the past 24 hour(s))  C difficile quick scan w PCR reflex     Status: None   Collection Time: 06/02/15  7:56 PM  Result Value Ref Range   C Diff antigen NEGATIVE NEGATIVE   C Diff toxin NEGATIVE NEGATIVE   C Diff interpretation Negative for C. difficile   Glucose, capillary     Status: Abnormal   Collection Time: 06/02/15  9:12 PM  Result Value Ref Range   Glucose-Capillary 204 (H) 65 - 99 mg/dL   Comment 1 Notify RN   Hemoglobin     Status: Abnormal   Collection  Time: 06/03/15 12:20 AM  Result Value Ref Range   Hemoglobin 8.3 (L) 12.0 - 16.0 g/dL  Type and screen     Status: None (Preliminary result)   Collection Time: 06/03/15  2:38 AM  Result Value Ref Range   ABO/RH(D) O POS    Antibody Screen NEG    Sample Expiration 06/06/2015    Unit Number [id number]    Blood Component Type RBC LR PHER2    Unit division 00    Status of Unit ISSUED    Transfusion Status OK TO TRANSFUSE    Crossmatch Result Compatible    Unit Number [id number]    Blood Component Type RED CELLS,LR    Unit division 00    Status of Unit ALLOCATED    Transfusion Status OK TO TRANSFUSE    Crossmatch Result Compatible    Unit Number [id number]    Blood Component Type RBC LR PHER1    Unit division 00    Status of Unit ALLOCATED    Transfusion Status OK TO TRANSFUSE    Crossmatch Result Compatible   Prepare RBC     Status: None   Collection Time: 06/03/15  2:38 AM  Result Value Ref Range   Order Confirmation ORDER PROCESSED BY BLOOD BANK   Glucose, capillary     Status: Abnormal   Collection Time: 06/03/15  2:55 AM  Result Value Ref Range   Glucose-Capillary 131 (H) 65 - 99 mg/dL  Hemoglobin     Status:  Abnormal   Collection Time: 06/03/15  5:46 AM  Result Value Ref Range   Hemoglobin 7.2 (L) 12.0 - 16.0 g/dL  MRSA PCR Screening     Status: None   Collection Time: 06/03/15  6:45 AM  Result Value Ref Range   MRSA by PCR NEGATIVE NEGATIVE  Glucose, capillary     Status: Abnormal   Collection Time: 06/03/15  7:27 AM  Result Value Ref Range   Glucose-Capillary 126 (H) 65 - 99 mg/dL  Hemoglobin     Status: Abnormal   Collection Time: 06/03/15 11:13 AM  Result Value Ref Range   Hemoglobin 7.1 (L) 12.0 - 16.0 g/dL  Glucose, capillary     Status: Abnormal   Collection Time: 06/03/15 11:16 AM  Result Value Ref Range   Glucose-Capillary 188 (H) 65 - 99 mg/dL  Hemoglobin and hematocrit, blood     Status: Abnormal   Collection Time: 06/03/15  3:36 PM   Result Value Ref Range   Hemoglobin 9.6 (L) 12.0 - 16.0 g/dL   HCT 28.9 (L) 35.0 - 47.0 %  Glucose, capillary     Status: Abnormal   Collection Time: 06/03/15  4:29 PM  Result Value Ref Range   Glucose-Capillary 228 (H) 65 - 99 mg/dL      Recent Labs  05/31/15 2020 06/01/15 0657 06/02/15 0618  06/03/15 0546 06/03/15 1113 06/03/15 1536  WBC 8.7 6.8 8.0  --   --   --   --   HGB 8.8* 8.0* 9.3*  < > 7.2* 7.1* 9.6*  HCT 28.3* 25.5* 29.7*  --   --   --  28.9*  PLT 454* 417 425  --   --   --   --   < > = values in this interval not displayed. BMET  Recent Labs  05/31/15 2020 06/01/15 0657 06/02/15 0618  NA  --  132* 139  K  --  3.2* 3.4*  CL  --  107 111  CO2  --  19* 22  GLUCOSE  --  75 109*  BUN  --  10 8  CREATININE 1.22* 1.10* 1.12*  CALCIUM  --  6.9* 7.7*   LFT No results for input(s): PROT, ALBUMIN, AST, ALT, ALKPHOS, BILITOT, BILIDIR, IBILI in the last 72 hours. PT/INR No results for input(s): LABPROT, INR in the last 72 hours. Hepatitis Panel No results for input(s): HEPBSAG, HCVAB, HEPAIGM, HEPBIGM in the last 72 hours. C-Diff  Recent Labs  06/02/15 1956  CDIFFTOX NEGATIVE   No results for input(s): CDIFFPCR in the last 72 hours.   Studies/Results: No results found.  Scheduled Inpatient Medications:   . sodium chloride   Intravenous Once  . [START ON 06/07/2015] Adalimumab  40 mg Subcutaneous Q14 Days  . cefTRIAXone (ROCEPHIN)  IV  1 g Intravenous Q24H  . feeding supplement (GLUCERNA SHAKE)  237 mL Oral TID WC  . ferrous sulfate  325 mg Oral QHS  . insulin aspart  0-9 Units Subcutaneous TID WC  . insulin glargine  20 Units Subcutaneous Daily  . levothyroxine  25 mcg Oral QAC breakfast  . loperamide  2 mg Oral BID  . Mesalamine  800 mg Oral TID  . methylPREDNISolone (SOLU-MEDROL) injection  15.2 mg Intravenous Q6H  . metoprolol succinate  25 mg Oral Daily  . [START ON 06/06/2015] pantoprazole (PROTONIX) IV  40 mg Intravenous Q12H  . pravastatin   80 mg Oral QHS  . sertraline  50 mg Oral Daily  . [  START ON 06/05/2015] Vitamin D (Ergocalciferol)  50,000 Units Oral Weekly    Continuous Inpatient Infusions:   . pantoprozole (PROTONIX) infusion 8 mg/hr (06/03/15 1644)    PRN Inpatient Medications:  acetaminophen, ALPRAZolam, morphine injection, ondansetron (ZOFRAN) IV, oxyCODONE, traZODone  Miscellaneous:   Assessment:  1. Possible exacerbation of ulcerative colitis. Currently on IV steroids. Abdominal pain improved. 2. Hematochezia. Minimal recurrent bleeding over the course of the day, hemodynamically stable. Current rectal examination is likely most consistent with old blood that is band or even mild bleeding from ulcerative colitis.  Plan:  1. Continue current 2. If there is any evidence of thickened recurrent bleeding would do a bleeding scan as she does have a history of diverticulosis and this could be a secondary etiology as well as the ulcerative colitis. 3. Dr. Candace Cruise will see over the weekend.  Lollie Sails MD 06/03/2015, 6:09 PM

## 2015-06-03 NOTE — Progress Notes (Signed)
Nutrition Follow-up  DOCUMENTATION CODES:   Severe malnutrition in context of acute illness/injury  INTERVENTION:   Medical Food Supplement Therapy: continue Glucerna supplements Meals and Snacks: Cater to patient preferences, likes jello  NUTRITION DIAGNOSIS:   Inadequate oral intake related to acute illness, poor appetite as evidenced by per patient/family report.   GOAL:   Patient will meet greater than or equal to 90% of their needs  MONITOR:    (energy Intake, digestive System, Electrolyte and renal Profile, Anthropometrics, Skin)  REASON FOR ASSESSMENT:   Consult, Malnutrition Screening Tool Poor PO  ASSESSMENT:   Pt admitted with sepsis with UTI. Pt with h/o Ulcerative Colitis as well.  Pt transferred to ICU due to rectal bleeding. Pt sleeping on visit today, family at bedside  Diet Order:  Diet heart healthy/carb modified Room service appropriate?: Yes; Fluid consistency:: Thin   Energy Intake: pt had not touched lunch tray today, sleeping. Family reports pt drank Glucerna this AM but did not eat much else. Pt drinking Glucerna shakes; recorded po intake 30-60%.   Electrolyte and Renal Profile:  Recent Labs Lab 05/31/15 1544 05/31/15 2020 06/01/15 0657 06/02/15 0618  BUN 13  --  10 8  CREATININE 1.41* 1.22* 1.10* 1.12*  NA 134*  --  132* 139  K 4.2  --  3.2* 3.4*   Glucose Profile:  Recent Labs  06/03/15 0255 06/03/15 0727 06/03/15 1116  GLUCAP 131* 126* 188*   Nutritional Anemia Profile:  CBC Latest Ref Rng 06/03/2015 06/03/2015 06/03/2015  WBC 3.6 - 11.0 K/uL - - -  Hemoglobin 12.0 - 16.0 g/dL 7.1(L) 7.2(L) 8.3(L)  Hematocrit 35.0 - 47.0 % - - -  Platelets 150 - 440 K/uL - - -    Meds: ss novolog, lantus, solumedrol  Skin:   (Stage II pressure ulcer on heel)  Last BM:  06/01/2015  Height:   Ht Readings from Last 1 Encounters:  05/31/15 5\' 4"  (1.626 m)    Weight:   Wt Readings from Last 1 Encounters:  05/31/15 130 lb 9.6 oz (59.24 kg)     Ideal Body Weight:     BMI:  Body mass index is 22.41 kg/(m^2).  Estimated Nutritional Needs:   Kcal:  1408-1665kcals, BEE: 1067kcals, TEE: (IF 1.1-1.3)(AF 1.2)  Protein:  60-71g protein (1.0-1.2g/kg)  Fluid:  1480-1714mL of fluid (25-28mL/kg)  EDUCATION NEEDS:   No education needs identified at this time  [place], RD, LDN [phone] Pager

## 2015-06-03 NOTE — Progress Notes (Addendum)
Called by nursing that patient had second episode large bloody bowel movement and acute worsening general weakness.  Hgb dropped from 9 to 8 on last check, but has been fluctuating between 8 and 10 during this admission.  Vitals pending, 1L bolus [place] initiated, protonix drip ordered though lower GI bleed is likely given history of UC.  Will transfer patient to ICU and order type and screen, with possible trasfusion to be ordered based on vitals.  Blood products consent discussed and obtained form patient's daughter and HCPOA at patient's request.  Type and screen pending.  No transfusion order at this time, though suspect patient may likely need one relatively soon.  BP low end stable for now, continue to monitor serial Hgb.  Jacqulyn Bath [facility] 06/03/2015, 2:21 AM

## 2015-06-03 NOTE — Progress Notes (Signed)
MD notified of Mrs Bethany Russell second passing of large amount blood clots and bright red blood with acute worsening of weakness, symptomatic, BP drop to 90's/40's, tachycardic. Hgb drop from 9.3 to 8.3. New orders placed by MD and carried out by nursing. Report called to ICU and Mrs Bethany Russell transferred to ICU. Daughter at bedside.

## 2015-06-03 NOTE — Progress Notes (Signed)
Bethany Bethany Russell passed large amount of blood clots and bright red blood, c/o sudden pain "all over", neck, back, abdomen, stating, "it's killing me." Also, sudden increase in generalized weakness. BP 100/51, HR 90, Resp 22, )2 96% on RA. Dr Jannifer Franklin, MD on-call notified. Placed orders for STAT hemoglobin NOW and Q6, IV protonix, D/C anticoagulants w/ SCD prophylaxis. GI is consulting on Bethany Russell. PRN Morphine given for pain as ordered. Nursing continues to monitor, carry out orders, and assist with ADLs as needed. Daughter at bedside.

## 2015-06-03 NOTE — Progress Notes (Signed)
eLink Physician-Brief Progress Note Patient Name: Bethany Russell DOB: 28-Oct-1940 MRN: [id number]   Date of Service  06/03/2015  HPI/Events of Note  74 Y/O with UC presents with Abd pain. Transferred to ICU after 2 bloody BMs. Hemodynamically stable. GI on board.  eICU Interventions  No Elink interventions at this point     Intervention Category Evaluation Type: New Patient Evaluation  [name] 06/03/2015, 4:44 AM

## 2015-06-03 NOTE — Progress Notes (Signed)
Patient finger stick [phone].  No order for HS SSI  insulin.  Spoke with Dr.  Reece Levy.  Ordered 4 units insulin

## 2015-06-03 NOTE — Care Management Note (Signed)
Case Management Note  Patient Details  Name: Bethany Russell MRN: [id number] Date of Birth: [birth year]  Subjective/Objective:  Transferred to ICU following acute blood loss with large clots noted, hypotension and weakness.                   Action/Plan: RNCM to follow progress  Expected Discharge Date:                  Expected Discharge Plan:  Home/Self Care  In-House Referral:     Discharge planning Services  CM Consult  Post Acute Care Choice:    Choice offered to:     DME Arranged:    DME Agency:     HH Arranged:    HH Agency:     Status of Service:  In process, will continue to follow  Medicare Important Message Given:  Yes-second notification given Date Medicare IM Given:    Medicare IM give by:    Date Additional Medicare IM Given:    Additional Medicare Important Message give by:     If discussed at [place] of Stay Meetings, dates discussed:    Additional Comments:  Jolly Mango, RN 06/03/2015, 9:55 AM

## 2015-06-04 ENCOUNTER — Encounter: Payer: Self-pay | Admitting: Gastroenterology

## 2015-06-04 LAB — GLUCOSE, CAPILLARY
GLUCOSE-CAPILLARY: 272 mg/dL — AB (ref 65–99)
GLUCOSE-CAPILLARY: 346 mg/dL — AB (ref 65–99)
GLUCOSE-CAPILLARY: 336 mg/dL — AB (ref 65–99)
GLUCOSE-CAPILLARY: 258 mg/dL — AB (ref 65–99)

## 2015-06-04 LAB — BASIC METABOLIC PANEL
Anion gap: 5 (ref 5–15)
BUN: 10 mg/dL (ref 6–20)
CHLORIDE: 111 mmol/L (ref 101–111)
CO2: 21 mmol/L — AB (ref 22–32)
CREATININE: 1.07 mg/dL — AB (ref 0.44–1.00)
Calcium: 7.7 mg/dL — ABNORMAL LOW (ref 8.9–10.3)
GFR calc Af Amer: 57 mL/min — ABNORMAL LOW (ref >60)
GFR calc non Af Amer: 49 mL/min — ABNORMAL LOW (ref >60)
GLUCOSE: 282 mg/dL — AB (ref 65–99)
POTASSIUM: 4.4 mmol/L (ref 3.5–5.1)
SODIUM: 137 mmol/L (ref 135–145)

## 2015-06-04 LAB — HEMOGLOBIN
Hemoglobin: 9.2 g/dL — ABNORMAL LOW (ref 12.0–16.0)
Hemoglobin: 9.6 g/dL — ABNORMAL LOW (ref 12.0–16.0)
Hemoglobin: 8.2 g/dL — ABNORMAL LOW (ref 12.0–16.0)
Hemoglobin: 7.8 g/dL — ABNORMAL LOW (ref 12.0–16.0)

## 2015-06-04 MED ORDER — INSULIN GLARGINE 100 UNIT/ML ~~LOC~~ SOLN
26.0000 [IU] | Freq: Every day | SUBCUTANEOUS | Status: DC
  Administered 2015-06-05: 11:00:00 26 [IU] via SUBCUTANEOUS
  Filled 2015-06-04 (×3): qty 0.26

## 2015-06-04 MED ORDER — SODIUM CHLORIDE 0.9 % IV SOLN
INTRAVENOUS | Status: DC
  Administered 2015-06-04 – 2015-06-07 (×11): via INTRAVENOUS
  Administered 2015-06-08: 1000 mL via INTRAVENOUS
  Administered 2015-06-08: 08:00:00 via INTRAVENOUS

## 2015-06-04 NOTE — Plan of Care (Signed)
Problem: Discharge Progression Outcomes Goal: Other Discharge Outcomes/Goals Outcome: Progressing 1.Transfer from CCU after treatment of  sepsis and rectal bleeding complicated by hypotension probably related to ulcerative colitis. Three family members with pt and very active in her care. 2. Pt has dementia per family, calm, pleasant and follows commands and is experiencing generalized weakness. 3. Discharge planning in process. 4. Pain controlled with oxycodone prn with pt resting quietly with pain increase prior to stooling. 5. VSS. Elevated FSBS. Stools loose blackish brown with appearance of old blood. 6. IVFs started. Protonix drip continued. Will continue to assess for sign/symptom bleeding. Hgb up to 9.6 today. Telemetry NSR.  7. Clear liquid diet being tolerated with pt and family desiring advancement to full liquids which was done. 8. Up to [facility] with 1+ and tolerating well; will slowly advance activity. 9. Family remains with pt.

## 2015-06-04 NOTE — Consult Note (Signed)
  GI Inpatient Follow-up Note  Patient Identification: Bethany Russell is a 74 y.o. female with likely UC flare. Covering for Dr. Gustavo Lah.  Subjective: Still with watery diarrhea. Some bleeding though less. On IV solumedrol.  Scheduled Inpatient Medications:  . sodium chloride   Intravenous Once  . [START ON 06/07/2015] Adalimumab  40 mg Subcutaneous Q14 Days  . cefTRIAXone (ROCEPHIN)  IV  1 g Intravenous Q24H  . feeding supplement (GLUCERNA SHAKE)  237 mL Oral TID WC  . ferrous sulfate  325 mg Oral QHS  . insulin aspart  0-9 Units Subcutaneous TID WC  . insulin glargine  20 Units Subcutaneous Daily  . levothyroxine  25 mcg Oral QAC breakfast  . loperamide  2 mg Oral BID  . Mesalamine  800 mg Oral TID  . methylPREDNISolone (SOLU-MEDROL) injection  15.2 mg Intravenous Q6H  . metoprolol succinate  25 mg Oral Daily  . [START ON 06/06/2015] pantoprazole (PROTONIX) IV  40 mg Intravenous Q12H  . pravastatin  80 mg Oral QHS  . sertraline  50 mg Oral Daily  . [START ON 06/05/2015] Vitamin D (Ergocalciferol)  50,000 Units Oral Weekly    Continuous Inpatient Infusions:   . pantoprozole (PROTONIX) infusion 8 mg/hr (06/04/15 0600)    PRN Inpatient Medications:  acetaminophen, ALPRAZolam, morphine injection, ondansetron (ZOFRAN) IV, oxyCODONE, traZODone  Review of Systems: Constitutional: Weight is stable.  Eyes: No changes in vision. ENT: No oral lesions, sore throat.  GI: see HPI.  Heme/Lymph: No easy bruising.  CV: No chest pain.  GU: No hematuria.  Integumentary: No rashes.  Neuro: No headaches.  Psych: No depression/anxiety.  Endocrine: No heat/cold intolerance.  Allergic/Immunologic: No urticaria.  Resp: No cough, SOB.  Musculoskeletal: No joint swelling.    Physical Examination: BP 134/61 mmHg  Pulse 79  Temp(Src) 97.8 F (36.6 C) (Oral)  Resp 14  Ht 5\' 4"  (1.626 m)  Wt 59.24 kg (130 lb 9.6 oz)  BMI 22.41 kg/m2  SpO2 99% Gen: NAD, alert and oriented x 4 HEENT:  PEERLA, EOMI, Neck: supple, no JVD or thyromegaly Chest: CTA bilaterally, no wheezes, crackles, or other adventitious sounds CV: RRR, no m/g/c/r Abd: soft, NT, ND, +BS in all four quadrants; no HSM, guarding, ridigity, or rebound tenderness Ext: no edema, well perfused with 2+ pulses, Skin: no rash or lesions noted Lymph: no LAD  Data: Lab Results  Component Value Date   WBC 8.0 06/02/2015   HGB 9.2* 06/04/2015   HCT 28.9* 06/03/2015   MCV 73.2* 06/02/2015   PLT 425 06/02/2015    Recent Labs Lab 06/03/15 1536 06/03/15 2241 06/04/15 0539  HGB 9.6* 8.6* 9.2*   Lab Results  Component Value Date   NA 137 06/04/2015   K 4.4 06/04/2015   CL 111 06/04/2015   CO2 21* 06/04/2015   BUN 10 06/04/2015   CREATININE 1.07* 06/04/2015   Lab Results  Component Value Date   ALT 11* 05/31/2015   AST 25 05/31/2015   ALKPHOS 50 05/31/2015   BILITOT 0.5 05/31/2015   No results for input(s): APTT, INR, PTT in the last 168 hours. Assessment/Plan: Ms. Butner is a 74 y.o. female  With hx of UC on asacol and humira at home. Possible UC flare.  Recommendations: Continue IV solumedrol. Hopefully, sxs will gradually improve over next day or two. If not, plan full colonoscopy next week. Thanks. Please call with questions or concerns.  Skarlett Sedlacek, Lupita Dawn, MD

## 2015-06-04 NOTE — Progress Notes (Signed)
[place] [facility] [place] NAME: Bethany Russell    MR#:  [id number]  DATE OF BIRTH:  1941/05/20  SUBJECTIVE:  CHIEF COMPLAINT:   Chief Complaint  Patient presents with  . Fever   -Transferred to intensive care unit due to frequent rectal bleeding, hypotensive yesterday . Feels better today. Still has some abdominal pain but not as severe as it was yesterday, less bleeding, however, still watery diarrhea . Feels dehydrated with dry oral mucosa requests some ice chips . Was on a regular diet. Now downgraded to clear liquids  REVIEW OF SYSTEMS:  Review of Systems  Unable to perform ROS: critical illness  Constitutional: Negative for fever and chills.  HENT: Negative for ear discharge and tinnitus.   Eyes: Negative for blurred vision.  Respiratory: Negative for cough, shortness of breath and wheezing.   Cardiovascular: Negative for chest pain and palpitations.  Gastrointestinal: Positive for nausea and abdominal pain. Negative for vomiting, diarrhea and constipation.  Genitourinary: Negative for dysuria.  Neurological: Positive for weakness. Negative for dizziness, sensory change, speech change, focal weakness, seizures and headaches.    DRUG ALLERGIES:  No Known Allergies  VITALS:  Blood pressure 121/60, pulse 79, temperature 98 F (36.7 C), temperature source Oral, resp. rate 20, height 5\' 4"  (1.626 m), weight 59.24 kg (130 lb 9.6 oz), SpO2 97 %.  PHYSICAL EXAMINATION:  Physical Exam  GENERAL:  74 y.o.-year-old patient lying in the bed with no acute distress. Moderately uncomfortable due to dry mouth as well as abdominal pain  EYES: Pupils equal, round, reactive to light and accommodation. No scleral icterus. Extraocular muscles intact.  HEENT: Head atraumatic, normocephalic. Oropharynx and nasopharynx clear. Dry oral mucosa  NECK:  Supple, no jugular venous distention. No thyroid enlargement, no tenderness.  LUNGS: Normal breath  sounds bilaterally, no wheezing, rales,rhonchi or crepitation. No use of accessory muscles of respiration.  CARDIOVASCULAR: S1, S2 normal. No murmurs, rubs, or gallops.  ABDOMEN: Soft, minimal discomfort was felt on palpation in lower part of abdomen but no rebound or guarding was noted, nondistended. Bowel sounds present, diminished  No organomegaly or mass.  EXTREMITIES: No pedal edema, cyanosis, or clubbing.  NEUROLOGIC: Cranial nerves II through XII are intact. Muscle strength 5/5 in all extremities. Sensation intact. Gait not checked.  PSYCHIATRIC: The patientis alert,  however confused.  SKIN: No obvious rash, lesion, or ulcer.    LABORATORY PANEL:   CBC  Recent Labs Lab 06/02/15 0618  06/03/15 1536  06/04/15 1037  WBC 8.0  --   --   --   --   HGB 9.3*  < > 9.6*  < > 9.6*  HCT 29.7*  --  28.9*  --   --   PLT 425  --   --   --   --   < > = values in this interval not displayed. ------------------------------------------------------------------------------------------------------------------  Chemistries   Recent Labs Lab 05/31/15 1544  06/04/15 0539  NA 134*  < > 137  K 4.2  < > 4.4  CL 106  < > 111  CO2 19*  < > 21*  GLUCOSE 186*  < > 282*  BUN 13  < > 10  CREATININE 1.41*  < > 1.07*  CALCIUM 8.1*  < > 7.7*  AST 25  --   --   ALT 11*  --   --   ALKPHOS 50  --   --   BILITOT 0.5  --   --   < > =  values in this interval not displayed. ------------------------------------------------------------------------------------------------------------------  Cardiac Enzymes  Recent Labs Lab 05/31/15 1544  TROPONINI <0.03   ------------------------------------------------------------------------------------------------------------------  RADIOLOGY:  No results found.  EKG:   Orders placed or performed during the hospital encounter of 05/31/15  . ED EKG 12-Lead  . ED EKG 12-Lead  . EKG 12-Lead  . EKG 12-Lead    ASSESSMENT AND PLAN:   74 year old female  with known history of ulcerative colitis, hypertension diabetes presents to the hospital secondary to resistant Escherichia coli infection.  1. Acute posthemorrhagic anemia. Transfused  1 unit packed red blood cells yesterday with improvement of  hemoglobin level ,  check hemoglobin level tonight as well as  tomorrow morning.   2 Acute gastrointestinal bleed, likely due to ulcerative colitis exacerbation,  rule out diverticular bleed , continue patient on steroids IV, gastroenterologist consultation is appreciated. Gastroenterologist  feels patient will likely need EGD as well as colonoscopy when clinically feasible. Get a bleeding scan if significant bleed  3 Diarrhea, stool cultures for C. Difficile are negative, awaiting for stool cultures   4 urinary tract infection-failed outpatient Macrobid. Escherichia coli was resistant to Cipro that she was on prior to that. -Blood cultures are negative. However urine cultures are positive for 6000 colony-forming units.Discontinue Rocephin. Due to insignificant growth  5 . Ulcerative colitis exacerbation- worsening abdominal pain, bloody diarrhea -Now off Flagyl, on prednisone chronically for immunosuppression, - continue mesalamine, steroids IV. -Also on Humira every 2 weeks.  GI consulted   6 Diabetes mellitus, advance  Lantus to home dose and sliding scale insulin.  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 Currently stable. No active cardiac symptoms.  9 DVT prophylaxis not on subcutaneous heparin due to gastrointestinal bleed, now on SCDs  All the records are reviewed and case discussed with Care Management/Social Workerr. Management plans discussed with the patient, family and they are in agreement.  CODE STATUS:Full Code  TOTAL CRITICAL CARE TIME TAKING CARE OF THIS PATIENT: 40 minutes  . Patient's daughter is updated.    Theodoro Grist M.D on 06/04/2015 at 12:51 PM  Between 7am to 6pm - Pager -  [phone]  After 6pm go to [email or web address] - password [email or web address] [place] [facility]  Office  [phone]  CC: Primary care physician; Ezequiel Kayser, MD

## 2015-06-05 LAB — CULTURE, BLOOD (ROUTINE X 2)
CULTURE: NO GROWTH
CULTURE: NO GROWTH

## 2015-06-05 LAB — HEMOGLOBIN
HEMOGLOBIN: 8.4 g/dL — AB (ref 12.0–16.0)
HEMOGLOBIN: 8.1 g/dL — AB (ref 12.0–16.0)
HEMOGLOBIN: 7.5 g/dL — AB (ref 12.0–16.0)
Hemoglobin: 8.4 g/dL — ABNORMAL LOW (ref 12.0–16.0)

## 2015-06-05 LAB — GLUCOSE, CAPILLARY
GLUCOSE-CAPILLARY: 353 mg/dL — AB (ref 65–99)
GLUCOSE-CAPILLARY: 353 mg/dL — AB (ref 65–99)
Glucose-Capillary: 270 mg/dL — ABNORMAL HIGH (ref 65–99)
Glucose-Capillary: 364 mg/dL — ABNORMAL HIGH (ref 65–99)
Glucose-Capillary: 349 mg/dL — ABNORMAL HIGH (ref 65–99)

## 2015-06-05 MED ORDER — APIXABAN 5 MG PO TABS: 10.0000 mg | ORAL_TABLET | Freq: Two times a day (BID) | ORAL | Status: DC

## 2015-06-05 MED ORDER — APIXABAN 5 MG PO TABS: 5.0000 mg | ORAL_TABLET | Freq: Two times a day (BID) | ORAL | Status: DC

## 2015-06-05 MED ORDER — INSULIN GLARGINE 100 UNIT/ML ~~LOC~~ SOLN
35.0000 [IU] | Freq: Every day | SUBCUTANEOUS | Status: DC
  Administered 2015-06-06 – 2015-06-07 (×2): 35 [IU] via SUBCUTANEOUS
  Filled 2015-06-05 (×3): qty 0.35

## 2015-06-05 NOTE — Progress Notes (Signed)
[place] at [place] NAME: Bethany Russell    MR#:  [id number]  DATE OF BIRTH:  1941/04/11  SUBJECTIVE:  CHIEF COMPLAINT:   Chief Complaint  Patient presents with  . Fever   -Transferred to intensive care unit due to frequent rectal bleeding,  Hypotension, abdominal pain, watery diarrhea . Feels  Today with  Less  Diarrheal stool or bleeding.  On full liquid diet. Now.  Hemoglobin level remains stable.  Complains of abdominal pain diffusely,  Better than before REVIEW OF SYSTEMS:  Review of Systems  Unable to perform ROS: critical illness  Constitutional: Negative for fever and chills.  HENT: Negative for ear discharge and tinnitus.   Eyes: Negative for blurred vision.  Respiratory: Negative for cough, shortness of breath and wheezing.   Cardiovascular: Negative for chest pain and palpitations.  Gastrointestinal: Positive for nausea and abdominal pain. Negative for vomiting, diarrhea and constipation.  Genitourinary: Negative for dysuria.  Neurological: Positive for weakness. Negative for dizziness, sensory change, speech change, focal weakness, seizures and headaches.    DRUG ALLERGIES:  No Known Allergies  VITALS:  Blood pressure 117/47, pulse 75, temperature 98.9 F (37.2 C), temperature source Oral, resp. rate 18, height 5\' 4"  (1.626 m), weight 63.186 kg (139 lb 4.8 oz), SpO2 99 %.  PHYSICAL EXAMINATION:  Physical Exam  GENERAL:  74 y.o.-year-old patient lying in the bed with no acute distress. Moderately uncomfortable due to dry mouth as well as abdominal pain  EYES: Pupils equal, round, reactive to light and accommodation. No scleral icterus. Extraocular muscles intact.  HEENT: Head atraumatic, normocephalic. Oropharynx and nasopharynx clear. Dry oral mucosa  NECK:  Supple, no jugular venous distention. No thyroid enlargement, no tenderness.  LUNGS: Normal breath sounds bilaterally, no wheezing, rales,rhonchi or  crepitation. No use of accessory muscles of respiration.  CARDIOVASCULAR: S1, S2 normal. No murmurs, rubs, or gallops.  ABDOMEN: Soft, minimal discomfort was felt on palpation in lower part of abdomen but no rebound or guarding was noted, nondistended. Bowel sounds present, diminished  No organomegaly or mass.  EXTREMITIES: No pedal edema, cyanosis, or clubbing.  NEUROLOGIC: Cranial nerves II through XII are intact. Muscle strength 5/5 in all extremities. Sensation intact. Gait not checked.  PSYCHIATRIC: The patientis alert,  however confused.  SKIN: No obvious rash, lesion, or ulcer.    LABORATORY PANEL:   CBC  Recent Labs Lab 06/02/15 0618  06/03/15 1536  06/05/15 1042  WBC 8.0  --   --   --   --   HGB 9.3*  < > 9.6*  < > 8.4*  HCT 29.7*  --  28.9*  --   --   PLT 425  --   --   --   --   < > = values in this interval not displayed. ------------------------------------------------------------------------------------------------------------------  Chemistries   Recent Labs Lab 05/31/15 1544  06/04/15 0539  NA 134*  < > 137  K 4.2  < > 4.4  CL 106  < > 111  CO2 19*  < > 21*  GLUCOSE 186*  < > 282*  BUN 13  < > 10  CREATININE 1.41*  < > 1.07*  CALCIUM 8.1*  < > 7.7*  AST 25  --   --   ALT 11*  --   --   ALKPHOS 50  --   --   BILITOT 0.5  --   --   < > = values in this  interval not displayed. ------------------------------------------------------------------------------------------------------------------  Cardiac Enzymes  Recent Labs Lab 05/31/15 1544  TROPONINI <0.03   ------------------------------------------------------------------------------------------------------------------  RADIOLOGY:  No results found.  EKG:   Orders placed or performed during the hospital encounter of 05/31/15  . ED EKG 12-Lead  . ED EKG 12-Lead  . EKG 12-Lead  . EKG 12-Lead    ASSESSMENT AND PLAN:   74 year old female with known history of ulcerative colitis,  hypertension diabetes presents to the hospital secondary to resistant Escherichia coli infection.  1. Acute posthemorrhagic anemia. Transfused  1 unit packed red blood cells  Day before yesterday with improvement of  hemoglobin level ,   followinghemoglobin level  tomorrow morning.   2 Acute gastrointestinal bleed, likely due to ulcerative colitis exacerbation,  rule out diverticular bleed , continue patient on steroids IV, gastroenterologist consultation is appreciated. Gastroenterologist   Is planning colonoscopy later this week.   3 Diarrhea, stool cultures for C. Difficile are negative, stool cultures  Are not reported  4 urinary tract infection  Due to Escherichia coli-failed outpatient Macrobid. Escherichia coli was resistant to Cipro that she was on prior to that.-Blood cultures are negative. However urine cultures are positive for 6000 colony-forming units. Of Rocephin due to insignificant growth  5 . Ulcerative colitis exacerbation- worsening abdominal pain, bloody diarrhea -Now off Flagyl, was on prednisone chronically for immunosuppression, now on steroids IV,  Improving clinically - continue mesalamine, steroids IV. -Also on Humira every 2 weeks.  GI consulted,  Appreciated input,  possible colonoscopy later this week versus outpatient   6 Diabetes mellitus, advance  Lantus ,  continue sliding scale insulin. Blood glucose levels are poorly controlled due to steroids.   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 Currently stable. No active cardiac symptoms.  9 DVT prophylaxis not on subcutaneous heparin due to gastrointestinal bleed, now on SCDs  All the records are reviewed and case discussed with Care Management/Social Workerr. Management plans discussed with the patient, family and they are in agreement.  CODE STATUS:Full Code  TOTAL CRITICAL CARE TIME TAKING CARE OF THIS PATIENT: 40 minutes  . Patient's daughter is updated,  All  questions answered,  Emotional support was provided ,  She voiced appreciation.    Theodoro Grist M.D on 06/05/2015 at 11:49 AM  Between 7am to 6pm - Pager - [phone]  After 6pm go to [email or web address] - password [email or web address] [place] [facility]  Office  [phone]  CC: Primary care physician; Ezequiel Kayser, MD

## 2015-06-05 NOTE — Plan of Care (Addendum)
Problem: Discharge Progression Outcomes Goal: Other Discharge Outcomes/Goals Outcome: Progressing Plan of care progress to goals: Barriers- pt is alert and oriented at present, forgetful at times. Discharge plan- pt lives with daughter at home, discharge plan in progress Pain- no c/o pain noted this shift.  Hemodynamically stable-VSS, Hgb q6hrs last result down to 7.8. Complications-Hgb dropped slightly from 8.2 to 7.8, continue to monitor q6hrs hgb. Telemetry NSR with PVC's.  Diet- tolerating diet, no c/o nausea or vomiting. Activity-pt up to [facility] with 1 assist.  A&O pt, rested well this shift. C/o anxiety, improved with PRN xanax. IVF and IV protonix infusing per order. Hgb down to 7.8, continue to monitor AM labs. VSS. High fall risk.

## 2015-06-05 NOTE — Consult Note (Signed)
  GI Inpatient Follow-up Note  Patient Identification: Bethany Russell is a 74 y.o. female with uc flare.  Subjective: Diarrhea overall improving. 2-3 loose stools yesterday. None overnight. After breakfast this AM, had some abdominal cramping, followed by bloody diarrhea. Hgb stable.  Scheduled Inpatient Medications:  . sodium chloride   Intravenous Once  . [START ON 06/07/2015] Adalimumab  40 mg Subcutaneous Q14 Days  . feeding supplement (GLUCERNA SHAKE)  237 mL Oral TID WC  . ferrous sulfate  325 mg Oral QHS  . insulin aspart  0-9 Units Subcutaneous TID WC  . insulin glargine  26 Units Subcutaneous Daily  . levothyroxine  25 mcg Oral QAC breakfast  . loperamide  2 mg Oral BID  . Mesalamine  800 mg Oral TID  . methylPREDNISolone (SOLU-MEDROL) injection  15.2 mg Intravenous Q6H  . metoprolol succinate  25 mg Oral Daily  . [START ON 06/06/2015] pantoprazole (PROTONIX) IV  40 mg Intravenous Q12H  . pravastatin  80 mg Oral QHS  . sertraline  50 mg Oral Daily  . Vitamin D (Ergocalciferol)  50,000 Units Oral Weekly    Continuous Inpatient Infusions:   . sodium chloride 125 mL/hr at 06/05/15 0400  . pantoprozole (PROTONIX) infusion 8 mg/hr (06/05/15 0456)    PRN Inpatient Medications:  acetaminophen, ALPRAZolam, morphine injection, ondansetron (ZOFRAN) IV, oxyCODONE, traZODone  Review of Systems: Constitutional: Weight is stable.  Eyes: No changes in vision. ENT: No oral lesions, sore throat.  GI: see HPI.  Heme/Lymph: No easy bruising.  CV: No chest pain.  GU: No hematuria.  Integumentary: No rashes.  Neuro: No headaches.  Psych: No depression/anxiety.  Endocrine: No heat/cold intolerance.  Allergic/Immunologic: No urticaria.  Resp: No cough, SOB.  Musculoskeletal: No joint swelling.    Physical Examination: BP 117/47 mmHg  Pulse 75  Temp(Src) 98.9 F (37.2 C) (Oral)  Resp 18  Ht 5\' 4"  (1.626 m)  Wt 63.186 kg (139 lb 4.8 oz)  BMI 23.90 kg/m2  SpO2 99% Gen:  NAD, alert and oriented x 4 HEENT: PEERLA, EOMI, Neck: supple, no JVD or thyromegaly Chest: CTA bilaterally, no wheezes, crackles, or other adventitious sounds CV: RRR, no m/g/c/r Abd: soft, mild lower abdominal tenderness, ND, +BS in all four quadrants; no HSM, guarding, ridigity, or rebound tenderness Ext: no edema, well perfused with 2+ pulses, Skin: no rash or lesions noted Lymph: no LAD  Data: Lab Results  Component Value Date   WBC 8.0 06/02/2015   HGB 8.4* 06/05/2015   HCT 28.9* 06/03/2015   MCV 73.2* 06/02/2015   PLT 425 06/02/2015    Recent Labs Lab 06/04/15 1644 06/04/15 2256 06/05/15 0546  HGB 8.2* 7.8* 8.4*   Lab Results  Component Value Date   NA 137 06/04/2015   K 4.4 06/04/2015   CL 111 06/04/2015   CO2 21* 06/04/2015   BUN 10 06/04/2015   CREATININE 1.07* 06/04/2015   Lab Results  Component Value Date   ALT 11* 05/31/2015   AST 25 05/31/2015   ALKPHOS 50 05/31/2015   BILITOT 0.5 05/31/2015   No results for input(s): APTT, INR, PTT in the last 168 hours. Assessment/Plan: Ms. Carmicheal is a 74 y.o. female wit uc flare. Finally, starting to turn around.   Recommendations: Continue IV steroids. Hold off on colonoscopy until later this week since pt is starting to improve. thanks Please call with questions or concerns.  Brizza Nathanson, Lupita Dawn, MD

## 2015-06-05 NOTE — Plan of Care (Signed)
Problem: Discharge Progression Outcomes Goal: Other Discharge Outcomes/Goals Outcome: Progressing VSS. Hgb 8.4. 2xliquid brown-reddish stools during the shift. No c/o n/v. Fair appetite. Oxycodone given once for abd pain with good effect. Ambulates with 1xassist to the nursing station. Tolerates well. Daughter at the bedside.

## 2015-06-06 ENCOUNTER — Encounter: Payer: Self-pay | Admitting: Gastroenterology

## 2015-06-06 LAB — TYPE AND SCREEN
ABO/RH(D): O POS
Antibody Screen: NEGATIVE
UNIT DIVISION: 0
Unit division: 0
Unit division: 0

## 2015-06-06 LAB — GLUCOSE, CAPILLARY
GLUCOSE-CAPILLARY: 360 mg/dL — AB (ref 65–99)
GLUCOSE-CAPILLARY: 379 mg/dL — AB (ref 65–99)
Glucose-Capillary: 359 mg/dL — ABNORMAL HIGH (ref 65–99)
Glucose-Capillary: 412 mg/dL — ABNORMAL HIGH (ref 65–99)
Glucose-Capillary: 326 mg/dL — ABNORMAL HIGH (ref 65–99)

## 2015-06-06 LAB — HEMOGLOBIN
HEMOGLOBIN: 7.6 g/dL — AB (ref 12.0–16.0)
Hemoglobin: 8.1 g/dL — ABNORMAL LOW (ref 12.0–16.0)
Hemoglobin: 8 g/dL — ABNORMAL LOW (ref 12.0–16.0)
Hemoglobin: 7.7 g/dL — ABNORMAL LOW (ref 12.0–16.0)

## 2015-06-06 NOTE — Progress Notes (Signed)
   06/06/15 0945  Clinical Encounter Type  Visited With Patient and family together  Visit Type Initial  Provided pastoral support and presence to patient and family member.  Family member said patient was "doing much better today than yesterday."  Family member thanked me for my visit.  [place] [phone]

## 2015-06-06 NOTE — Plan of Care (Signed)
Problem: Discharge Progression Outcomes Goal: Other Discharge Outcomes/Goals Outcome: Progressing Plan of care progress to goals: Barriers- pt is alert, pleasantly confused at present.   Discharge plan- pt lives with daughter at home, discharge plan in progress Pain- c/o abd pain, improved with PRN pain medication.    Hemodynamically stable-VSS, Hgb q6hrs. Hgb Sustaining. Complications-Hgb Sustaining. Hgb Q6hrs. NSR with PVC's. Pain medicine given with improvement noted. Diet-Increased to Soft Diet. Tolerating diet. No c/o nausea or vomiting. Activity-Up to [facility] with 1 assist.

## 2015-06-06 NOTE — Care Management Important Message (Signed)
Important Message  Patient Details  Name: Bethany Russell MRN: [id number] Date of Birth: 1941/03/26   Medicare Important Message Given:  Yes-third notification given    Shelbie Ammons, RN 06/06/2015, 9:13 AM

## 2015-06-06 NOTE — Plan of Care (Signed)
Problem: Discharge Progression Outcomes Goal: Other Discharge Outcomes/Goals Outcome: Progressing Plan of care progress to goals: Barriers- pt is alert, pleasantly confused at present.  Discharge plan- pt lives with daughter at home, discharge plan in progress Pain- c/o abd pain, improved with PRN pain medication.   Hemodynamically stable-VSS, Hgb q6hrs last result down to 7.5 Complications-Hgb down to 7.5, continue to monitor q6hrs hgb. Telemetry NSR with PVC's.  c/o anxiety, improved with PRN xanax. Diet- tolerating diet, no c/o nausea or vomiting. Activity-pt up to [facility] with 1 assist.  Alert but pleasantly confused pt, rested well this shift. C/o abd pain, improved with PRN pain medication. C/o anxiety, improved with PRN xanax. IVF and IV protonix infusing per order. Hgb down to 7.5, continue to monitor AM labs. VSS. High fall risk. BM this shift continues to be redish/brown in color. Family at bedside.

## 2015-06-06 NOTE — Care Management (Signed)
Lives with daughter, [name], in the home [phone]). Last seen Dr. Raechel Ache in May. No home health in the past. [facility] x 1 month 2011. Uses a cane to aid in ambulation. No falls, good appetite.  Self feeder and dresses herself. Daughter states "has to be prompted to take medications."  Protonix continues. Two daughters at the bedside. Shelbie Ammons RN MSN Care Management [phone]

## 2015-06-06 NOTE — Progress Notes (Signed)
[facility] [place] NAME: Bethany Russell    MR#:  [id number]  DATE OF BIRTH:  [birth year]  SUBJECTIVE:  CHIEF COMPLAINT:   Chief Complaint  Patient presents with  . Fever   -Transferred to intensive care unit due to frequent rectal bleeding,  Hypotension, abdominal pain, watery diarrhea . Feels  Today with  Less  Diarrheal stool or bleeding.  On full liquid diet. Now.  Hemoglobin level remains stable.  Less pain. Feels better   REVIEW OF SYSTEMS:  Review of Systems  Unable to perform ROS: critical illness  Constitutional: Negative for fever and chills.  HENT: Negative for ear discharge and tinnitus.   Eyes: Negative for blurred vision.  Respiratory: Negative for cough, shortness of breath and wheezing.   Cardiovascular: Negative for chest pain and palpitations.  Gastrointestinal: Positive for nausea and abdominal pain. Negative for vomiting, diarrhea and constipation.  Genitourinary: Negative for dysuria.  Neurological: Positive for weakness. Negative for dizziness, sensory change, speech change, focal weakness, seizures and headaches.    DRUG ALLERGIES:  No Known Allergies  VITALS:  Blood pressure 118/84, pulse 69, temperature 98 F (36.7 C), temperature source Oral, resp. rate 18, height 5\' 4"  (1.626 m), weight 63.186 kg (139 lb 4.8 oz), SpO2 98 %.  PHYSICAL EXAMINATION:  Physical Exam  GENERAL:  74 y.o.-year-old patient lying in the bed with no acute distress. Sitting on the bedside commode, comfortable EYES: Pupils equal, round, reactive to light and accommodation. No scleral icterus. Extraocular muscles intact.  HEENT: Head atraumatic, normocephalic. Oropharynx and nasopharynx clear. Dry oral mucosa  NECK:  Supple, no jugular venous distention. No thyroid enlargement, no tenderness.  LUNGS: Normal breath sounds bilaterally, no wheezing, rales,rhonchi or crepitation. No use of accessory muscles of respiration.   CARDIOVASCULAR: S1, S2 normal. No murmurs, rubs, or gallops.  ABDOMEN: Soft, nontender, nondistended. Bowel sounds present, diminished  No organomegaly or mass.  EXTREMITIES: No pedal edema, cyanosis, or clubbing.  NEUROLOGIC: Cranial nerves II through XII are intact. Muscle strength 5/5 in all extremities. Sensation intact. Gait not checked.  PSYCHIATRIC: The patientis alert  SKIN: No obvious rash, lesion, or ulcer.    LABORATORY PANEL:   CBC  Recent Labs Lab 06/02/15 0618  06/03/15 1536  06/06/15 0504  WBC 8.0  --   --   --   --   HGB 9.3*  < > 9.6*  < > 8.1*  HCT 29.7*  --  28.9*  --   --   PLT 425  --   --   --   --   < > = values in this interval not displayed. ------------------------------------------------------------------------------------------------------------------  Chemistries   Recent Labs Lab 05/31/15 1544  06/04/15 0539  NA 134*  < > 137  K 4.2  < > 4.4  CL 106  < > 111  CO2 19*  < > 21*  GLUCOSE 186*  < > 282*  BUN 13  < > 10  CREATININE 1.41*  < > 1.07*  CALCIUM 8.1*  < > 7.7*  AST 25  --   --   ALT 11*  --   --   ALKPHOS 50  --   --   BILITOT 0.5  --   --   < > = values in this interval not displayed. ------------------------------------------------------------------------------------------------------------------  Cardiac Enzymes  Recent Labs Lab 05/31/15 1544  TROPONINI <0.03   ------------------------------------------------------------------------------------------------------------------  RADIOLOGY:  No results found.  EKG:  Orders placed or performed during the hospital encounter of 05/31/15  . ED EKG 12-Lead  . ED EKG 12-Lead  . EKG 12-Lead  . EKG 12-Lead    ASSESSMENT AND PLAN:   74 year old female with known history of ulcerative colitis, hypertension diabetes presents to the hospital secondary to resistant Escherichia coli infection.  1. Acute posthemorrhagic anemia. Transfused  1 unit packed red blood cells  with  improvement of  hemoglobin level ,   Following hemoglobin level daily, it is stable.   2 Acute gastrointestinal bleed, likely due to ulcerative colitis exacerbation,  rule out diverticular bleed , continue patient on steroids IV, gastroenterologist consultation is appreciated. Gastroenterologist   Is planning colonoscopy later this week.   3 Diarrhea, stool cultures for C. Difficile are negative, stool cultures  Are not reported  4 urinary tract infection  Due to Escherichia coli-failed outpatient Macrobid. Escherichia coli was resistant to Cipro that she was on prior to that.-Blood cultures are negative. However urine cultures are positive for 6000 colony-forming units. Of Rocephin due to insignificant growth  5 . Ulcerative colitis exacerbation- abdominal pain, bloody diarrhea -Now off Flagyl, was on prednisone chronically for immunosuppression, now on steroids IV,  Improving clinically - continue mesalamine, steroids IV. -Also on Humira every 2 weeks.  GI consulted,  Appreciated input,  possible colonoscopy later this week    6 Diabetes mellitus, advanced  Lantus ,  continue sliding scale insulin. Blood glucose levels are poorly controlled due to steroids.   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 Currently stable. No active cardiac symptoms.  9 DVT prophylaxis not on subcutaneous heparin due to gastrointestinal bleed, now on SCDs  All the records are reviewed and case discussed with Care Management/Social Workerr. Management plans discussed with the patient, family and they are in agreement.  CODE STATUS:Full Code  TOTAL CRITICAL CARE TIME TAKING CARE OF THIS PATIENT: 38minutes  . Patient's daughter is updated at the Dennison Bulla M.D on 06/06/2015 at 8:42 AM  Between 7am to 6pm - Pager - [phone]  After 6pm go to [email or web address] - password [email or web address] [facility]  Office  [phone]  CC: Primary care  physician; Ezequiel Kayser, MD

## 2015-06-06 NOTE — Consult Note (Signed)
GI Inpatient Follow-up Note  Patient Identification: Bethany Russell is a 74 y.o. female  Subjective: Contnues to improve. Getting out of bed and ambulating. Tolerating full liquid diet. 6-7 soft BM's yesterday. This is normal for pt at home. None last night. 2-3 soft BM's after breakfast this AM. Last BM brown in color. No more gross blood per rectum.  Scheduled Inpatient Medications:  . sodium chloride   Intravenous Once  . [START ON 06/07/2015] Adalimumab  40 mg Subcutaneous Q14 Days  . feeding supplement (GLUCERNA SHAKE)  237 mL Oral TID WC  . ferrous sulfate  325 mg Oral QHS  . insulin aspart  0-9 Units Subcutaneous TID WC  . insulin glargine  35 Units Subcutaneous Daily  . levothyroxine  25 mcg Oral QAC breakfast  . loperamide  2 mg Oral BID  . Mesalamine  800 mg Oral TID  . methylPREDNISolone (SOLU-MEDROL) injection  15.2 mg Intravenous Q6H  . metoprolol succinate  25 mg Oral Daily  . pantoprazole (PROTONIX) IV  40 mg Intravenous Q12H  . pravastatin  80 mg Oral QHS  . sertraline  50 mg Oral Daily  . Vitamin D (Ergocalciferol)  50,000 Units Oral Weekly    Continuous Inpatient Infusions:   . sodium chloride 125 mL/hr at 06/06/15 0141    PRN Inpatient Medications:  acetaminophen, ALPRAZolam, morphine injection, ondansetron (ZOFRAN) IV, oxyCODONE, traZODone  Review of Systems: Constitutional: Weight is stable.  Eyes: No changes in vision. ENT: No oral lesions, sore throat.  GI: see HPI.  Heme/Lymph: No easy bruising.  CV: No chest pain.  GU: No hematuria.  Integumentary: No rashes.  Neuro: No headaches.  Psych: No depression/anxiety.  Endocrine: No heat/cold intolerance.  Allergic/Immunologic: No urticaria.  Resp: No cough, SOB.  Musculoskeletal: No joint swelling.    Physical Examination: BP 118/84 mmHg  Pulse 69  Temp(Src) 98 F (36.7 C) (Oral)  Resp 18  Ht 5\' 4"  (1.626 m)  Wt 63.186 kg (139 lb 4.8 oz)  BMI 23.90 kg/m2  SpO2 98% Gen: NAD, alert and  oriented x 4 HEENT: PEERLA, EOMI, Neck: supple, no JVD or thyromegaly Chest: CTA bilaterally, no wheezes, crackles, or other adventitious sounds CV: RRR, no m/g/c/r Abd: soft, NT, ND, +BS in all four quadrants; no HSM, guarding, ridigity, or rebound tenderness Ext: no edema, well perfused with 2+ pulses, Skin: no rash or lesions noted Lymph: no LAD  Data: Lab Results  Component Value Date   WBC 8.0 06/02/2015   HGB 8.1* 06/06/2015   HCT 28.9* 06/03/2015   MCV 73.2* 06/02/2015   PLT 425 06/02/2015    Recent Labs Lab 06/05/15 1657 06/05/15 2308 06/06/15 0504  HGB 8.1* 7.5* 8.1*   Lab Results  Component Value Date   NA 137 06/04/2015   K 4.4 06/04/2015   CL 111 06/04/2015   CO2 21* 06/04/2015   BUN 10 06/04/2015   CREATININE 1.07* 06/04/2015   Lab Results  Component Value Date   ALT 11* 05/31/2015   AST 25 05/31/2015   ALKPHOS 50 05/31/2015   BILITOT 0.5 05/31/2015   No results for input(s): APTT, INR, PTT in the last 168 hours. Assessment/Plan: Ms. Thomann is a 74 y.o. female with uc flare. Improving on IV solumedrol.  Recommendations: Try low residue diet and see if BM's change or not. Consider switching to po prednisone tomorrow if continues to do well. I think it will be better to wait for colonoscopy until later as outpt since pt is improving.  Either Dr. Gustavo Lah or Dr. Vira Agar will resume GI care tomorrow. thanks Please call with questions or concerns.  Araeya Lamb, Lupita Dawn, MD

## 2015-06-07 ENCOUNTER — Other Ambulatory Visit: Payer: Self-pay | Admitting: Nurse Practitioner

## 2015-06-07 DIAGNOSIS — I2510 Atherosclerotic heart disease of native coronary artery without angina pectoris: Secondary | ICD-10-CM

## 2015-06-07 DIAGNOSIS — E1110 Type 2 diabetes mellitus with ketoacidosis without coma: Secondary | ICD-10-CM

## 2015-06-07 DIAGNOSIS — R197 Diarrhea, unspecified: Secondary | ICD-10-CM

## 2015-06-07 DIAGNOSIS — K922 Gastrointestinal hemorrhage, unspecified: Secondary | ICD-10-CM

## 2015-06-07 DIAGNOSIS — K5190 Ulcerative colitis, unspecified, without complications: Secondary | ICD-10-CM

## 2015-06-07 DIAGNOSIS — D62 Acute posthemorrhagic anemia: Secondary | ICD-10-CM

## 2015-06-07 LAB — GLUCOSE, CAPILLARY
GLUCOSE-CAPILLARY: 435 mg/dL — AB (ref 65–99)
GLUCOSE-CAPILLARY: 467 mg/dL — AB (ref 65–99)
Glucose-Capillary: 279 mg/dL — ABNORMAL HIGH (ref 65–99)
Glucose-Capillary: 447 mg/dL — ABNORMAL HIGH (ref 65–99)
Glucose-Capillary: 430 mg/dL — ABNORMAL HIGH (ref 65–99)
Glucose-Capillary: 253 mg/dL — ABNORMAL HIGH (ref 65–99)

## 2015-06-07 LAB — HEMOGLOBIN
HEMOGLOBIN: 8.7 g/dL — AB (ref 12.0–16.0)
HEMOGLOBIN: 8.1 g/dL — AB (ref 12.0–16.0)

## 2015-06-07 LAB — CBC
HCT: 25.2 % — ABNORMAL LOW (ref 35.0–47.0)
HEMOGLOBIN: 8.1 g/dL — AB (ref 12.0–16.0)
MCH: 24.8 pg — AB (ref 26.0–34.0)
MCHC: 32.2 g/dL (ref 32.0–36.0)
MCV: 76.8 fL — ABNORMAL LOW (ref 80.0–100.0)
Platelets: 389 10*3/uL (ref 150–440)
RBC: 3.29 MIL/uL — ABNORMAL LOW (ref 3.80–5.20)
RDW: 19.5 % — AB (ref 11.5–14.5)
WBC: 12.9 10*3/uL — ABNORMAL HIGH (ref 3.6–11.0)

## 2015-06-07 LAB — BASIC METABOLIC PANEL
Anion gap: 4 — ABNORMAL LOW (ref 5–15)
BUN: 18 mg/dL (ref 6–20)
CALCIUM: 7.9 mg/dL — AB (ref 8.9–10.3)
CO2: 24 mmol/L (ref 22–32)
Chloride: 108 mmol/L (ref 101–111)
Creatinine, Ser: 0.87 mg/dL (ref 0.44–1.00)
GFR calc Af Amer: >60 mL/min (ref >60)
GLUCOSE: 316 mg/dL — AB (ref 65–99)
Potassium: 4.2 mmol/L (ref 3.5–5.1)
Sodium: 136 mmol/L (ref 135–145)

## 2015-06-07 MED ORDER — PREDNISONE 10 MG PO TABS: 50.0000 mg | ORAL_TABLET | Freq: Every day | ORAL | Status: DC

## 2015-06-07 MED ORDER — INSULIN ASPART 100 UNIT/ML ~~LOC~~ SOLN
0.0000 [IU] | Freq: Every day | SUBCUTANEOUS | Status: DC
Start: 2015-06-07 — End: 2015-06-10
  Administered 2015-06-07: 3 [IU] via SUBCUTANEOUS
  Administered 2015-06-09: 5 [IU] via SUBCUTANEOUS
  Filled 2015-06-07 (×2): qty 3
  Filled 2015-06-07: qty 5
  Filled 2015-06-07: qty 7
  Filled 2015-06-07: qty 3

## 2015-06-07 MED ORDER — INSULIN ASPART 100 UNIT/ML ~~LOC~~ SOLN
0.0000 [IU] | Freq: Three times a day (TID) | SUBCUTANEOUS | Status: DC
  Administered 2015-06-08: 19:00:00 3 [IU] via SUBCUTANEOUS
  Administered 2015-06-08: 7 [IU] via SUBCUTANEOUS
  Administered 2015-06-09: 17:00:00 4 [IU] via SUBCUTANEOUS
  Administered 2015-06-09: 10:00:00 3 [IU] via SUBCUTANEOUS
  Administered 2015-06-09: 12:00:00 4 [IU] via SUBCUTANEOUS
  Administered 2015-06-10: 7 [IU] via SUBCUTANEOUS
  Filled 2015-06-07 (×2): qty 4
  Filled 2015-06-07: qty 7

## 2015-06-07 MED ORDER — INSULIN GLARGINE 100 UNIT/ML ~~LOC~~ SOLN
45.0000 [IU] | Freq: Every day | SUBCUTANEOUS | Status: DC
  Administered 2015-06-08 – 2015-06-10 (×3): 45 [IU] via SUBCUTANEOUS
  Filled 2015-06-07 (×4): qty 0.45

## 2015-06-07 MED ORDER — OXYCODONE HCL 5 MG PO TABS: 5.0000 mg | ORAL_TABLET | ORAL | Status: DC | PRN

## 2015-06-07 MED ORDER — INSULIN ASPART 100 UNIT/ML ~~LOC~~ SOLN
12.0000 [IU] | Freq: Once | SUBCUTANEOUS | Status: AC
  Administered 2015-06-07: 13:00:00 12 [IU] via SUBCUTANEOUS
  Filled 2015-06-07: qty 12

## 2015-06-07 MED ORDER — TRAZODONE HCL 50 MG PO TABS: 25.0000 mg | ORAL_TABLET | Freq: Every evening | ORAL | Status: DC | PRN

## 2015-06-07 MED ORDER — GLUCERNA SHAKE PO LIQD: 237.0000 mL | Freq: Three times a day (TID) | ORAL | Status: DC

## 2015-06-07 MED ORDER — INSULIN ASPART 100 UNIT/ML ~~LOC~~ SOLN
16.0000 [IU] | Freq: Once | SUBCUTANEOUS | Status: AC
  Administered 2015-06-07: 16 [IU] via SUBCUTANEOUS
  Filled 2015-06-07: qty 16

## 2015-06-07 NOTE — Discharge Instructions (Signed)
Patient is to continue 50 mg per daily dose of prednisone unless recommended differently by gastroenterologist

## 2015-06-07 NOTE — Plan of Care (Signed)
Problem: Discharge Progression Outcomes Goal: Other Discharge Outcomes/Goals Outcome: Progressing Progress toward goals: Nutrition:  Pt having increased BMs and abd cramping after eating.  Stools continue to have some bloody discharge. Diabetes:  Pt CBGs elevated in the 400's Dr Ether Griffins noitifed and insulin dosage adjusted. Barriers to dc:  Continuing of bloody stools, abd cramping , hyperglycemia, and pain. Pain:  Oxycodone  Given once for abd pain. Minimal relief.

## 2015-06-07 NOTE — Progress Notes (Signed)
Inpatient Diabetes Program Recommendations  AACE/ADA: New Consensus Statement on Inpatient Glycemic Control (2013)  Target Ranges:  Prepandial:   less than 140 mg/dL      Peak postprandial:   less than 180 mg/dL (1-2 hours)      Critically ill patients:  140 - 180 mg/dL   Results for Bethany Russell, Bethany Russell (MRN [id number]) as of 06/07/2015 09:54  Ref. Range 06/06/2015 11:31 06/06/2015 16:07 06/06/2015 16:09 06/06/2015 21:41 06/07/2015 07:26  Glucose-Capillary Latest Ref Range: 65-99 mg/dL 360 (H) 412 (H) 379 (H) 326 (H) 279 (H)   Reason for glycemic review: elevated CBG   Diabetes history: Type 2 Outpatient Diabetes medications: Lantus 26 units qday, Humalog 8-16 units tid with meals based on a sliding scale Current orders for Inpatient glycemic control: Lantus 35 units qday, Novolog 0-9 units tid with meals   Patient is currently on steroids causing blood sugars to be elevated.    Fasting blood sugar 279mg /dl; please consider adding hs correction insulin; Novolog 0-5 units qhs.  Post prandial blood sugars elevated- consider adding Novolog 5 units tid with meals along with the correction insulin (SSI) that is already ordered.   Gentry Fitz, RN, BA, MHA, CDE Diabetes Coordinator Inpatient Diabetes Program  [phone] (Team Pager) [phone] ([place]) 06/07/2015 10:00 AM

## 2015-06-07 NOTE — Consult Note (Signed)
Pt reports less blood, still some watery stools, large blow out this am.  Overall feels better. Question of changing from oral to iv steroids and when to do home.  Will do flex sig tomorrow afternoon to better answer this question.  Discussed with patient and family.  Also concerned about blood glucose and steroids. Pt overall looks better with better color and more energy, walked in halls.  Discussed plan with Dr. Ether Griffins.

## 2015-06-08 ENCOUNTER — Encounter: Payer: Self-pay | Admitting: Certified Registered"

## 2015-06-08 ENCOUNTER — Encounter
Admission: EM | Disposition: A | Discharge status: Home / Self Care | Payer: Self-pay | Source: Home / Self Care | Attending: Internal Medicine

## 2015-06-08 ENCOUNTER — Encounter: Payer: Self-pay | Admitting: *Deleted

## 2015-06-08 HISTORY — PX: FLEXIBLE SIGMOIDOSCOPY: SHX5431

## 2015-06-08 LAB — GLUCOSE, CAPILLARY
GLUCOSE-CAPILLARY: 146 mg/dL — AB (ref 65–99)
Glucose-Capillary: 211 mg/dL — ABNORMAL HIGH (ref 65–99)
Glucose-Capillary: 144 mg/dL — ABNORMAL HIGH (ref 65–99)
Glucose-Capillary: 165 mg/dL — ABNORMAL HIGH (ref 65–99)

## 2015-06-08 LAB — HEMOGLOBIN: HEMOGLOBIN: 9.2 g/dL — AB (ref 12.0–16.0)

## 2015-06-08 SURGERY — SIGMOIDOSCOPY, FLEXIBLE
Anesthesia: Moderate Sedation

## 2015-06-08 MED ORDER — ADALIMUMAB 40 MG/0.8ML ~~LOC~~ AJKT: 40.0000 mg | AUTO-INJECTOR | SUBCUTANEOUS | Status: DC

## 2015-06-08 MED ORDER — MIDAZOLAM HCL 5 MG/5ML IJ SOLN
INTRAMUSCULAR | Status: DC | PRN
  Administered 2015-06-08: 1 mg via INTRAVENOUS

## 2015-06-08 MED ORDER — PEG 3350-KCL-NA BICARB-NACL 420 G PO SOLR
4000.0000 mL | Freq: Once | ORAL | Status: AC
  Administered 2015-06-08: 21:00:00 4000 mL via ORAL
  Filled 2015-06-08: qty 4000

## 2015-06-08 MED ORDER — MIDAZOLAM HCL 5 MG/5ML IJ SOLN
INTRAMUSCULAR | Status: AC
Start: 2015-06-08 — End: 2015-06-08
  Administered 2015-06-08: 15:00:00
  Filled 2015-06-08: qty 5

## 2015-06-08 MED ORDER — FENTANYL CITRATE (PF) 100 MCG/2ML IJ SOLN
INTRAMUSCULAR | Status: DC | PRN
  Administered 2015-06-08: 25 ug via INTRAVENOUS

## 2015-06-08 MED ORDER — FENTANYL CITRATE (PF) 100 MCG/2ML IJ SOLN
INTRAMUSCULAR | Status: AC
  Administered 2015-06-08: 15:00:00
  Filled 2015-06-08: qty 2

## 2015-06-08 NOTE — Op Note (Signed)
[facility] Gastroenterology Patient Name: [place] Procedure Date: 06/08/2015 2:15 PM MRN: [id number] Account #: [id number] Date of Birth: 1941/09/03 Admit Type: Inpatient Age: 74 Room: [facility] ENDO ROOM 3 Gender: Female Note Status: Finalized Procedure:         Flexible Sigmoidoscopy Indications:       Follow-up of chronic ulcerative pancolitis Providers:         Manya Silvas, MD Referring MD:      Christena Flake. Raechel Ache, MD (Referring MD) Medicines:         Fentanyl 25 micrograms IV, Midazolam 1 mg IV Complications:     No immediate complications. Procedure:         Pre-Anesthesia Assessment:                    - After reviewing the risks and benefits, the patient was                     deemed in satisfactory condition to undergo the procedure.                    After obtaining informed consent, the scope was passed                     under direct vision. The Olympus PCF-160AL colonoscope                     (S#. [id number]) was introduced through the anus and advanced                     to the the sigmoid colon. The flexible sigmoidoscopy was                     accomplished without difficulty. The patient tolerated the                     procedure well. The quality of the bowel preparation was                     adequate to identify polyps. Findings:      Scattered pseudopolyps were found in the rectum, in the sigmoid colon       and in the distal sigmoid colon. The mucosal lining looked good..Not       friable. Impression:        - Pseudopolyps in the rectum, in the sigmoid colon and in                     the distal sigmoid colon.                    - No specimens collected. Recommendation:    - Proceed with colonoscopy given the good appearance of                     the mucosa today.. Proceed with Humira injection Tomorrow.                     Clear liquid diet today. Manya Silvas, MD 06/08/2015 3:08:46 PM This report has been signed  electronically. Number of Addenda: 0 Note Initiated On: 06/08/2015 2:15 PM      [facility]

## 2015-06-08 NOTE — Progress Notes (Signed)
Patient Bethany Russell, alert and oriented, but does have short term memory loss. Daughters at bedside this shift, Dr. Vira Agar updated them after flex sigmoid. Patient to have colonoscopy tomorrow, will begin to drink GoLytely at [place] when pharmacy brings. [employer] covered with insuline per MAR.Per Dr. Tiffany Kocher patient may have clear liquids until 0900 on 06/09/2015 at 0900, procedure approx 1400. Patient family to bring her home Humira tomorrow for administration per Dr. Tiffany Kocher.

## 2015-06-08 NOTE — Progress Notes (Signed)
Nutrition Follow-up  DOCUMENTATION CODES:   Severe malnutrition in context of acute illness/injury  INTERVENTION:   Meals and Snacks: Cater to patient preferences on soft diet order, pt likes jello and soups Medical Food Supplement Therapy: Continue Glucerna as pt liking and drinking   NUTRITION DIAGNOSIS:   Inadequate oral intake related to acute illness, poor appetite as evidenced by per patient/family report.  GOAL:   Patient will meet greater than or equal to 90% of their needs  MONITOR:    (energy Intake, digestive System, Electrolyte and renal Profile, Anthropometrics, Skin)  REASON FOR ASSESSMENT:   Consult, Malnutrition Screening Tool Poor PO  ASSESSMENT:   Pt scheduled for flex sig 06/08/2015 secondary to increased BMs, cramping after eating and stools tinged with blood.   Diet Order:  DIET SOFT Room service appropriate?: Yes; Fluid consistency:: Thin Diet - low sodium heart healthy    Current Nutrition: Pt did not eat breakfast as she reports being scheduled for procedure today. Pt daughter reports appetite is much improved secondary to steroid. Recorded po intake 66% of meals on average. Pt reports drinking Glucerna likes chocolate flavor.    Gastrointestinal Profile: Last BM: 06/08/2015   Medications: Novolog, Ferrous sulfate, imodium, lantus, solumedrol, protonix, NS at 118mL/hr  Electrolyte/Renal Profile and Glucose Profile:   Recent Labs Lab 06/02/15 0618 06/04/15 0539 06/07/15 0444  NA 139 137 136  K 3.4* 4.4 4.2  CL 111 111 108  CO2 22 21* 24  BUN 8 10 18   CREATININE 1.12* 1.07* 0.87  CALCIUM 7.7* 7.7* 7.9*  GLUCOSE 109* 282* 316*   Protein Profile: No results for input(s): ALBUMIN in the last 168 hours.   Weight Trend since Admission: Filed Weights   05/31/15 2011 06/04/15 1300 06/08/15 0511  Weight: 130 lb 9.6 oz (59.24 kg) 139 lb 4.8 oz (63.186 kg) 127 lb (57.607 kg)    Skin:   (Stage II pressure ulcer on heel)   BMI:  Body  mass index is 21.79 kg/(m^2).  Estimated Nutritional Needs:   Kcal:  1408-1665kcals, BEE: 1067kcals, TEE: (IF 1.1-1.3)(AF 1.2)  Protein:  60-71g protein (1.0-1.2g/kg)  Fluid:  1480-1779mL of fluid (25-47mL/kg)  EDUCATION NEEDS:   No education needs identified at this time    [place], RD, LDN Pager [phone]

## 2015-06-08 NOTE — Progress Notes (Signed)
[facility] [place] NAME: Bethany Russell    MR#:  [id number]  DATE OF BIRTH:  09/30/1941  SUBJECTIVE:  CHIEF COMPLAINT:   Chief Complaint  Patient presents with  . Fever   -Transferred to intensive care unit due to frequent rectal bleeding,  Hypotension, abdominal pain, watery diarrhea . Feels better- Hb stable.  REVIEW OF SYSTEMS:  Review of Systems  Constitutional: Negative for fever and chills.  HENT: Negative for ear discharge and tinnitus.   Eyes: Negative for blurred vision.  Respiratory: Negative for cough, shortness of breath and wheezing.   Cardiovascular: Negative for chest pain and palpitations.  Gastrointestinal: Positive for nausea and abdominal pain. Negative for vomiting, diarrhea and constipation.  Genitourinary: Negative for dysuria.  Neurological: Positive for weakness. Negative for dizziness, sensory change, speech change, focal weakness, seizures and headaches.    DRUG ALLERGIES:  No Known Allergies  VITALS:  Blood pressure 148/65, pulse 71, temperature 97.7 F (36.5 C), temperature source Tympanic, resp. rate 21, height 5\' 4"  (1.626 m), weight 57.607 kg (127 lb), SpO2 98 %.  PHYSICAL EXAMINATION:  Physical Exam  GENERAL:  74 y.o.-year-old patient lying in the bed with no acute distress. Sitting on the bedside commode, comfortable EYES: Pupils equal, round, reactive to light and accommodation. No scleral icterus. Extraocular muscles intact.  HEENT: Head atraumatic, normocephalic. Oropharynx and nasopharynx clear. Dry oral mucosa  NECK:  Supple, no jugular venous distention. No thyroid enlargement, no tenderness.  LUNGS: Normal breath sounds bilaterally, no wheezing, rales,rhonchi or crepitation. No use of accessory muscles of respiration.  CARDIOVASCULAR: S1, S2 normal. No murmurs, rubs, or gallops.  ABDOMEN: Soft, nontender, nondistended. Bowel sounds present, diminished  No organomegaly or mass.   EXTREMITIES: No pedal edema, cyanosis, or clubbing.  NEUROLOGIC: Cranial nerves II through XII are intact. Muscle strength 4/5 in all extremities. Sensation intact. Gait not checked.  PSYCHIATRIC: The patientis alert  SKIN: No obvious rash, lesion, or ulcer.    LABORATORY PANEL:   CBC  Recent Labs Lab 06/07/15 0444  06/08/15 0512  WBC 12.9*  --   --   HGB 8.1*  < > 9.2*  HCT 25.2*  --   --   PLT 389  --   --   < > = values in this interval not displayed. ------------------------------------------------------------------------------------------------------------------  Chemistries   Recent Labs Lab 06/07/15 0444  NA 136  K 4.2  CL 108  CO2 24  GLUCOSE 316*  BUN 18  CREATININE 0.87  CALCIUM 7.9*   ------------------------------------------------------------------------------------------------------------------  Cardiac Enzymes No results for input(s): TROPONINI in the last 168 hours. ------------------------------------------------------------------------------------------------------------------  RADIOLOGY:  No results found.   ASSESSMENT AND PLAN:   74 year old female with known history of ulcerative colitis, hypertension diabetes presents to the hospital secondary to resistant Escherichia coli infection.  1. Acute posthemorrhagic anemia. Transfused  1 unit packed red blood cells  with improvement of  hemoglobin level ,   Following hemoglobin level daily, it is stable.   2 Acute gastrointestinal bleed, likely due to ulcerative colitis exacerbation,  rule out diverticular bleed , continue patient on steroids IV, gastroenterologist consultation is appreciated. Gastroenterologist  Did sigmoidoscopy today 06/08/15- no source of bleed- plan for colonoscopy tomorrow. 3 Diarrhea, stool cultures for C. Difficile are negative, 4 urinary tract infection  Due to Escherichia coli-failed outpatient Macrobid. Escherichia coli was resistant to Cipro that she was on prior to  that.-Blood cultures are negative. However urine cultures are positive  for 6000 colony-forming units. Of Rocephin due to insignificant growth  5 . Ulcerative colitis exacerbation- abdominal pain, bloody diarrhea -Now off Flagyl, was on prednisone chronically for immunosuppression, now on steroids IV,  Improving clinically - continue mesalamine, steroids IV. -Also on Humira every 2 weeks.  GI consulted,  Appreciated input,   6 Diabetes mellitus, advanced  Lantus ,  continue sliding scale insulin. Blood glucose levels are poorly controlled due to steroids.   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 Currently stable. No active cardiac symptoms.  9 DVT prophylaxis not on subcutaneous heparin due to gastrointestinal bleed, now on SCDs  All the records are reviewed and case discussed with Care Management/Social Workerr. Management plans discussed with the patient, family and they are in agreement.  CODE STATUS:Full Code  TOTAL TIME TAKING CARE OF THIS PATIENT: 48minutes  . Patient's daughter is updated at the bedsite    Vaughan Basta M.D on 06/08/2015 at 8:36 PM  Between 7am to 6pm - Pager - [phone]  After 6pm go to [email or web address] - password [email or web address]  Office  [phone]  CC: Primary care physician; Ezequiel Kayser, MD

## 2015-06-08 NOTE — Consult Note (Signed)
Will start Humira back tomorrow when daughter brings medicine from home. Plan to do colonoscopy tomorrow with golytely prep tonight.  Discussed with daughter and patient.

## 2015-06-08 NOTE — Plan of Care (Signed)
Problem: Discharge Progression Outcomes Goal: Other Discharge Outcomes/Goals Outcome: Progressing Plan of care progress to goal: VSS Pt complains of no pain Pt scheduled for flexible sigmoidoscopy 9/7

## 2015-06-08 NOTE — Consult Note (Signed)
  Flex sig looked good, will try to do complete colonoscopy tomorrow.  Will discuss with daughter.

## 2015-06-09 ENCOUNTER — Inpatient Hospital Stay: Payer: Medicare Other | Admitting: Anesthesiology

## 2015-06-09 ENCOUNTER — Encounter
Admission: EM | Disposition: A | Discharge status: Home / Self Care | Payer: Self-pay | Source: Home / Self Care | Attending: Internal Medicine

## 2015-06-09 ENCOUNTER — Encounter: Payer: Self-pay | Admitting: *Deleted

## 2015-06-09 HISTORY — PX: COLONOSCOPY: SHX5424

## 2015-06-09 LAB — GLUCOSE, CAPILLARY
GLUCOSE-CAPILLARY: 160 mg/dL — AB (ref 65–99)
GLUCOSE-CAPILLARY: 194 mg/dL — AB (ref 65–99)
Glucose-Capillary: 131 mg/dL — ABNORMAL HIGH (ref 65–99)
Glucose-Capillary: 397 mg/dL — ABNORMAL HIGH (ref 65–99)

## 2015-06-09 SURGERY — COLONOSCOPY
Anesthesia: General

## 2015-06-09 MED ORDER — PROPOFOL INFUSION 10 MG/ML OPTIME
INTRAVENOUS | Status: DC | PRN
  Administered 2015-06-09: 100 ug/kg/min via INTRAVENOUS

## 2015-06-09 MED ORDER — SODIUM CHLORIDE 0.9 % IJ SOLN
3.0000 mL | Freq: Two times a day (BID) | INTRAMUSCULAR | Status: DC
  Administered 2015-06-09 – 2015-06-10 (×2): 3 mL via INTRAVENOUS

## 2015-06-09 MED ORDER — EPHEDRINE SULFATE 50 MG/ML IJ SOLN
INTRAMUSCULAR | Status: DC | PRN
  Administered 2015-06-09: 5 mg via INTRAVENOUS

## 2015-06-09 MED ORDER — PREDNISONE 20 MG PO TABS
20.0000 mg | ORAL_TABLET | Freq: Two times a day (BID) | ORAL | Status: DC
  Administered 2015-06-09 – 2015-06-10 (×2): 20 mg via ORAL
  Filled 2015-06-09 (×2): qty 1

## 2015-06-09 NOTE — Transfer of Care (Signed)
Immediate Anesthesia Transfer of Care Note  Patient: Bethany Russell  Procedure(s) Performed: Procedure(s): COLONOSCOPY (N/A)  Patient Location: PACU  Anesthesia Type:General  Level of Consciousness: awake, alert  and sedated  Airway & Oxygen Therapy: Patient Spontanous Breathing and Patient connected to nasal cannula oxygen  Post-op Assessment: Report given to RN and Post -op Vital signs reviewed and stable  Post vital signs: Reviewed and stable  Last Vitals:  Filed Vitals:   06/09/15 1317  BP: 132/68  Pulse: 82  Temp: 37.3 C  Resp:     Complications: No apparent anesthesia complications

## 2015-06-09 NOTE — Plan of Care (Signed)
Problem: Discharge Progression Outcomes Goal: Other Discharge Outcomes/Goals Progress toward Goals:  1.  Pain:  Patient without complaints of pain this shift.  Patient did complain of anxiety about colonoscopy prep.  Given Xanax per eMAR.  Anxiety resolved upon reassessment. 2.  Hemodynamically:  Patient afebrile and VSS this shift.  No further rectal bleeding this shift. BP 143/56 mmHg  Pulse 77  Temp(Src) 98.2 F (36.8 C) (Oral)  Resp 18  Ht 5\' 4"  (1.626 m)  Wt 127 lb (57.607 kg)  BMI 21.79 kg/m2  SpO2 100% 3.  Complications:  Patient began Golytely prep at 21:00.  She was able to complete prep at 06:00.  Planned complete colonoscopy today 06/09/15 at 14:00.  Per Dr. Vira Agar notes, patient on clear liquid diet until 09:00 and then NPO until after colonoscopy.  Patient also to be given Humira today when it is brought in by daughter. 4.  Diet:  Patient did complain of hunger this shift.  She wanted more substantial food than clear liquids.  Family at bedside helped to reinforce need for clear liquids - patient accepting. 5.  Activity:  Patient up to bathroom throughout shift.  She is high fall risk, however, family is at bedside at all times and she does not get up without requesting assistance.  She is unsteady on feet and needs 1 person assist.  She tolerated well.

## 2015-06-09 NOTE — Anesthesia Preprocedure Evaluation (Signed)
Anesthesia Evaluation  Patient identified by MRN, date of birth, ID band Patient awake    Reviewed: Allergy & Precautions, H&P , NPO status , Patient's Chart, lab work & pertinent test results, reviewed documented beta blocker date and time   Airway Mallampati: II  TM Distance: >3 FB Neck ROM: full    Dental no notable dental hx. (+) Teeth Intact   Pulmonary neg pulmonary ROS,    breath sounds clear to auscultation       Cardiovascular Exercise Tolerance: Good hypertension, + CAD and + CABG  negative cardio ROS   Rhythm:regular Rate:Normal     Neuro/Psych negative neurological ROS  negative psych ROS   GI/Hepatic negative GI ROS, Neg liver ROS, PUD,   Endo/Other  negative endocrine ROSdiabetes  Renal/GU negative Renal ROS  negative genitourinary   Musculoskeletal   Abdominal   Peds  Hematology negative hematology ROS (+) anemia ,   Anesthesia Other Findings   Reproductive/Obstetrics negative OB ROS                             Anesthesia Physical Anesthesia Plan  ASA: III  Anesthesia Plan: General   Post-op Pain Management:    Induction:   Airway Management Planned:   Additional Equipment:   Intra-op Plan:   Post-operative Plan:   Informed Consent: I have reviewed the patients History and Physical, chart, labs and discussed the procedure including the risks, benefits and alternatives for the proposed anesthesia with the patient or authorized representative who has indicated his/her understanding and acceptance.   Dental Advisory Given  Plan Discussed with: CRNA  Anesthesia Plan Comments:         Anesthesia Quick Evaluation

## 2015-06-09 NOTE — Plan of Care (Signed)
Problem: Discharge Progression Outcomes Goal: Other Discharge Outcomes/Goals Outcome: Progressing Patient c/o ABD pain x2 relieved well with  prn Oxycodone  Patient also c/o anxiety prior colonoscopy relieved well with prn Xanax  Family at bedside  Patient is a one person assist to [facility]

## 2015-06-09 NOTE — Care Management Important Message (Signed)
Important Message  Patient Details  Name: Bethany Russell MRN: [id number] Date of Birth: 17-Feb-1941   Medicare Important Message Given:  Yes-fourth notification given    Darius Bump Allmond 06/09/2015, 9:07 AM

## 2015-06-09 NOTE — Progress Notes (Signed)
[facility] [place] NAME: Bethany Russell    MR#:  [id number]  DATE OF BIRTH:  [birth year]  SUBJECTIVE:  CHIEF COMPLAINT:   Chief Complaint  Patient presents with  . Fever   -Transferred to intensive care unit due to frequent rectal bleeding,  Hypotension, abdominal pain, watery diarrhea . Feels better- Hb stable. S/p sigmoidoscopy and today colonoscopy. REVIEW OF SYSTEMS:  Review of Systems  Constitutional: Negative for fever and chills.  HENT: Negative for ear discharge and tinnitus.   Eyes: Negative for blurred vision.  Respiratory: Negative for cough, shortness of breath and wheezing.   Cardiovascular: Negative for chest pain and palpitations.  Gastrointestinal: Negative for nausea, vomiting, abdominal pain, diarrhea and constipation.  Genitourinary: Negative for dysuria.  Neurological: Positive for weakness. Negative for dizziness, sensory change, speech change, focal weakness, seizures and headaches.    DRUG ALLERGIES:  No Known Allergies  VITALS:  Blood pressure 126/63, pulse 92, temperature 97.8 F (36.6 C), temperature source Oral, resp. rate 22, height 5\' 4"  (1.626 m), weight 57.607 kg (127 lb), SpO2 98 %.  PHYSICAL EXAMINATION:  Physical Exam  GENERAL:  74 y.o.-year-old patient lying in the bed with no acute distress. Sitting on the bedside commode, comfortable EYES: Pupils equal, round, reactive to light and accommodation. No scleral icterus. Extraocular muscles intact.  HEENT: Head atraumatic, normocephalic. Oropharynx and nasopharynx clear. Dry oral mucosa  NECK:  Supple, no jugular venous distention. No thyroid enlargement, no tenderness.  LUNGS: Normal breath sounds bilaterally, no wheezing, rales,rhonchi or crepitation. No use of accessory muscles of respiration.  CARDIOVASCULAR: S1, S2 normal. No murmurs, rubs, or gallops.  ABDOMEN: Soft, nontender, nondistended. Bowel sounds present, diminished  No  organomegaly or mass.  EXTREMITIES: No pedal edema, cyanosis, or clubbing.  NEUROLOGIC: Cranial nerves II through XII are intact. Muscle strength 4/5 in all extremities. Sensation intact. Gait not checked.  PSYCHIATRIC: The patientis alert  SKIN: No obvious rash, lesion, or ulcer.    LABORATORY PANEL:   CBC  Recent Labs Lab 06/07/15 0444  06/08/15 0512  WBC 12.9*  --   --   HGB 8.1*  < > 9.2*  HCT 25.2*  --   --   PLT 389  --   --   < > = values in this interval not displayed. ------------------------------------------------------------------------------------------------------------------  Chemistries   Recent Labs Lab 06/07/15 0444  NA 136  K 4.2  CL 108  CO2 24  GLUCOSE 316*  BUN 18  CREATININE 0.87  CALCIUM 7.9*   ------------------------------------------------------------------------------------------------------------------  Cardiac Enzymes No results for input(s): TROPONINI in the last 168 hours. ------------------------------------------------------------------------------------------------------------------  RADIOLOGY:  No results found.   ASSESSMENT AND PLAN:   74 year old female with known history of ulcerative colitis, hypertension diabetes presents to the hospital secondary to resistant Escherichia coli infection.  1. Acute posthemorrhagic anemia. Transfused  1 unit packed red blood cells  with improvement of  hemoglobin level ,   Followed hemoglobin level is stable.   2 Acute gastrointestinal bleed, likely due to ulcerative colitis exacerbation,  rule out diverticular bleed , continue patient on steroids IV, gastroenterologist consultation is appreciated. Gastroenterologist  Did sigmoidoscopy today 06/08/15- no source of bleed- plan for colonoscopy - done- showed one polyp and many pseudopolyp.  3 Diarrhea, stool cultures for C. Difficile are negative, 4 urinary tract infection  Due to Escherichia coli-failed outpatient Macrobid. Escherichia coli  was resistant to Cipro that she was on prior to that.-Blood cultures  are negative. However urine cultures are positive for 6000 colony-forming units. Of Rocephin due to insignificant growth  5 . Ulcerative colitis exacerbation- abdominal pain, bloody diarrhea -Now off Flagyl, was on prednisone chronically for immunosuppression, now on steroids IV,  Improving clinically - continue mesalamine, steroids IV. -Also on Humira every 2 weeks. Given dose today. - appreciated GI help.  6 Diabetes mellitus, advanced  Lantus ,  continue sliding scale insulin. Blood glucose levels are poorly controlled due to steroids.   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 Currently stable. No active cardiac symptoms.  9 DVT prophylaxis not on subcutaneous heparin due to gastrointestinal bleed, now on SCDs  All the records are reviewed and case discussed with Care Management/Social Worker. Management plans discussed with the patient, family and they are in agreement.  CODE STATUS:Full Code  TOTAL TIME TAKING CARE OF THIS PATIENT: 61minutes  . Patient's daughter is updated at the bedsite    Vaughan Basta M.D on 06/09/2015 at 8:40 PM  Between 7am to 6pm - Pager - [phone]  After 6pm go to [email or web address] - password [email or web address] [facility]  Office  [phone]  CC: Primary care physician; Ezequiel Kayser, MD

## 2015-06-09 NOTE — Op Note (Signed)
[facility] Gastroenterology Patient Name: [place] Procedure Date: 06/09/2015 1:17 PM MRN: [id number] Account #: [id number] Date of Birth: March 13, 1941 Admit Type: Inpatient Age: 74 Room: [facility] ENDO ROOM 1 Gender: Female Note Status: Finalized Procedure:         Colonoscopy Indications:       Disease activity assessment of chronic ulcerative                     pancolitis, Assess therapeutic response to therapy of                     chronic ulcerative pancolitis Providers:         Manya Silvas, MD Referring MD:      No Local Md, MD (Referring MD) Medicines:         Propofol per Anesthesia Complications:     No immediate complications. Procedure:         Pre-Anesthesia Assessment:                    - After reviewing the risks and benefits, the patient was                     deemed in satisfactory condition to undergo the procedure.                    After obtaining informed consent, the colonoscope was                     passed under direct vision. Throughout the procedure, the                     patient's blood pressure, pulse, and oxygen saturations                     were monitored continuously. The Colonoscope was                     introduced through the anus and advanced to the the cecum,                     identified by appendiceal orifice and ileocecal valve. The                     colonoscopy was performed without difficulty. The patient                     tolerated the procedure well. The quality of the bowel                     preparation was good. Findings:      A 12 mm polyp was found in the proximal ascending colon. The polyp was       sessile. The polyp was removed with a hot snare. Resection and retrieval       were complete. To prevent bleeding after the polypectomy, one hemostatic       clip was successfully placed (MRI compatible). There was no bleeding       during, and at the end, of the procedure.      Diffuse pseudopolyps  were found in the rectum, in the sigmoid colon, in       the descending colon, in the transverse colon and in the ascending colon.      The mucosa looked better than expected  with vascular detail intact in       most places. Impression:        - One 12 mm polyp in the proximal ascending colon.                     Resected and retrieved. Clip was placed.                    - Pseudopolyps in the rectum, in the sigmoid colon, in the                     descending colon, in the transverse colon and in the                     ascending colon. Recommendation:    - Await pathology results.                    - The findings and recommendations were discussed with the                     patient's family. Advance diet, taper steroids slowly.                     Home soon. Follow up in office. Manya Silvas, MD 06/09/2015 2:07:52 PM This report has been signed electronically. Number of Addenda: 0 Note Initiated On: 06/09/2015 1:17 PM Scope Withdrawal Time: 0 hours 11 minutes 10 seconds  Total Procedure Duration: 0 hours 23 minutes 21 seconds       [employer]

## 2015-06-09 NOTE — Anesthesia Procedure Notes (Signed)
Performed by: COOK-MARTIN, Kamonte Mcmichen Pre-anesthesia Checklist: Patient identified, Emergency Drugs available, Suction available, Patient being monitored and Timeout performed Patient Re-evaluated:Patient Re-evaluated prior to inductionOxygen Delivery Method: Nasal cannula Preoxygenation: Pre-oxygenation with 100% oxygen Intubation Type: IV induction Placement Confirmation: positive ETCO2 and CO2 detector       

## 2015-06-10 ENCOUNTER — Encounter: Payer: Self-pay | Admitting: Unknown Physician Specialty

## 2015-06-10 LAB — CBC
HEMATOCRIT: 28.7 % — AB (ref 35.0–47.0)
HEMOGLOBIN: 8.9 g/dL — AB (ref 12.0–16.0)
MCH: 24.5 pg — ABNORMAL LOW (ref 26.0–34.0)
MCHC: 31.1 g/dL — ABNORMAL LOW (ref 32.0–36.0)
MCV: 79 fL — ABNORMAL LOW (ref 80.0–100.0)
Platelets: 375 10*3/uL (ref 150–440)
RBC: 3.63 MIL/uL — AB (ref 3.80–5.20)
RDW: 21.3 % — ABNORMAL HIGH (ref 11.5–14.5)
WBC: 13.1 10*3/uL — AB (ref 3.6–11.0)

## 2015-06-10 LAB — BASIC METABOLIC PANEL
ANION GAP: 7 (ref 5–15)
BUN: 16 mg/dL (ref 6–20)
CHLORIDE: 104 mmol/L (ref 101–111)
CO2: 25 mmol/L (ref 22–32)
CREATININE: 1.03 mg/dL — AB (ref 0.44–1.00)
Calcium: 8.6 mg/dL — ABNORMAL LOW (ref 8.9–10.3)
GFR calc non Af Amer: 51 mL/min — ABNORMAL LOW (ref >60)
GFR, EST AFRICAN AMERICAN: 60 mL/min — AB (ref >60)
Glucose, Bld: 260 mg/dL — ABNORMAL HIGH (ref 65–99)
POTASSIUM: 3.7 mmol/L (ref 3.5–5.1)
SODIUM: 136 mmol/L (ref 135–145)

## 2015-06-10 LAB — GLUCOSE, CAPILLARY: GLUCOSE-CAPILLARY: 220 mg/dL — AB (ref 65–99)

## 2015-06-10 MED ORDER — PREDNISONE 20 MG PO TABS: 20.0000 mg | ORAL_TABLET | Freq: Two times a day (BID) | ORAL | Status: DC

## 2015-06-10 NOTE — Anesthesia Postprocedure Evaluation (Signed)
  Anesthesia Post-op Note  Patient: Bethany Russell  Procedure(s) Performed: Procedure(s): COLONOSCOPY (N/A)  Anesthesia type:General  Patient location: PACU  Post pain: Pain level controlled  Post assessment: Post-op Vital signs reviewed, Patient's Cardiovascular Status Stable, Respiratory Function Stable, Patent Airway and No signs of Nausea or vomiting  Post vital signs: Reviewed and stable  Last Vitals:  Filed Vitals:   06/10/15 0740  BP: 130/70  Pulse: 94  Temp: 36.7 C  Resp: 19    Level of consciousness: awake, alert  and patient cooperative  Complications: No apparent anesthesia complications

## 2015-06-10 NOTE — Plan of Care (Signed)
Problem: Discharge Progression Outcomes Goal: Other Discharge Outcomes/Goals Outcome: Progressing Pt with no complaints of pain or discomfort. PRN xanax given times 1 for anxiety. Relief noted. Pt tolerating diet with no complications. VSS. Possible discharge 06/10/15.

## 2015-06-10 NOTE — Progress Notes (Signed)
Family refuses bed alarm, educated

## 2015-06-10 NOTE — Discharge Summary (Signed)
[facility] NAME: Bethany Russell    MR#:  [id number]  DATE OF BIRTH:  07-24-1941  DATE OF ADMISSION:  05/31/2015 ADMITTING PHYSICIAN: Vaughan Basta, MD  DATE OF DISCHARGE:06/10/2015  PRIMARY CARE PHYSICIAN: THIES, DAVID, MD    ADMISSION DIAGNOSIS:  Colitis [K52.9] UTI (lower urinary tract infection) [N39.0] Sepsis, due to unspecified organism [A41.9]  DISCHARGE DIAGNOSIS:  Principal Problem:   Sepsis Active Problems:   UTI (lower urinary tract infection)   Pressure ulcer   Protein-calorie malnutrition, severe   Coronary artery disease   Acute posthemorrhagic anemia   Exacerbation of ulcerative colitis   Gastrointestinal bleed   Diarrhea   DM (diabetes mellitus) type 2, uncontrolled, with ketoacidosis   Ulcerative colitis flare.  SECONDARY DIAGNOSIS:   Past Medical History  Diagnosis Date  . Ulcerative colitis   . Hypertension   . Diabetes mellitus without complication   . Cancer     skin cancer  . Coronary artery disease     HOSPITAL COURSE:   74 year old female with known history of ulcerative colitis, hypertension diabetes presents to the hospital secondary to resistant Escherichia coli infection.  1. Acute posthemorrhagic anemia. Transfused 1 unit packed red blood cells with improvement of hemoglobin level , Followed hemoglobin level is stable.   2 Acute gastrointestinal bleed, likely due to ulcerative colitis exacerbation, rule out diverticular bleed , continue patient on steroids IV, gastroenterologist consultation is appreciated. Gastroenterologist Did sigmoidoscopy today 06/08/15- no source of bleed- plan for colonoscopy - done- showed one polyp and many pseudopolyp.  3 Diarrhea, stool cultures for C. Difficile are negative, 4 urinary tract infection Due to Escherichia coli-failed outpatient Macrobid. Escherichia coli was resistant to Cipro that she was on prior to that.-Blood cultures  are negative. However urine cultures are positive for 6000 colony-forming units. Of Rocephin due to insignificant growth  5 . Ulcerative colitis exacerbation- abdominal pain, bloody diarrhea -Now off Flagyl, was on prednisone chronically for immunosuppression, now on steroids IV, Improving clinically - continue mesalamine, steroids IV. -Also on Humira every 2 weeks. Given dose today. - appreciated GI help.  6 Diabetes mellitus, advanced Lantus , continue sliding scale insulin. Blood glucose levels are poorly controlled due to steroids.   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 Currently stable. No active cardiac symptoms.  9 DVT prophylaxis not on subcutaneous heparin due to gastrointestinal bleed, now on SCDs  DISCHARGE CONDITIONS:   Stable.  CONSULTS OBTAINED:  Treatment Team:  Lollie Sails, MD Vaughan Basta, MD  DRUG ALLERGIES:  No Known Allergies  DISCHARGE MEDICATIONS:   Current Discharge Medication List    START taking these medications   Details  feeding supplement, GLUCERNA SHAKE, (GLUCERNA SHAKE) LIQD Take 237 mLs by mouth 3 (three) times daily with meals. Qty: 60 Can, Refills: 4    oxyCODONE (OXY IR/ROXICODONE) 5 MG immediate release tablet Take 1 tablet (5 mg total) by mouth every 4 (four) hours as needed for moderate pain. Qty: 30 tablet, Refills: 0    !! predniSONE (DELTASONE) 10 MG tablet Take 5 tablets (50 mg total) by mouth daily with breakfast. Qty: 60 tablet, Refills: 3    !! predniSONE (DELTASONE) 20 MG tablet Take 1 tablet (20 mg total) by mouth 2 (two) times daily with a meal. Qty: 14 tablet, Refills: 0    traZODone (DESYREL) 50 MG tablet Take 0.5 tablets (25 mg total) by mouth at bedtime as needed for  sleep. Qty: 30 tablet, Refills: 4     !! - Potential duplicate medications found. Please discuss with provider.    CONTINUE these medications which have NOT CHANGED   Details   acetaminophen-codeine (TYLENOL #3) 300-30 MG per tablet Take 1 tablet by mouth 3 (three) times daily.     alendronate (FOSAMAX) 70 MG tablet Take 70 mg by mouth once a week. Pt takes on Sunday.    diclofenac sodium (VOLTAREN) 1 % GEL Apply 2 g topically 4 (four) times daily as needed (for pain).     ferrous sulfate 325 (65 FE) MG tablet Take 325 mg by mouth at bedtime.     HUMIRA PEN 40 MG/0.8ML PNKT Inject 40 mg into the skin every 14 (fourteen) days. Pt uses on Tuesday.    insulin glargine (LANTUS) 100 UNIT/ML injection Inject 26 Units into the skin daily.    insulin lispro (HUMALOG) 100 UNIT/ML injection Inject 8-16 Units into the skin 3 (three) times daily as needed for high blood sugar. Pt uses per sliding scale at every meal.    levothyroxine (SYNTHROID, LEVOTHROID) 25 MCG tablet Take 25 mcg by mouth daily.     loperamide (IMODIUM) 2 MG capsule Take 2 mg by mouth 2 (two) times daily.     Melatonin 3 MG TABS Take 3 mg by mouth at bedtime.     Mesalamine (ASACOL HD) 800 MG TBEC Take 800 mg by mouth 3 (three) times daily.     metoprolol succinate (TOPROL-XL) 25 MG 24 hr tablet Take 25 mg by mouth daily.    pantoprazole (PROTONIX) 40 MG tablet Take 40 mg by mouth daily.     pravastatin (PRAVACHOL) 80 MG tablet Take 80 mg by mouth at bedtime.     !! predniSONE (DELTASONE) 10 MG tablet Take 10 mg by mouth every other day.    sertraline (ZOLOFT) 50 MG tablet Take 50 mg by mouth daily.     Vitamin D, Ergocalciferol, (DRISDOL) 50000 UNITS CAPS capsule Take 50,000 Units by mouth once a week. Pt takes on Sunday.     !! - Potential duplicate medications found. Please discuss with provider.    STOP taking these medications     aspirin 81 MG chewable tablet      clopidogrel (PLAVIX) 75 MG tablet      metroNIDAZOLE (FLAGYL) 500 MG tablet      nitrofurantoin, macrocrystal-monohydrate, (MACROBID) 100 MG capsule      ciprofloxacin (CIPRO) 500 MG tablet          DISCHARGE  INSTRUCTIONS:    Follow with GI clinic in 2 weeks.  If you experience worsening of your admission symptoms, develop shortness of breath, life threatening emergency, suicidal or homicidal thoughts you must seek medical attention immediately by calling 911 or calling your MD immediately  if symptoms less severe.  You Must read complete instructions/literature along with all the possible adverse reactions/side effects for all the Medicines you take and that have been prescribed to you. Take any new Medicines after you have completely understood and accept all the possible adverse reactions/side effects.   Please note  You were cared for by a hospitalist during your hospital stay. If you have any questions about your discharge medications or the care you received while you were in the hospital after you are discharged, you can call the unit and asked to speak with the hospitalist on call if the hospitalist that took care of you is not available. Once you are discharged, your  primary care physician will handle any further medical issues. Please note that NO REFILLS for any discharge medications will be authorized once you are discharged, as it is imperative that you return to your primary care physician (or establish a relationship with a primary care physician if you do not have one) for your aftercare needs so that they can reassess your need for medications and monitor your lab values.    Today   CHIEF COMPLAINT:   Chief Complaint  Patient presents with  . Fever    HISTORY OF PRESENT ILLNESS:  Bethany Russell  is a 74 y.o. female with a known history of ulcerative colitis, hypertension, diabetes without complication, skin cancer, coronary artery disease- had fever and symptoms of UTI so came to emergency room 5 days ago and after tested positive urinalysis she was sent home with oral ciprofloxacin prescriptions. She will follow with Dr. Vira Agar for ulcerative colitis after 2 days in office, he  check urine culture report and he notices that her urine is growing Escherichia coli which is resistant to Cipro so he changed to [place]. She just Macrobid last night and today morning but overall her condition is getting worse, she is more weakness dizziness and nausea today so daughters decided to bring her back to emergency room after talking to Dr. Vira Agar on phone. She is noted to have tachycardia, fever and elevated white cell count   VITAL SIGNS:  Blood pressure 130/70, pulse 94, temperature 98.1 F (36.7 C), temperature source Oral, resp. rate 19, height 5\' 4"  (1.626 m), weight 57.607 kg (127 lb), SpO2 99 %.  I/O:   Intake/Output Summary (Last 24 hours) at 06/10/15 1010 Last data filed at 06/10/15 0900  Gross per 24 hour  Intake    440 ml  Output      0 ml  Net    440 ml    PHYSICAL EXAMINATION:   GENERAL: 74 y.o.-year-old patient lying in the bed with no acute distress. Sitting on the bedside commode, comfortable EYES: Pupils equal, round, reactive to light and accommodation. No scleral icterus. Extraocular muscles intact.  HEENT: Head atraumatic, normocephalic. Oropharynx and nasopharynx clear. Dry oral mucosa  NECK: Supple, no jugular venous distention. No thyroid enlargement, no tenderness.  LUNGS: Normal breath sounds bilaterally, no wheezing, rales,rhonchi or crepitation. No use of accessory muscles of respiration.  CARDIOVASCULAR: S1, S2 normal. No murmurs, rubs, or gallops.  ABDOMEN: Soft, nontender, nondistended. Bowel sounds present, diminished No organomegaly or mass.  EXTREMITIES: No pedal edema, cyanosis, or clubbing.  NEUROLOGIC: Cranial nerves II through XII are intact. Muscle strength 4/5 in all extremities. Sensation intact. Gait not checked.  PSYCHIATRIC: The patientis alert  SKIN: No obvious rash, lesion, or ulcer.    DATA REVIEW:   CBC  Recent Labs Lab 06/10/15 0542  WBC 13.1*  HGB 8.9*  HCT 28.7*  PLT 375    Chemistries    Recent Labs Lab 06/10/15 0542  NA 136  K 3.7  CL 104  CO2 25  GLUCOSE 260*  BUN 16  CREATININE 1.03*  CALCIUM 8.6*    Cardiac Enzymes No results for input(s): TROPONINI in the last 168 hours.  Microbiology Results  Results for orders placed or performed during the hospital encounter of 05/31/15  Blood Culture (routine x 2)     Status: None   Collection Time: 05/31/15  3:44 PM  Result Value Ref Range Status   Specimen Description BLOOD RIGHT ARM  Final   Special Requests BOTTLES DRAWN  AEROBIC AND ANAEROBIC 6CC  Final   Culture NO GROWTH 5 DAYS  Final   Report Status 06/05/2015 FINAL  Final  Blood Culture (routine x 2)     Status: None   Collection Time: 05/31/15  3:45 PM  Result Value Ref Range Status   Specimen Description BLOOD RIGHT ARM  Final   Special Requests BOTTLES DRAWN AEROBIC AND ANAEROBIC 3CC  Final   Culture NO GROWTH 5 DAYS  Final   Report Status 06/05/2015 FINAL  Final  Urine culture     Status: None   Collection Time: 05/31/15  8:40 PM  Result Value Ref Range Status   Specimen Description URINE, RANDOM  Final   Special Requests NONE  Final   Culture 6,000 COLONIES/mL INSIGNIFICANT GROWTH  Final   Report Status 06/02/2015 FINAL  Final  C difficile quick scan w PCR reflex     Status: None   Collection Time: 06/02/15  7:56 PM  Result Value Ref Range Status   C Diff antigen NEGATIVE NEGATIVE Final   C Diff toxin NEGATIVE NEGATIVE Final   C Diff interpretation Negative for C. difficile  Final  MRSA PCR Screening     Status: None   Collection Time: 06/03/15  6:45 AM  Result Value Ref Range Status   MRSA by PCR NEGATIVE NEGATIVE Final    Comment:        The GeneXpert MRSA Assay (FDA approved for NASAL specimens only), is one component of a comprehensive MRSA colonization surveillance program. It is not intended to diagnose MRSA infection nor to guide or monitor treatment for MRSA infections.     RADIOLOGY:  No results found.   Management  plans discussed with the patient, family and they are in agreement.  CODE STATUS:     Code Status Orders        Start     Ordered   05/31/15 2004  Do not attempt resuscitation (DNR)   Continuous    Question Answer Comment  In the event of cardiac or respiratory ARREST Do not call a "code blue"   In the event of cardiac or respiratory ARREST Do not perform Intubation, CPR, defibrillation or ACLS   In the event of cardiac or respiratory ARREST Use medication by any route, position, wound care, and other measures to relive pain and suffering. May use oxygen, suction and manual treatment of airway obstruction as needed for comfort.      05/31/15 2003    Advance Directive Documentation        Most Recent Value   Type of Advance Directive  Healthcare Power of Attorney, Living will   Pre-existing out of facility DNR order (yellow form or pink MOST form)     "MOST" Form in Place?        TOTAL TIME TAKING CARE OF THIS PATIENT: 35 minutes.    Vaughan Basta M.D on 06/10/2015 at 10:10 AM  Between 7am to 6pm - Pager - [phone]  After 6pm go to [email or web address] - password [email or web address] [facility]  Office  [phone]  CC: Primary care physician; Ezequiel Kayser, MD

## 2015-06-10 NOTE — Care Management Important Message (Signed)
Important Message  Patient Details  Name: Bethany Russell MRN: [id number] Date of Birth: 02-Nov-1940   Medicare Important Message Given:   (5th IM given)    Shelbie Ammons, RN 06/10/2015, 10:36 AM

## 2015-06-10 NOTE — Progress Notes (Signed)
Pt being discharged home, discharge and prescriptions reviewed with pt and family,states understanding, pt with no noted complaints at discharge

## 2015-06-13 LAB — SURGICAL PATHOLOGY

## 2015-09-20 ENCOUNTER — Encounter: Payer: Self-pay | Admitting: Primary Care

## 2015-09-20 ENCOUNTER — Ambulatory Visit (INDEPENDENT_AMBULATORY_CARE_PROVIDER_SITE_OTHER): Payer: Medicare Other | Admitting: Primary Care

## 2015-09-20 VITALS — BP 130/68 | HR 96 | Temp 98.5°F | Ht 64.0 in | Wt 136.0 lb

## 2015-09-20 DIAGNOSIS — E1310 Other specified diabetes mellitus with ketoacidosis without coma: Secondary | ICD-10-CM

## 2015-09-20 DIAGNOSIS — E785 Hyperlipidemia, unspecified: Secondary | ICD-10-CM | POA: Diagnosis not present

## 2015-09-20 DIAGNOSIS — K5190 Ulcerative colitis, unspecified, without complications: Secondary | ICD-10-CM | POA: Insufficient documentation

## 2015-09-20 DIAGNOSIS — F0391 Unspecified dementia with behavioral disturbance: Secondary | ICD-10-CM | POA: Diagnosis not present

## 2015-09-20 DIAGNOSIS — E1110 Type 2 diabetes mellitus with ketoacidosis without coma: Secondary | ICD-10-CM

## 2015-09-20 DIAGNOSIS — F411 Generalized anxiety disorder: Secondary | ICD-10-CM

## 2015-09-20 DIAGNOSIS — Z794 Long term (current) use of insulin: Secondary | ICD-10-CM

## 2015-09-20 DIAGNOSIS — F0390 Unspecified dementia without behavioral disturbance: Secondary | ICD-10-CM | POA: Insufficient documentation

## 2015-09-20 MED ORDER — ALPRAZOLAM 0.25 MG PO TABS: 0.2500 mg | ORAL_TABLET | Freq: Two times a day (BID) | ORAL | Status: DC | PRN

## 2015-09-20 MED ORDER — BUPROPION HCL ER (XL) 150 MG PO TB24: 150.0000 mg | ORAL_TABLET | ORAL | Status: DC

## 2015-09-20 NOTE — Assessment & Plan Note (Signed)
Long standing history of. Recent hospitalization in September [id number] due to complications. Follows with GI and is managed on mesalamine, protonix, and humira. No abdominal pain, diarrhea, bloody stools per family.

## 2015-09-20 NOTE — Progress Notes (Signed)
Subjective:    Patient ID: Bethany Russell, female    DOB: 07-21-1941, 74 y.o.   MRN: [id number]  HPI  Bethany Russell is a 74 year old female who presents today to establish care and discuss the problems mentioned below. Will obtain old records. She lives at home with her daughter. She is with both daughters today as she cannot answer questions due to Dementia. Her last physical was in November 2016 from her previous PCP.  1) Type 2 Diabetes: Diagnosed at age 66. Currently managed on Lantus 26 units daily in the morning, Humalog 10-14 units three times daily. They are checking her sugars three times daily and are running 150-200's. She is following with [facility] endocrinology. Her A1C was 9. Previous visit was several months ago.  2) Hypothyroidism: Diagnosed years ago. Currently managed on levothyroxine 25 mcg through [facility].  3) Ulcerative Colitis: Follows with Dr. Vira Agar with GI and will see him every 6 months. Next appointment is in February 2017. Currently managed on Humira, Mesalamine, pantoprazole. No recent flares since hospitalization in August 2016.  4) Hyperlipidemia: Currently managed on pravastatin 80 mg. Last lipid panel in November, and family believes it was normal.  5) GAD: Currently managed on Zoloft 100 mg. Her family is very concered about her daily nervousness, paranoia, and anxiety. Her symptoms are constant during the daytime hours, and family believes she may even be hallucinating as she will see her deceased husband and daughter. Her Zoloft was recently increased to 100 mg 6 weeks ago but her family has not noticed any difference in her behavior. Her family is requesting help for her symptoms. She sleeps everynight at bedtime.   6) Heart Disease: Managed with Dr. Nehemiah Massed with [facility]. See him every 6 months to one year. Currently managed on metoprolol XL 25 mg. Triple bypass surgery in 1990.   7) Dementia: History of alcoholism in youth. Memory issues  for years, but never formally diagnosed with Dementia. Increased anxiety and panic over the past year, more so worsening over the past 3 months. She is currently managed on Zoloft 100 mg daily 6 weeks ago and family has not noticed improved.   8) Vitamin D deficiency: Currently managed on 50,000 unit capsules and has been on for years. Last vitamin D level in November was below goal per family.  Review of Systems  Constitutional: Negative for unexpected weight change.  HENT: Negative for rhinorrhea.   Respiratory: Negative for cough and shortness of breath.   Cardiovascular: Negative for chest pain.  Gastrointestinal: Negative for abdominal pain, diarrhea and constipation.  Genitourinary: Negative for difficulty urinating.       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  Neurological: Negative for dizziness, numbness and headaches.  Psychiatric/Behavioral: Positive for hallucinations and agitation. Negative for suicidal ideas and sleep disturbance. The patient is nervous/anxious.        Past Medical History  Diagnosis Date  . Ulcerative colitis ([place])   . Hypertension   . Diabetes mellitus without complication ([place])   . Cancer ([place])     skin cancer  . Coronary artery disease     Social History   Social History  . Marital Status: Widowed    Spouse Name: N/A  . Number of Children: N/A  . Years of Education: N/A   Occupational History  . Not on file.   Social History Main Topics  . Smoking  status: Never Smoker   . Smokeless tobacco: Not on file  . Alcohol Use: No  . Drug Use: Not on file  . Sexual Activity: Not on file   Other Topics Concern  . Not on file   Social History Narrative   Single.   Living at home.   Dementia.   Enjoys watching TV.    Past Surgical History  Procedure Laterality Date  . Coronary artery bypass graft    . Skin biopsy    . Flexible sigmoidoscopy N/A  06/08/2015    Procedure: FLEXIBLE SIGMOIDOSCOPY;  Surgeon: Manya Silvas, MD;  Location: [facility] ENDOSCOPY;  Service: Endoscopy;  Laterality: N/A;  . Colonoscopy N/A 06/09/2015    Procedure: COLONOSCOPY;  Surgeon: Manya Silvas, MD;  Location: [facility] ENDOSCOPY;  Service: Endoscopy;  Laterality: N/A;    Family History  Problem Relation Age of Onset  . CAD Mother   . CAD Father   . Colon cancer Brother     No Known Allergies  Current Outpatient Prescriptions on File Prior to Visit  Medication Sig Dispense Refill  . acetaminophen-codeine (TYLENOL #3) 300-30 MG per tablet Take 1 tablet by mouth 3 (three) times daily.     Marland Kitchen alendronate (FOSAMAX) 70 MG tablet Take 70 mg by mouth once a week. Pt takes on Sunday.    . diclofenac sodium (VOLTAREN) 1 % GEL Apply 2 g topically 4 (four) times daily as needed (for pain).     . feeding supplement, GLUCERNA SHAKE, (GLUCERNA SHAKE) LIQD Take 237 mLs by mouth 3 (three) times daily with meals. 60 Can 4  . ferrous sulfate 325 (65 FE) MG tablet Take 325 mg by mouth at bedtime.     Marland Kitchen HUMIRA PEN 40 MG/0.8ML PNKT Inject 40 mg into the skin every 14 (fourteen) days. Pt uses on Tuesday.    . insulin glargine (LANTUS) 100 UNIT/ML injection Inject 26 Units into the skin daily.    . insulin lispro (HUMALOG) 100 UNIT/ML injection Inject 8-16 Units into the skin 3 (three) times daily as needed for high blood sugar. Pt uses per sliding scale at every meal.    . levothyroxine (SYNTHROID, LEVOTHROID) 25 MCG tablet Take 25 mcg by mouth daily.     Marland Kitchen loperamide (IMODIUM) 2 MG capsule Take 2 mg by mouth 2 (two) times daily.     . Melatonin 3 MG TABS Take 3 mg by mouth at bedtime.     . Mesalamine (ASACOL HD) 800 MG TBEC Take 800 mg by mouth 3 (three) times daily.     . metoprolol succinate (TOPROL-XL) 25 MG 24 hr tablet Take 25 mg by mouth daily.    Marland Kitchen oxyCODONE (OXY IR/ROXICODONE) 5 MG immediate release tablet Take 1 tablet (5 mg total) by mouth every 4 (four) hours as needed  for moderate pain. 30 tablet 0  . pantoprazole (PROTONIX) 40 MG tablet Take 40 mg by mouth daily.     . pravastatin (PRAVACHOL) 80 MG tablet Take 80 mg by mouth at bedtime.     . predniSONE (DELTASONE) 10 MG tablet Take 5 tablets (50 mg total) by mouth daily with breakfast. (Patient taking differently: Take by mouth daily with breakfast. ) 60 tablet 3  . Vitamin D, Ergocalciferol, (DRISDOL) 50000 UNITS CAPS capsule Take 50,000 Units by mouth once a week. Pt takes on Sunday.     No current facility-administered medications on file prior to visit.    BP 130/68 mmHg  Pulse 96  Temp(Src) 98.5  F (36.9 C) (Oral)  Ht 5\' 4"  (1.626 m)  Wt 136 lb (61.689 kg)  BMI 23.33 kg/m2  SpO2 98%    Objective:   Physical Exam  Constitutional: She appears well-nourished.  HENT:  Head: Normocephalic.  Neck: Neck supple.  Cardiovascular: Normal rate and regular rhythm.   Pulmonary/Chest: Effort normal and breath sounds normal.  Neurological: She is alert.  Skin: Skin is warm and dry.  Multiple scabs to bilateral upper extremities, multiple stages of healing.  Psychiatric:  Pleasantly confused, poor historian. Mood appropriate, however picking at arms.          Assessment & Plan:  >45 minutes spent face to face with patient, >50% spent counseling or coordinating care.

## 2015-09-20 NOTE — Assessment & Plan Note (Signed)
Currently following with Dr. Nehemiah Massed with [facility].  History of triple bypass in 1990.  Follows semi-annually. No chest pain, managed on toprol 25 mg.

## 2015-09-20 NOTE — Assessment & Plan Note (Signed)
Managed on humalog and lantus. Managed by endocrinology through [facility]. Last A1C was 9 per family.

## 2015-09-20 NOTE — Patient Instructions (Signed)
Start Bupropion XL 150 mg every morning for irritation and anxiety. Continue Zoloft 100 mg.  Stop by the front and speak with Rosaria Ferries regarding your referral to psychiatry.  Follow up 6 months for re-evaluation or in 2 months if you cannot get into psychiatry sooner.  It was a pleasure to meet you today! Please don't hesitate to call me with any questions. Welcome to [facility]!

## 2015-09-20 NOTE — Assessment & Plan Note (Signed)
No formal diagnosis per familly, will obtain old records. Patient unable to participate in exam, poor historian. Pleasantly confused today. History of alcoholism.  Lives with daughter who has daytime sitter at home and 24 hour monitoring.  Also with behavorial disturbance. Added bupropion to regimen as well as PRN xanax.   Referral placed to psychiatry for further evaluation of behavioral disturbances.

## 2015-09-20 NOTE — Assessment & Plan Note (Signed)
Currently managed on Zoloft 100 mg. Daily anxiety, nervousness, paranoia per family. Some hallucinations. Family very stressed regarding symptoms and would like assistance.  Will add Buproprion XL to regimen and low dose Xanax PRN (per family request). Family has someone with patient 24 hours daily. Advised that Xanax can cause falls and family understands and would like to proceed.   Referral also made to geriatric psychiatry for psychotic behaviors of picking and hallucinations.

## 2015-09-20 NOTE — Progress Notes (Signed)
Pre visit review using our clinic review tool, if applicable. No additional management support is needed unless otherwise documented below in the visit note. 

## 2015-09-20 NOTE — Assessment & Plan Note (Signed)
Currently managed on pravastatin 80 mg. Will obtain records for recent lipid panel. No complaints of muscle aches per family.

## 2015-10-10 ENCOUNTER — Encounter: Payer: Self-pay | Admitting: Primary Care

## 2015-10-10 ENCOUNTER — Other Ambulatory Visit: Payer: Self-pay | Admitting: Primary Care

## 2015-10-10 DIAGNOSIS — I25119 Atherosclerotic heart disease of native coronary artery with unspecified angina pectoris: Secondary | ICD-10-CM

## 2015-10-10 MED ORDER — METOPROLOL SUCCINATE ER 25 MG PO TB24: 25.0000 mg | ORAL_TABLET | Freq: Every day | ORAL | Status: AC

## 2015-10-11 ENCOUNTER — Ambulatory Visit (INDEPENDENT_AMBULATORY_CARE_PROVIDER_SITE_OTHER): Payer: 59 | Admitting: Licensed Clinical Social Worker

## 2015-10-11 DIAGNOSIS — F411 Generalized anxiety disorder: Secondary | ICD-10-CM | POA: Diagnosis not present

## 2015-10-11 NOTE — Progress Notes (Signed)
Patient:   Bethany Russell   DOB:   [birth year]  MR Number:  [id number]  Location:  [facility] [address] [place] [address] Dept: [phone]           Date of Service:   10/11/2015  Start Time:   2p End Time:   3p  Provider/Observer:  [place] Counselor       Billing Code/Service: [phone]  Behavioral Observation: [place]  presents as a 75 y.o.-year-old Caucasian Female who appeared her stated age. her dress was Appropriate and she was Casual and her manners were Appropriate, we will further address this issue at our next visit to the situation.  There were not any physical disabilities noted.  she displayed an appropriate level of cooperation and motivation.    Interactions:    Active   Attention:   within normal limits  Memory:   within normal limits  Speech (Volume):  normal  Speech:   normal pitch and normal volume  Thought Process:  Coherent and Relevant  Though Content:  WNL  Orientation:   person, place, time/date and situation  Judgment:   Fair  Planning:   Fair  Affect:    Appropriate  Mood:    Irritable  Insight:   Good  Intelligence:   normal  Chief Complaint:    No chief complaint on file.   Reason for Service:  "Everyone tells me that something is wrong."  Current Symptoms:  Anxious, nervous, since her last hospitalization in August, loss of memory, agitated, paranoid (night time only), aggressive (throwing things)  Source of Distress:              forgetfulness  Marital Status/Living: Widowed for the past 6 years/llives with daughter, 2 Grandchildren and son in Sports coach for the past 5 years  Employment History: Worked at [employer] for 15-20 years/ retired since 1997  Education:   [employer]; [employer] in [place]; graduated a year early (skipped a grade in high school)  Legal  History:  Denies  Careers adviser:  Denies   Religious/Spiritual Preferences:  [employer], Investment banker, corporate for 25 years  Family/Childhood History:                           Born in [place], has a younger brother; he is currently deceased due to [place].  Raised by both parents. Good relationship with both parents.  Dad passed in 1980; Mother passed in 2002.  Good relationship with brother.  Has a family history of dementia   Children/Grand-children:    Melissa deceased at 62, Valerie 48, Ginny 80 Stepchildren (Ray 57, Tina 58, Deidre [place] 11)  Grandchildren 19; only sees about 4 regularly  Natural/Informal Support:                          [name]   Substance Use:  There is a documented history of alcohol abuse confirmed by family members.  Began drinking alcohol in her mid to late 47s.  After being admitted into the hospital in 2011 she has not had a drink   Medical History:   Past Medical History  Diagnosis Date  . Ulcerative colitis ([place])   . Hypertension   . Diabetes mellitus without complication ([place])   . Cancer ([place])     skin cancer  . Coronary artery  disease   . GI bleed   . Urinary tract infection   . Vitamin D deficiency   . Dementia           Medication List       This list is accurate as of: 10/11/15  2:17 PM.  Always use your most recent med list.               acetaminophen-codeine 300-30 MG tablet  Commonly known as:  TYLENOL #3  Take 1 tablet by mouth 3 (three) times daily.     alendronate 70 MG tablet  Commonly known as:  FOSAMAX  Take 70 mg by mouth once a week. Pt takes on Sunday.     ALPRAZolam 0.25 MG tablet  Commonly known as:  XANAX  Take 1 tablet (0.25 mg total) by mouth 2 (two) times daily as needed for anxiety.     ASACOL HD 800 MG Tbec  Generic drug:  Mesalamine  Take 800 mg by mouth 3 (three) times daily.     buPROPion 150 MG 24 hr tablet  Commonly known as:  WELLBUTRIN XL  Take 1 tablet (150 mg total) by mouth every  morning.     feeding supplement (GLUCERNA SHAKE) Liqd  Take 237 mLs by mouth 3 (three) times daily with meals.     ferrous sulfate 325 (65 FE) MG tablet  Take 325 mg by mouth at bedtime.     HUMIRA PEN 40 MG/0.8ML Pnkt  Generic drug:  Adalimumab  Inject 40 mg into the skin every 14 (fourteen) days. Pt uses on Tuesday.     insulin glargine 100 UNIT/ML injection  Commonly known as:  LANTUS  Inject 26 Units into the skin daily.     insulin lispro 100 UNIT/ML injection  Commonly known as:  HUMALOG  Inject 8-16 Units into the skin 3 (three) times daily as needed for high blood sugar. Pt uses per sliding scale at every meal.     levothyroxine 25 MCG tablet  Commonly known as:  SYNTHROID, LEVOTHROID  Take 25 mcg by mouth daily.     loperamide 2 MG capsule  Commonly known as:  IMODIUM  Take 2 mg by mouth 2 (two) times daily.     Melatonin 3 MG Tabs  Take 3 mg by mouth at bedtime.     metoprolol succinate 25 MG 24 hr tablet  Commonly known as:  TOPROL-XL  Take 1 tablet (25 mg total) by mouth daily.     oxyCODONE 5 MG immediate release tablet  Commonly known as:  Oxy IR/ROXICODONE  Take 1 tablet (5 mg total) by mouth every 4 (four) hours as needed for moderate pain.     pantoprazole 40 MG tablet  Commonly known as:  PROTONIX  Take 40 mg by mouth daily.     pravastatin 80 MG tablet  Commonly known as:  PRAVACHOL  Take 80 mg by mouth at bedtime.     predniSONE 10 MG tablet  Commonly known as:  DELTASONE  Take 5 tablets (50 mg total) by mouth daily with breakfast.     sertraline 100 MG tablet  Commonly known as:  ZOLOFT  Take 100 mg by mouth daily.     Vitamin D (Ergocalciferol) 50000 units Caps capsule  Commonly known as:  DRISDOL  Take 50,000 Units by mouth once a week. Pt takes on Sunday.     VOLTAREN 1 % Gel  Generic drug:  diclofenac sodium  Apply 2 g topically 4 (  four) times daily as needed (for pain).              Sexual History:   History  Sexual  Activity  . Sexual Activity: Not on file     Abuse/Trauma History: Denies    Psychiatric History:  Denies    Strengths:   Music, reading   Recovery Goals:  "Help with anxiety, anxiousness and paranoia so she can get care at home."  Hobbies/Interests:              puzzles   Challenges/Barriers: Loss of memory    Family Med/Psych History:  Family History  Problem Relation Age of Onset  . CAD Mother   . CAD Father   . Colon cancer Brother     Risk of Suicide/Violence: low   History of Suicide/Violence:  Denies  Psychosis:   Denies  Diagnosis:    GAD (generalized anxiety disorder)   Recommendation/Plan: Writer recommends Outpatient Therapy at least twice monthly to include but not limited to individual, group and or family therapy.  Medication Management is also recommended to assist with her mood.

## 2015-10-29 ENCOUNTER — Encounter: Payer: Self-pay | Admitting: Primary Care

## 2015-10-31 ENCOUNTER — Other Ambulatory Visit: Payer: Self-pay | Admitting: Primary Care

## 2015-10-31 DIAGNOSIS — E785 Hyperlipidemia, unspecified: Secondary | ICD-10-CM

## 2015-10-31 DIAGNOSIS — M810 Age-related osteoporosis without current pathological fracture: Secondary | ICD-10-CM

## 2015-10-31 MED ORDER — PRAVASTATIN SODIUM 80 MG PO TABS: 80.0000 mg | ORAL_TABLET | Freq: Every day | ORAL | Status: AC

## 2015-10-31 MED ORDER — ALENDRONATE SODIUM 70 MG PO TABS: 70.0000 mg | ORAL_TABLET | ORAL | Status: DC

## 2015-11-20 ENCOUNTER — Encounter: Payer: Self-pay | Admitting: Primary Care

## 2015-11-21 ENCOUNTER — Other Ambulatory Visit: Payer: Self-pay | Admitting: Primary Care

## 2015-11-21 DIAGNOSIS — F411 Generalized anxiety disorder: Secondary | ICD-10-CM

## 2015-11-21 DIAGNOSIS — E559 Vitamin D deficiency, unspecified: Secondary | ICD-10-CM

## 2015-11-21 MED ORDER — SERTRALINE HCL 100 MG PO TABS: 100.0000 mg | ORAL_TABLET | Freq: Every day | ORAL | Status: DC

## 2015-11-22 ENCOUNTER — Encounter: Payer: Self-pay | Admitting: Psychiatry

## 2015-11-22 ENCOUNTER — Other Ambulatory Visit (INDEPENDENT_AMBULATORY_CARE_PROVIDER_SITE_OTHER): Payer: Medicare Other

## 2015-11-22 ENCOUNTER — Ambulatory Visit (INDEPENDENT_AMBULATORY_CARE_PROVIDER_SITE_OTHER): Payer: 59 | Admitting: Psychiatry

## 2015-11-22 ENCOUNTER — Other Ambulatory Visit: Payer: Self-pay | Admitting: Primary Care

## 2015-11-22 ENCOUNTER — Other Ambulatory Visit: Payer: Self-pay | Admitting: Psychiatry

## 2015-11-22 VITALS — BP 118/60 | HR 102 | Temp 99.0°F | Ht 64.0 in | Wt 134.2 lb

## 2015-11-22 DIAGNOSIS — E559 Vitamin D deficiency, unspecified: Secondary | ICD-10-CM | POA: Diagnosis not present

## 2015-11-22 DIAGNOSIS — G309 Alzheimer's disease, unspecified: Secondary | ICD-10-CM | POA: Diagnosis not present

## 2015-11-22 DIAGNOSIS — F0280 Dementia in other diseases classified elsewhere without behavioral disturbance: Secondary | ICD-10-CM

## 2015-11-22 DIAGNOSIS — Z794 Long term (current) use of insulin: Principal | ICD-10-CM

## 2015-11-22 DIAGNOSIS — E119 Type 2 diabetes mellitus without complications: Secondary | ICD-10-CM

## 2015-11-22 DIAGNOSIS — I2510 Atherosclerotic heart disease of native coronary artery without angina pectoris: Secondary | ICD-10-CM | POA: Insufficient documentation

## 2015-11-22 LAB — VITAMIN D 25 HYDROXY (VIT D DEFICIENCY, FRACTURES): VITD: 54.16 ng/mL (ref 30.00–100.00)

## 2015-11-22 MED ORDER — MEMANTINE HCL ER 14 MG PO CP24: 14.0000 mg | ORAL_CAPSULE | Freq: Every day | ORAL | Status: DC

## 2015-11-22 MED ORDER — INSULIN GLARGINE 100 UNIT/ML ~~LOC~~ SOLN: 26.0000 [IU] | Freq: Every day | SUBCUTANEOUS | Status: DC

## 2015-11-22 MED ORDER — HUMIRA PEN 40 MG/0.8ML ~~LOC~~ PNKT: 40.0000 mg | PEN_INJECTOR | SUBCUTANEOUS | Status: AC

## 2015-11-22 NOTE — Progress Notes (Signed)
Psychiatric Initial Adult Assessment   Patient Identification: Bethany Russell MRN:  [id number] Date of Evaluation:  11/22/2015 Referral Source: [facility].  Chief Complaint:   Chief Complaint    Establish Care; Anxiety; Hallucinations; Other; Panic Attack     Visit Diagnosis:    ICD-9-CM ICD-10-CM   1. Dementia in Alzheimer's disease 331.0 G30.9    294.10 F02.80    Diagnosis:   Patient Active Problem List   Diagnosis Date Noted  . Arteriosclerosis of coronary artery [I25.10] 11/22/2015  . Ulcerative colitis ([place]) [K51.90] 09/20/2015  . GAD (generalized anxiety disorder) [F41.1] 09/20/2015  . Hyperlipidemia [E78.5] 09/20/2015  . Dementia [F03.90] 09/20/2015  . Coronary artery disease [I25.10] 06/07/2015  . Acute posthemorrhagic anemia [D62] 06/07/2015  . DM (diabetes mellitus) type 2, uncontrolled, with ketoacidosis ([place]) [E13.10] 06/07/2015  . Pressure ulcer [L89.90] 06/01/2015  . Protein-calorie malnutrition, severe ([place]) [E43] 06/01/2015  . Severe protein-calorie malnutrition Altamease Oiler: less than 60% of standard weight) ([place]) [E43] 06/01/2015  . Sepsis ([place]) [A41.9] 05/31/2015  . Dysthymia [F34.1] 02/04/2015  . Other long term (current) drug therapy [Z79.899] 02/04/2015  . Acquired hypothyroidism [E03.9] 08/06/2014  . Chronic kidney disease, stage III (moderate) [N18.3] 08/06/2014  . 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 H/O adenomatous polyp of colon [Z86.010] 08/06/2014  . 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 HLD (hyperlipidemia) [E78.5] 08/06/2014  . 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 Type 2 diabetes mellitus ([place]) [E11.9] 08/06/2014  . 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 Bradycardia [R00.1] 06/15/2014  . Absolute anemia [D64.9] 05/12/2014  . BP (high blood pressure) [I10] 05/12/2014  . 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 Avitaminosis D  [E55.9] 05/12/2014   History of Present Illness:    She is a 75 year old white female who presented for initial assessment accompanied by her daughter. Her daughter reported that she has been diagnosed with dementia but she was admitted to the [facility] rating to her colitis. However she has never seen a psychiatrist or neurologist in the past. Her daughter reported that she was never given any medications to help with her dementia. Her daughter reported that patient has been having hallucinations as well as behavioral problems and she has been living with her daughter for the past 5 years. Her daughter is concerned about her declining memory. She reported that sometimes it becomes difficult to control the patient. She was started on Zoloft by her primary care physician and has been taking the medication for the last couple of years. She seems stable on the medication. Her daughter was concerned about her memory. Patient did not participate in the interview as she is also hard of hearing. During my interview patient reported that she is feeling fine and her stomach is grawling as she might be feeling hungry.  I completed a Mini-Mental Status exam on the patient and she scored 15 out of 30 as she has poor retention, concentration and was unable to recall as well as clock drawing test. She was unable to write a complete sentence and not able to copy the intersecting pentagons. She was also talking about the hand coming out of the wall. Her daughter reported that she has been hallucinating at times and talks about the people coming out of the walls or as well as walking out of the window.   Elements:  Severity:  mild. Associated Signs/Symptoms: Depression Symptoms:  impaired memory, (Hypo) Manic Symptoms:  Delusions, Anxiety Symptoms:  anxiety related to dementia Psychotic Symptoms:  Hallucinations: Visual PTSD Symptoms: Negative NA  Past Medical History:  Past Medical History   Diagnosis Date  . Ulcerative colitis ([place])   . Hypertension   . Diabetes mellitus without complication ([place])   . Cancer ([place])     skin cancer  . Coronary artery disease   . GI bleed   . Urinary tract infection   . Vitamin D deficiency   . Dementia   . Diabetes mellitus, type II ([place])   . Thyroid disease     Past Surgical History  Procedure Laterality Date  . Coronary artery bypass graft    . Skin biopsy    . Flexible sigmoidoscopy N/A 06/08/2015    Procedure: FLEXIBLE SIGMOIDOSCOPY;  Surgeon: Manya Silvas, MD;  Location: [facility] ENDOSCOPY;  Service: Endoscopy;  Laterality: N/A;  . Colonoscopy N/A 06/09/2015    Procedure: COLONOSCOPY;  Surgeon: Manya Silvas, MD;  Location: [facility] ENDOSCOPY;  Service: Endoscopy;  Laterality: N/A;   Family History:  Family History  Problem Relation Age of Onset  . CAD Mother   . CAD Father   . Colon cancer Brother    Social History:   Social History   Social History  . Marital Status: Widowed    Spouse Name: N/A  . Number of Children: N/A  . Years of Education: N/A   Social History Main Topics  . Smoking status: Never Smoker   . Smokeless tobacco: None  . Alcohol Use: No  . Drug Use: No  . Sexual Activity: No   Other Topics Concern  . None   Social History Narrative   Single.   Living at home.   Dementia.   Enjoys watching TV.   Additional Social History:  Patient is currently widowed as her husband passed away in 2009/01/26. She is living with her daughter and her family.  Musculoskeletal: Strength & Muscle Tone: within normal limits Gait & Station: normal Patient leans: N/A  Psychiatric Specialty Exam: HPI  ROS  Blood pressure 118/60, pulse 102, temperature 99 F (37.2 C), temperature source Tympanic, height 5\' 4"  (1.626 m), weight 134 lb 3.2 oz (60.873 kg), SpO2 95 %.Body mass index is 23.02 kg/(m^2).  General Appearance: Casual  Eye Contact:  Fair  Speech:  Clear and Coherent  Volume:  Decreased  Mood:  Anxious   Affect:  Congruent  Thought Process:  Circumstantial  Orientation:  Full (Time, Place, and Person)  Thought Content:  Hallucinations: Visual  Suicidal Thoughts:  No  Homicidal Thoughts:  No  Memory:  Immediate;   Poor  Judgement:  Fair  Insight:  Fair  Psychomotor Activity:  Psychomotor Retardation  Concentration:  Fair  Recall:  [place]: Fair  Akathisia:  No  Handed:  Right  AIMS (if indicated):    Assets:  Quarry manager  ADL's:  Intact  Cognition: Impaired,  Moderate  Sleep:      Allergies:  No Known Allergies Current Medications: Current Outpatient Prescriptions  Medication Sig Dispense Refill  . alendronate (FOSAMAX) 70 MG tablet Take 1 tablet (70 mg total) by mouth once a week. 12 tablet 3  . aspirin 81 MG tablet Take 81 mg by mouth daily.    . clopidogrel (PLAVIX) 75 MG tablet Take 75 mg by mouth daily.    . diclofenac sodium (VOLTAREN) 1 % GEL Apply 2 g topically 4 (four) times daily as needed (for pain).     Marland Kitchen  feeding supplement, GLUCERNA SHAKE, (GLUCERNA SHAKE) LIQD Take 237 mLs by mouth 3 (three) times daily with meals. 60 Can 4  . ferrous sulfate 325 (65 FE) MG tablet Take 325 mg by mouth at bedtime.     Marland Kitchen HUMIRA PEN 40 MG/0.8ML PNKT Inject 40 mg into the skin every 14 (fourteen) days. Pt uses on 01-23-2023.    . insulin glargine (LANTUS) 100 UNIT/ML injection Inject 26 Units into the skin daily.    . insulin lispro (HUMALOG) 100 UNIT/ML injection Inject 8-16 Units into the skin 3 (three) times daily as needed for high blood sugar. Pt uses per sliding scale at every meal.    . levothyroxine (SYNTHROID, LEVOTHROID) 25 MCG tablet Take 25 mcg by mouth daily.     Marland Kitchen loperamide (IMODIUM) 2 MG capsule Take 2 mg by mouth 2 (two) times daily.     . Melatonin 3 MG TABS Take 3 mg by mouth at bedtime.     . Mesalamine (ASACOL HD) 800 MG TBEC Take 800 mg by mouth 3 (three) times daily.     . metoprolol  succinate (TOPROL-XL) 25 MG 24 hr tablet Take 1 tablet (25 mg total) by mouth daily. 90 tablet 2  . pantoprazole (PROTONIX) 40 MG tablet Take 40 mg by mouth daily.     . pravastatin (PRAVACHOL) 80 MG tablet Take 1 tablet (80 mg total) by mouth at bedtime. 90 tablet 3  . predniSONE (DELTASONE) 10 MG tablet Take 5 tablets (50 mg total) by mouth daily with breakfast. (Patient taking differently: Take by mouth daily with breakfast. ) 60 tablet 3  . sertraline (ZOLOFT) 100 MG tablet Take 1 tablet (100 mg total) by mouth daily. 90 tablet 2  . Vitamin D, Ergocalciferol, (DRISDOL) 50000 UNITS CAPS capsule Take 50,000 Units by mouth once a week. Pt takes on Sunday.     No current facility-administered medications for this visit.    Previous Psychotropic Medications:  Patient's daughter  reported that patient has long history of drinking alcohol in the past. She quit drinking in 01-22-2009 after the death of her husband. She went to the rehabilitation program at that time. Since then she has not been drinking. She has never seen a psychiatrist in the past. She is currently on Zoloft 100 mg daily. She was tried on trazodone while she was in the inpatient medical unit but did not do well on the medication. She currently takes melatonin to help with the sleep.  Substance Abuse History in the last 12 months:  No.  Consequences of Substance Abuse: Negative NA  Medical Decision Making:  Review of Psycho-Social Stressors (1)  Treatment Plan Summary: Medication management   Discussed with her daughter at length about the medications treatment risks benefits and alternatives I will start her on Namenda XR 14  mg daily. Coupon was also given to the daughter.  She will continue on Zoloft 100 mg as prescribed. She has supply of the medication. She was also advised to do labs including CBC RPR B12 folate and TSH level.  Follow-up in one month or earlier   More than 50% of the time spent in psychoeducation,  counseling and coordination of care.    This note was generated in part or whole with voice recognition software. Voice regonition is usually quite accurate but there are transcription errors that can and very often do occur. I apologize for any typographical errors that were not detected and corrected.     [name], MD  2/21/20171:17 PM

## 2015-11-22 NOTE — Progress Notes (Deleted)
BH MD/PA/NP OP Progress Note  11/22/2015 1:10 PM Bethany Russell  MRN:  [id number]  Subjective:  *** Chief Complaint:  Chief Complaint    Establish Care; Anxiety; Hallucinations; Other; Panic Attack     Visit Diagnosis:     ICD-9-CM ICD-10-CM   1. Dementia in Alzheimer's disease 331.0 G30.9    294.10 F02.80     Past Medical History:  Past Medical History  Diagnosis Date  . Ulcerative colitis ([place])   . Hypertension   . Diabetes mellitus without complication ([place])   . Cancer ([place])     skin cancer  . Coronary artery disease   . GI bleed   . Urinary tract infection   . Vitamin D deficiency   . Dementia   . Diabetes mellitus, type II ([place])   . Thyroid disease     Past Surgical History  Procedure Laterality Date  . Coronary artery bypass graft    . Skin biopsy    . Flexible sigmoidoscopy N/A 06/08/2015    Procedure: FLEXIBLE SIGMOIDOSCOPY;  Surgeon: Bethany Silvas, MD;  Location: [facility] ENDOSCOPY;  Service: Endoscopy;  Laterality: N/A;  . Colonoscopy N/A 06/09/2015    Procedure: COLONOSCOPY;  Surgeon: Bethany Silvas, MD;  Location: [facility] ENDOSCOPY;  Service: Endoscopy;  Laterality: N/A;   Family History:  Family History  Problem Relation Age of Onset  . CAD Mother   . CAD Father   . Colon cancer Brother    Social History:  Social History   Social History  . Marital Status: Widowed    Spouse Name: N/A  . Number of Children: N/A  . Years of Education: N/A   Social History Main Topics  . Smoking status: Never Smoker   . Smokeless tobacco: None  . Alcohol Use: No  . Drug Use: No  . Sexual Activity: No   Other Topics Concern  . None   Social History Narrative   Single.   Living at home.   Dementia.   Enjoys watching TV.   Additional History: ***  Assessment:   Musculoskeletal: Strength & Muscle Tone: {desc; muscle tone:32375} Gait & Station: {PE GAIT ED NATL:22525} Patient leans: {Patient Leans:21022755}  Psychiatric Specialty Exam: HPI  ROS   Blood pressure 118/60, pulse 102, temperature 99 F (37.2 C), temperature source Tympanic, height 5\' 4"  (1.626 m), weight 134 lb 3.2 oz (60.873 kg), SpO2 95 %.Body mass index is 23.02 kg/(m^2).  General Appearance: {Appearance:22683}  Eye Contact:  {BHH EYE CONTACT:22684}  Speech:  {Speech:22685}  Volume:  {Volume (PAA):22686}  Mood:  {BHH MOOD:22306}  Affect:  {Affect (PAA):22687}  Thought Process:  {Thought Process (PAA):22688}  Orientation:  {BHH ORIENTATION (PAA):22689}  Thought Content:  {Thought Content:22690}  Suicidal Thoughts:  {ST/HT (PAA):22692}  Homicidal Thoughts:  {ST/HT (PAA):22692}  Memory:  {BHH MEMORY:22881}  Judgement:  {Judgement (PAA):22694}  Insight:  {Insight (PAA):22695}  Psychomotor Activity:  {Psychomotor (PAA):22696}  Concentration:  {BHH GOOD/FAIR/POOR:22877}  Recall:  {BHH GOOD/FAIR/POOR:22877}  Fund of Knowledge: {BHH GOOD/FAIR/POOR:22877}  Language: {BHH GOOD/FAIR/POOR:22877}  Akathisia:  {BHH YES OR NO:22294}  Handed:  {Handed:22697}  AIMS (if indicated):  ***  Assets:  {Assets (PAA):22698}  ADL's:  {BHH TW:9249394  Cognition: {chl bhh cognition:304700322}  Sleep:  ***   Is the patient at risk to self?  {yes no:314532} Has the patient been a risk to self in the past 6 months?  {yes no:314532} Has the patient been a risk to self within the distant past?  {yes no:314532} Is the patient  a risk to others?  {yes no:314532} Has the patient been a risk to others in the past 6 months?  {yes no:314532} Has the patient been a risk to others within the distant past?  {yes no:314532}  Current Medications: Current Outpatient Prescriptions  Medication Sig Dispense Refill  . alendronate (FOSAMAX) 70 MG tablet Take 1 tablet (70 mg total) by mouth once a week. 12 tablet 3  . aspirin 81 MG tablet Take 81 mg by mouth daily.    . clopidogrel (PLAVIX) 75 MG tablet Take 75 mg by mouth daily.    . diclofenac sodium (VOLTAREN) 1 % GEL Apply 2 g topically 4 (four)  times daily as needed (for pain).     . feeding supplement, GLUCERNA SHAKE, (GLUCERNA SHAKE) LIQD Take 237 mLs by mouth 3 (three) times daily with meals. 60 Can 4  . ferrous sulfate 325 (65 FE) MG tablet Take 325 mg by mouth at bedtime.     Marland Kitchen HUMIRA PEN 40 MG/0.8ML PNKT Inject 40 mg into the skin every 14 (fourteen) days. Pt uses on Tuesday.    . insulin glargine (LANTUS) 100 UNIT/ML injection Inject 26 Units into the skin daily.    . insulin lispro (HUMALOG) 100 UNIT/ML injection Inject 8-16 Units into the skin 3 (three) times daily as needed for high blood sugar. Pt uses per sliding scale at every meal.    . levothyroxine (SYNTHROID, LEVOTHROID) 25 MCG tablet Take 25 mcg by mouth daily.     Marland Kitchen loperamide (IMODIUM) 2 MG capsule Take 2 mg by mouth 2 (two) times daily.     . Melatonin 3 MG TABS Take 3 mg by mouth at bedtime.     . Mesalamine (ASACOL HD) 800 MG TBEC Take 800 mg by mouth 3 (three) times daily.     . metoprolol succinate (TOPROL-XL) 25 MG 24 hr tablet Take 1 tablet (25 mg total) by mouth daily. 90 tablet 2  . pantoprazole (PROTONIX) 40 MG tablet Take 40 mg by mouth daily.     . pravastatin (PRAVACHOL) 80 MG tablet Take 1 tablet (80 mg total) by mouth at bedtime. 90 tablet 3  . predniSONE (DELTASONE) 10 MG tablet Take 5 tablets (50 mg total) by mouth daily with breakfast. (Patient taking differently: Take by mouth daily with breakfast. ) 60 tablet 3  . sertraline (ZOLOFT) 100 MG tablet Take 1 tablet (100 mg total) by mouth daily. 90 tablet 2  . Vitamin D, Ergocalciferol, (DRISDOL) 50000 UNITS CAPS capsule Take 50,000 Units by mouth once a week. Pt takes on Sunday.     No current facility-administered medications for this visit.    Medical Decision Making:  {bh medical decision making:21022756}  Treatment Plan Summary:{CHL AMB BH MD TX [id number]   Bethany Russell 11/22/2015, 1:10 PM

## 2015-11-23 ENCOUNTER — Other Ambulatory Visit: Payer: Self-pay | Admitting: Primary Care

## 2015-11-23 DIAGNOSIS — E119 Type 2 diabetes mellitus without complications: Secondary | ICD-10-CM

## 2015-11-23 DIAGNOSIS — Z794 Long term (current) use of insulin: Principal | ICD-10-CM

## 2015-11-23 LAB — CBC WITH DIFFERENTIAL/PLATELET
Basophils Absolute: 0 10*3/uL (ref 0.0–0.2)
Basos: 0 %
EOS (ABSOLUTE): 0 10*3/uL (ref 0.0–0.4)
Eos: 0 %
Hematocrit: 33.8 % — ABNORMAL LOW (ref 34.0–46.6)
Hemoglobin: 10.8 g/dL — ABNORMAL LOW (ref 11.1–15.9)
Immature Grans (Abs): 0 10*3/uL (ref 0.0–0.1)
Immature Granulocytes: 0 %
Lymphocytes Absolute: 1.9 10*3/uL (ref 0.7–3.1)
Lymphs: 11 %
MCH: 27.6 pg (ref 26.6–33.0)
MCHC: 32 g/dL (ref 31.5–35.7)
MCV: 86 fL (ref 79–97)
Monocytes Absolute: 1.4 10*3/uL — ABNORMAL HIGH (ref 0.1–0.9)
Monocytes: 8 %
Neutrophils Absolute: 14 10*3/uL — ABNORMAL HIGH (ref 1.4–7.0)
Neutrophils: 81 %
Platelets: 323 10*3/uL (ref 150–379)
RBC: 3.92 x10E6/uL (ref 3.77–5.28)
RDW: 17.3 % — ABNORMAL HIGH (ref 12.3–15.4)
WBC: 17.4 10*3/uL — ABNORMAL HIGH (ref 3.4–10.8)

## 2015-11-23 LAB — COMPREHENSIVE METABOLIC PANEL
ALT: 10 IU/L (ref 0–32)
AST: 18 IU/L (ref 0–40)
Albumin/Globulin Ratio: 1.6 (ref 1.1–2.5)
Albumin: 3.9 g/dL (ref 3.5–4.8)
Alkaline Phosphatase: 53 IU/L (ref 39–117)
BUN/Creatinine Ratio: 16 (ref 11–26)
BUN: 23 mg/dL (ref 8–27)
Bilirubin Total: 0.2 mg/dL (ref 0.0–1.2)
CO2: 19 mmol/L (ref 18–29)
Calcium: 8.6 mg/dL — ABNORMAL LOW (ref 8.7–10.3)
Chloride: 100 mmol/L (ref 96–106)
Creatinine, Ser: 1.41 mg/dL — ABNORMAL HIGH (ref 0.57–1.00)
GFR calc Af Amer: 42 mL/min/{1.73_m2} — ABNORMAL LOW (ref >59)
GFR calc non Af Amer: 36 mL/min/{1.73_m2} — ABNORMAL LOW (ref >59)
Globulin, Total: 2.5 g/dL (ref 1.5–4.5)
Glucose: 266 mg/dL — ABNORMAL HIGH (ref 65–99)
Potassium: 4.6 mmol/L (ref 3.5–5.2)
Sodium: 135 mmol/L (ref 134–144)
Total Protein: 6.4 g/dL (ref 6.0–8.5)

## 2015-11-23 LAB — TSH: TSH: 1.46 u[IU]/mL (ref 0.450–4.500)

## 2015-11-23 LAB — FOLATE: Folate: 18.7 ng/mL (ref >3.0)

## 2015-11-23 LAB — VITAMIN B12: Vitamin B-12: 430 pg/mL (ref 211–946)

## 2015-11-23 MED ORDER — INSULIN LISPRO 100 UNIT/ML ~~LOC~~ SOLN: 8.0000 [IU] | Freq: Three times a day (TID) | SUBCUTANEOUS | Status: DC | PRN

## 2015-11-24 ENCOUNTER — Other Ambulatory Visit: Payer: Self-pay | Admitting: Primary Care

## 2015-11-24 ENCOUNTER — Telehealth: Payer: Self-pay | Admitting: Psychiatry

## 2015-11-24 ENCOUNTER — Telehealth: Payer: Self-pay | Admitting: Primary Care

## 2015-11-24 ENCOUNTER — Emergency Department: Payer: Medicare Other

## 2015-11-24 ENCOUNTER — Encounter: Payer: Self-pay | Admitting: Primary Care

## 2015-11-24 ENCOUNTER — Inpatient Hospital Stay
Admission: EM | Admit: 2015-11-24 | Discharge: 2015-11-28 | DRG: 871 | Disposition: A | Discharge status: Skilled Nursing Facility | Payer: Medicare Other | Attending: Internal Medicine | Admitting: Internal Medicine

## 2015-11-24 ENCOUNTER — Ambulatory Visit
Admission: EM | Admit: 2015-11-24 | Discharge: 2015-11-24 | Discharge status: Absent without leave | Payer: Medicare Other | Source: Home / Self Care

## 2015-11-24 ENCOUNTER — Encounter: Payer: Self-pay | Admitting: Emergency Medicine

## 2015-11-24 DIAGNOSIS — Z85828 Personal history of other malignant neoplasm of skin: Secondary | ICD-10-CM

## 2015-11-24 DIAGNOSIS — E871 Hypo-osmolality and hyponatremia: Secondary | ICD-10-CM | POA: Diagnosis present

## 2015-11-24 DIAGNOSIS — Z7952 Long term (current) use of systemic steroids: Secondary | ICD-10-CM

## 2015-11-24 DIAGNOSIS — A4151 Sepsis due to Escherichia coli [E. coli]: Secondary | ICD-10-CM | POA: Diagnosis not present

## 2015-11-24 DIAGNOSIS — Z66 Do not resuscitate: Secondary | ICD-10-CM | POA: Diagnosis present

## 2015-11-24 DIAGNOSIS — Z79899 Other long term (current) drug therapy: Secondary | ICD-10-CM

## 2015-11-24 DIAGNOSIS — I129 Hypertensive chronic kidney disease with stage 1 through stage 4 chronic kidney disease, or unspecified chronic kidney disease: Secondary | ICD-10-CM | POA: Diagnosis present

## 2015-11-24 DIAGNOSIS — E039 Hypothyroidism, unspecified: Secondary | ICD-10-CM | POA: Diagnosis present

## 2015-11-24 DIAGNOSIS — N183 Chronic kidney disease, stage 3 (moderate): Secondary | ICD-10-CM | POA: Diagnosis present

## 2015-11-24 DIAGNOSIS — N179 Acute kidney failure, unspecified: Secondary | ICD-10-CM

## 2015-11-24 DIAGNOSIS — Z9889 Other specified postprocedural states: Secondary | ICD-10-CM

## 2015-11-24 DIAGNOSIS — A419 Sepsis, unspecified organism: Secondary | ICD-10-CM

## 2015-11-24 DIAGNOSIS — I2510 Atherosclerotic heart disease of native coronary artery without angina pectoris: Secondary | ICD-10-CM | POA: Diagnosis present

## 2015-11-24 DIAGNOSIS — B9620 Unspecified Escherichia coli [E. coli] as the cause of diseases classified elsewhere: Secondary | ICD-10-CM | POA: Diagnosis present

## 2015-11-24 DIAGNOSIS — K5190 Ulcerative colitis, unspecified, without complications: Secondary | ICD-10-CM | POA: Diagnosis present

## 2015-11-24 DIAGNOSIS — E876 Hypokalemia: Secondary | ICD-10-CM | POA: Diagnosis present

## 2015-11-24 DIAGNOSIS — N170 Acute kidney failure with tubular necrosis: Secondary | ICD-10-CM | POA: Diagnosis present

## 2015-11-24 DIAGNOSIS — Z794 Long term (current) use of insulin: Principal | ICD-10-CM

## 2015-11-24 DIAGNOSIS — E1122 Type 2 diabetes mellitus with diabetic chronic kidney disease: Secondary | ICD-10-CM | POA: Diagnosis present

## 2015-11-24 DIAGNOSIS — N12 Tubulo-interstitial nephritis, not specified as acute or chronic: Secondary | ICD-10-CM

## 2015-11-24 DIAGNOSIS — Z7982 Long term (current) use of aspirin: Secondary | ICD-10-CM

## 2015-11-24 DIAGNOSIS — N390 Urinary tract infection, site not specified: Secondary | ICD-10-CM | POA: Diagnosis present

## 2015-11-24 DIAGNOSIS — Z951 Presence of aortocoronary bypass graft: Secondary | ICD-10-CM

## 2015-11-24 DIAGNOSIS — E119 Type 2 diabetes mellitus without complications: Secondary | ICD-10-CM

## 2015-11-24 DIAGNOSIS — F0390 Unspecified dementia without behavioral disturbance: Secondary | ICD-10-CM | POA: Diagnosis present

## 2015-11-24 DIAGNOSIS — R6520 Severe sepsis without septic shock: Secondary | ICD-10-CM | POA: Diagnosis present

## 2015-11-24 DIAGNOSIS — Z800 Family history of malignant neoplasm of digestive organs: Secondary | ICD-10-CM

## 2015-11-24 DIAGNOSIS — Z8249 Family history of ischemic heart disease and other diseases of the circulatory system: Secondary | ICD-10-CM

## 2015-11-24 LAB — CBC WITH DIFFERENTIAL/PLATELET
BASOS ABS: 0.1 10*3/uL (ref 0–0.1)
BASOS PCT: 0 %
EOS ABS: 0 10*3/uL (ref 0–0.7)
Eosinophils Relative: 0 %
HEMATOCRIT: 34.6 % — AB (ref 35.0–47.0)
HEMOGLOBIN: 11 g/dL — AB (ref 12.0–16.0)
Lymphocytes Relative: 5 %
Lymphs Abs: 1.8 10*3/uL (ref 1.0–3.6)
MCH: 26.4 pg (ref 26.0–34.0)
MCHC: 31.9 g/dL — AB (ref 32.0–36.0)
MCV: 82.8 fL (ref 80.0–100.0)
MONOS PCT: 5 %
Monocytes Absolute: 1.6 10*3/uL — ABNORMAL HIGH (ref 0.2–0.9)
NEUTROS ABS: 29.5 10*3/uL — AB (ref 1.4–6.5)
NEUTROS PCT: 90 %
Platelets: 236 10*3/uL (ref 150–440)
RBC: 4.18 MIL/uL (ref 3.80–5.20)
RDW: 18.8 % — ABNORMAL HIGH (ref 11.5–14.5)
WBC: 33 10*3/uL — AB (ref 3.6–11.0)

## 2015-11-24 LAB — URINALYSIS COMPLETE WITH MICROSCOPIC (ARMC ONLY)
BILIRUBIN URINE: NEGATIVE
Glucose, UA: 150 mg/dL — AB
KETONES UR: NEGATIVE mg/dL
NITRITE: NEGATIVE
PH: 5 (ref 5.0–8.0)
PROTEIN: 100 mg/dL — AB
SPECIFIC GRAVITY, URINE: 1.015 (ref 1.005–1.030)
Squamous Epithelial / LPF: NONE SEEN

## 2015-11-24 LAB — COMPREHENSIVE METABOLIC PANEL
ALK PHOS: 91 U/L (ref 38–126)
ALT: 25 U/L (ref 14–54)
ANION GAP: 11 (ref 5–15)
AST: 28 U/L (ref 15–41)
Albumin: 3.4 g/dL — ABNORMAL LOW (ref 3.5–5.0)
BILIRUBIN TOTAL: 0.3 mg/dL (ref 0.3–1.2)
BUN: 37 mg/dL — ABNORMAL HIGH (ref 6–20)
CALCIUM: 8.5 mg/dL — AB (ref 8.9–10.3)
CO2: 17 mmol/L — ABNORMAL LOW (ref 22–32)
CREATININE: 1.8 mg/dL — AB (ref 0.44–1.00)
Chloride: 105 mmol/L (ref 101–111)
GFR, EST AFRICAN AMERICAN: 30 mL/min — AB (ref >60)
GFR, EST NON AFRICAN AMERICAN: 26 mL/min — AB (ref >60)
Glucose, Bld: 55 mg/dL — ABNORMAL LOW (ref 65–99)
Potassium: 3.5 mmol/L (ref 3.5–5.1)
SODIUM: 133 mmol/L — AB (ref 135–145)
TOTAL PROTEIN: 7.3 g/dL (ref 6.5–8.1)

## 2015-11-24 LAB — LACTIC ACID, PLASMA: LACTIC ACID, VENOUS: 1.1 mmol/L (ref 0.5–2.0)

## 2015-11-24 MED ORDER — DEXTROSE 5 % IV SOLN
1.0000 g | Freq: Once | INTRAVENOUS | Status: AC
  Administered 2015-11-24: 1 g via INTRAVENOUS
  Filled 2015-11-24: qty 10

## 2015-11-24 MED ORDER — INSULIN GLARGINE 100 UNIT/ML SOLOSTAR PEN: 26.0000 [IU] | PEN_INJECTOR | Freq: Every day | SUBCUTANEOUS | Status: AC

## 2015-11-24 MED ORDER — SODIUM CHLORIDE 0.9 % IV BOLUS (SEPSIS)
1000.0000 mL | Freq: Once | INTRAVENOUS | Status: AC
  Administered 2015-11-24: 1000 mL via INTRAVENOUS

## 2015-11-24 MED ORDER — INSULIN LISPRO 100 UNIT/ML (KWIKPEN): PEN_INJECTOR | SUBCUTANEOUS | Status: DC

## 2015-11-24 MED ORDER — SODIUM CHLORIDE 0.9 % IV BOLUS (SEPSIS)
1000.0000 mL | Freq: Once | INTRAVENOUS | Status: AC
  Administered 2015-11-25: 1000 mL via INTRAVENOUS

## 2015-11-24 NOTE — Telephone Encounter (Signed)
Bethany Russell stated she sent you a my chart @ 2:48 please disregard message about rx

## 2015-11-24 NOTE — Telephone Encounter (Signed)
Daughter called she cancel pt appointment for tomorrow she stated she was going to take her to urgent care today

## 2015-11-24 NOTE — Telephone Encounter (Signed)
Spoke with Pts Daughter about her Labs. Advised her about elevated WBC, Neutrophils and Cr. She stated that pt had high fever after she returned home after her appointment and she has been giving her tylenol. We discussed about potential UTI.  Her other labs are WNL.  She demonstarted understanding.

## 2015-11-24 NOTE — ED Notes (Signed)
Patient with fever that started Tuesday night. Patient was seen at her md's office Tuesday and was told that her wbcs were elevated. Patient with a recent history of uti with sepsis. Patient with fever of 103 at home per family and was given tylenol.

## 2015-11-24 NOTE — ED Provider Notes (Signed)
[facility] Emergency Department Provider Note  ____________________________________________  Time seen: 9:30 PM  I have reviewed the triage vital signs and the nursing notes.   HISTORY  Chief Complaint Fever Level 5 caveat:  Portions of the history and physical were unable to be obtained due to the patient's chronic dementia    HPI Bethany Russell is a 75 y.o. female with fever for 3 days. Highest temperature at home was 103. Has a history of sepsis due to urinary tract infections. She has chronic dementia but family thinks that she is at her baseline mental status at this point. She is eating and drinking although less than she should according to her daughters. They gave Tylenol at home for fever control. Denies abdominal pain chest pain headache neck stiffness or syncope. No shortness of breath or cough.     Past Medical History  Diagnosis Date  . Ulcerative colitis ([place])   . Hypertension   . Diabetes mellitus without complication ([place])   . Cancer ([place])     skin cancer  . Coronary artery disease   . GI bleed   . Urinary tract infection   . Vitamin D deficiency   . Dementia   . Diabetes mellitus, type II ([place])   . Thyroid disease      Patient Active Problem List   Diagnosis Date Noted  . Arteriosclerosis of coronary artery 11/22/2015  . Ulcerative colitis ([place]) 09/20/2015  . GAD (generalized anxiety disorder) 09/20/2015  . Hyperlipidemia 09/20/2015  . Dementia 09/20/2015  . Coronary artery disease 06/07/2015  . Acute posthemorrhagic anemia 06/07/2015  . DM (diabetes mellitus) type 2, uncontrolled, with ketoacidosis ([place]) 06/07/2015  . Pressure ulcer 06/01/2015  . Protein-calorie malnutrition, severe ([place]) 06/01/2015  . Severe protein-calorie malnutrition Altamease Oiler: less than 60% of standard weight) ([place]) 06/01/2015  . Sepsis ([place]) 05/31/2015  . Dysthymia 02/04/2015  . Other long term (current) drug therapy 02/04/2015  . Acquired  hypothyroidism 08/06/2014  . Chronic kidney disease, stage III (moderate) 08/06/2014  . 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 H/O adenomatous polyp of colon 08/06/2014  . Degeneration of intervertebral disc of lumbar region 08/06/2014  . HLD (hyperlipidemia) 08/06/2014  . 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 Type 2 diabetes mellitus ([place]) 08/06/2014  . Carotid artery narrowing 06/15/2014  . Bradycardia 06/15/2014  . Absolute anemia 05/12/2014  . BP (high blood pressure) 05/12/2014  . 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 Avitaminosis D 05/12/2014     Past Surgical History  Procedure Laterality Date  . Coronary artery bypass graft    . Skin biopsy    . Flexible sigmoidoscopy N/A 06/08/2015    Procedure: FLEXIBLE SIGMOIDOSCOPY;  Surgeon: Manya Silvas, MD;  [facility] END[facility]COPY;  Service: Endoscopy;  Laterality: N/A;  . Colonoscopy N/A 06/09/2015    Procedure: COLONOSCOPY;  Surgeon: Manya Silvas, MD;  Location: [facility] ENDOSCOPY;  Service: Endoscopy;  Laterality: N/A;     Current Outpatient Rx  Name  Route  Sig  Dispense  Refill  . alendronate (FOSAMAX) 70 MG tablet   Oral   Take 1 tablet (70 mg total) by mouth once a week.   12 tablet   3   . aspirin 81 MG tablet   Oral   Take 81 mg by mouth daily.         . clopidogrel (PLAVIX) 75 MG tablet   Oral   Take 75 mg by mouth daily.         Marland Kitchen  diclofenac sodium (VOLTAREN) 1 % GEL   Topical   Apply 2 g topically 4 (four) times daily as needed (for pain).          . feeding supplement, GLUCERNA SHAKE, (GLUCERNA SHAKE) LIQD   Oral   Take 237 mLs by mouth 3 (three) times daily with meals.   60 Can   4   . ferrous sulfate 325 (65 FE) MG tablet   Oral   Take 325 mg by mouth at bedtime.          Marland Kitchen HUMIRA PEN 40 MG/0.8ML PNKT   Subcutaneous   Inject 40 mg into the skin every 14 (fourteen) days. Pt uses on Tuesday.   2 each   5     Dispense as written.   . Insulin  Glargine (LANTUS SOLOSTAR) 100 UNIT/ML Solostar Pen   Subcutaneous   Inject 26 Units into the skin daily.   15 mL   11   . insulin lispro (HUMALOG KWIKPEN) 100 UNIT/ML KiwkPen      Inject 8-16 units three times daily based off blood sugar readings.   15 mL   11   . levothyroxine (SYNTHROID, LEVOTHROID) 25 MCG tablet   Oral   Take 25 mcg by mouth daily.          Marland Kitchen loperamide (IMODIUM) 2 MG capsule   Oral   Take 2 mg by mouth 2 (two) times daily.          . Melatonin 3 MG TABS   Oral   Take 3 mg by mouth at bedtime.          . memantine (NAMENDA XR) 14 MG CP24 24 hr capsule   Oral   Take 1 capsule (14 mg total) by mouth daily.   30 capsule   1   . Mesalamine (ASACOL HD) 800 MG TBEC   Oral   Take 800 mg by mouth 3 (three) times daily.          . metoprolol succinate (TOPROL-XL) 25 MG 24 hr tablet   Oral   Take 1 tablet (25 mg total) by mouth daily.   90 tablet   2   . pantoprazole (PROTONIX) 40 MG tablet   Oral   Take 40 mg by mouth daily.          . pravastatin (PRAVACHOL) 80 MG tablet   Oral   Take 1 tablet (80 mg total) by mouth at bedtime.   90 tablet   3   . predniSONE (DELTASONE) 10 MG tablet   Oral   Take 5 tablets (50 mg total) by mouth daily with breakfast. Patient taking differently: Take by mouth daily with breakfast.    60 tablet   3   . sertraline (ZOLOFT) 100 MG tablet   Oral   Take 1 tablet (100 mg total) by mouth daily.   90 tablet   2   . Vitamin D, Ergocalciferol, (DRISDOL) 50000 UNITS CAPS capsule   Oral   Take 50,000 Units by mouth once a week. Pt takes on Sunday.            Allergies Review of patient's allergies indicates no known allergies.   Family History  Problem Relation Age of Onset  . CAD Mother   . CAD Father   . Colon cancer Brother     Social History Social History  Substance Use Topics  . Smoking status: Never Smoker   . Smokeless tobacco: None  . Alcohol  Use: No    Review of  Systems  Constitutional:   Positive fever and chills. No weight changes Eyes:   No blurry vision or double vision.  ENT:   No sore throat.  Cardiovascular:   No chest pain. Respiratory:   No dyspnea or cough. Gastrointestinal:   Negative for abdominal pain, vomiting and diarrhea.  No BRBPR or melena. Genitourinary:   Negative for dysuria or difficulty urinating. Musculoskeletal:   Negative for back pain. No joint swelling or pain. Skin:   Negative for rash. Neurological:   Negative for headaches, focal weakness or numbness. Psychiatric:  No anxiety or depression.   Endocrine:  No changes in energy or sleep difficulty.  10-point ROS otherwise negative.  ____________________________________________   PHYSICAL EXAM:  VITAL SIGNS: ED Triage Vitals  Enc Vitals Group     BP 11/24/15 2204 119/70 mmHg     Pulse Rate 11/24/15 2204 117     Resp 11/24/15 2204 22     Temp 11/24/15 2204 98.3 F (36.8 C)     Temp Source 11/24/15 2204 Oral     SpO2 11/24/15 2204 96 %     Weight 11/24/15 2204 134 lb (60.782 kg)     Height 11/24/15 2204 5\' 4"  (1.626 m)     Head Cir --      Peak Flow --      Pain Score --      Pain Loc --      Pain Edu? --      Excl. in [place]? --     Vital signs reviewed, nursing assessments reviewed.   Constitutional:   Alert and oriented to person and place. Well appearing and in no distress. Eyes:   No scleral icterus. No conjunctival pallor. PERRL. EOMI ENT   Head:   Normocephalic and atraumatic.   Nose:   No congestion/rhinnorhea. No septal hematoma   Mouth/Throat:   Dry mucous membranes, no pharyngeal erythema. No peritonsillar mass.    Neck:   No stridor. No SubQ emphysema. No meningismus. Hematological/Lymphatic/Immunilogical:   No cervical lymphadenopathy. Cardiovascular:   Tachycardia heart rate 110. Symmetric bilateral radial and DP pulses.  No murmurs.  Respiratory:   Normal respiratory effort without tachypnea nor retractions. Breath sounds  are clear and equal bilaterally. No wheezes/rales/rhonchi. Gastrointestinal:   Soft with mild suprapubic tenderness. Non distended. There is mild right CVA tenderness.  No rebound, rigidity, or guarding. Genitourinary:   deferred Musculoskeletal:   Nontender with normal range of motion in all extremities. No joint effusions.  No lower extremity tenderness.  No edema. Neurologic:   Normal speech and language.  CN 2-10 normal. Motor grossly intact. No gross focal neurologic deficits are appreciated.  Skin:    Skin is warm, dry and intact. No rash noted.  No petechiae, purpura, or bullae. Psychiatric:   Mood and affect are normal. ____________________________________________    LABS (pertinent positives/negatives) (all labs ordered are listed, but only abnormal results are displayed) Labs Reviewed  COMPREHENSIVE METABOLIC PANEL - Abnormal; Notable for the following:    Sodium 133 (*)    CO2 17 (*)    Glucose, Bld 55 (*)    BUN 37 (*)    Creatinine, Ser 1.80 (*)    Calcium 8.5 (*)    Albumin 3.4 (*)    GFR calc non Af Amer 26 (*)    GFR calc Af Amer 30 (*)    All other components within normal limits  CBC WITH DIFFERENTIAL/PLATELET - Abnormal; Notable  for the following:    WBC 33.0 (*)    Hemoglobin 11.0 (*)    HCT 34.6 (*)    MCHC 31.9 (*)    RDW 18.8 (*)    Neutro Abs 29.5 (*)    Monocytes Absolute 1.6 (*)    All other components within normal limits  URINALYSIS COMPLETEWITH MICROSCOPIC ([facility] ONLY) - Abnormal; Notable for the following:    Color, Urine YELLOW (*)    APPearance TURBID (*)    Glucose, UA 150 (*)    Hgb urine dipstick 1+ (*)    Protein, ur 100 (*)    Leukocytes, UA 3+ (*)    Bacteria, UA MANY (*)    All other components within normal limits  CULTURE, BLOOD (ROUTINE X 2)  CULTURE, BLOOD (ROUTINE X 2)  URINE CULTURE  LACTIC ACID, PLASMA  LACTIC ACID, PLASMA  CBG MONITORING, ED    ____________________________________________   EKG    ____________________________________________    RADIOLOGY  Chest x-ray unremarkable  ____________________________________________   PROCEDURES  CRITICAL CARE Performed by: Joni Fears, Latriece Anstine   Total critical care time: 35 minutes  Critical care time was exclusive of separately billable procedures and treating other patients.  Critical care was necessary to treat or prevent imminent or life-threatening deterioration.  Critical care was time spent personally by me on the following activities: development of treatment plan with patient and/or surrogate as well as nursing, discussions with consultants, evaluation of patient's response to treatment, examination of patient, obtaining history from patient or surrogate, ordering and performing treatments and interventions, ordering and review of laboratory studies, ordering and review of radiographic studies, pulse oximetry and re-evaluation of patient's condition.  ____________________________________________   INITIAL IMPRESSION / ASSESSMENT AND PLAN / ED COURSE  Pertinent labs & imaging results that were available during my care of the patient were reviewed by me and considered in my medical decision making (see chart for details).  Patient presents with fever or tachycardia but apparently baseline mental status and no hypotension. Does not qualify for a code sepsis but we'll initiate septic workup.  ----------------------------------------- 11:56 PM on 11/24/2015 -----------------------------------------  Workup reveals urinary tract infection which in this setting is consistent with pyelonephritis. In addition creatinine is gone from a baseline of 1.0 to 1.8 over the last 48 hours. Her white blood cell count is 33,000. Cultures are sent, IV fluids and IV ceftriaxone. Case discussed with hospitalist for  admission.     ____________________________________________   FINAL CLINICAL IMPRESSION(S) / ED DIAGNOSES  Final diagnoses:  Pyelonephritis  Sepsis, due to unspecified organism [facility])      Carrie Mew, MD 11/24/15 2358

## 2015-11-25 ENCOUNTER — Encounter: Payer: Self-pay | Admitting: Internal Medicine

## 2015-11-25 ENCOUNTER — Inpatient Hospital Stay: Payer: Medicare Other

## 2015-11-25 ENCOUNTER — Ambulatory Visit: Payer: Medicare Other | Admitting: Primary Care

## 2015-11-25 DIAGNOSIS — Z794 Long term (current) use of insulin: Secondary | ICD-10-CM | POA: Diagnosis not present

## 2015-11-25 DIAGNOSIS — B9620 Unspecified Escherichia coli [E. coli] as the cause of diseases classified elsewhere: Secondary | ICD-10-CM | POA: Diagnosis present

## 2015-11-25 DIAGNOSIS — A4151 Sepsis due to Escherichia coli [E. coli]: Secondary | ICD-10-CM | POA: Diagnosis present

## 2015-11-25 DIAGNOSIS — Z800 Family history of malignant neoplasm of digestive organs: Secondary | ICD-10-CM | POA: Diagnosis not present

## 2015-11-25 DIAGNOSIS — E1122 Type 2 diabetes mellitus with diabetic chronic kidney disease: Secondary | ICD-10-CM | POA: Diagnosis present

## 2015-11-25 DIAGNOSIS — N170 Acute kidney failure with tubular necrosis: Secondary | ICD-10-CM | POA: Diagnosis present

## 2015-11-25 DIAGNOSIS — Z85828 Personal history of other malignant neoplasm of skin: Secondary | ICD-10-CM | POA: Diagnosis not present

## 2015-11-25 DIAGNOSIS — K5190 Ulcerative colitis, unspecified, without complications: Secondary | ICD-10-CM | POA: Diagnosis present

## 2015-11-25 DIAGNOSIS — N183 Chronic kidney disease, stage 3 (moderate): Secondary | ICD-10-CM | POA: Diagnosis present

## 2015-11-25 DIAGNOSIS — Z7952 Long term (current) use of systemic steroids: Secondary | ICD-10-CM | POA: Diagnosis not present

## 2015-11-25 DIAGNOSIS — I129 Hypertensive chronic kidney disease with stage 1 through stage 4 chronic kidney disease, or unspecified chronic kidney disease: Secondary | ICD-10-CM | POA: Diagnosis present

## 2015-11-25 DIAGNOSIS — N12 Tubulo-interstitial nephritis, not specified as acute or chronic: Secondary | ICD-10-CM | POA: Diagnosis present

## 2015-11-25 DIAGNOSIS — Z7982 Long term (current) use of aspirin: Secondary | ICD-10-CM | POA: Diagnosis not present

## 2015-11-25 DIAGNOSIS — Z8249 Family history of ischemic heart disease and other diseases of the circulatory system: Secondary | ICD-10-CM | POA: Diagnosis not present

## 2015-11-25 DIAGNOSIS — Z951 Presence of aortocoronary bypass graft: Secondary | ICD-10-CM | POA: Diagnosis not present

## 2015-11-25 DIAGNOSIS — Z79899 Other long term (current) drug therapy: Secondary | ICD-10-CM | POA: Diagnosis not present

## 2015-11-25 DIAGNOSIS — E871 Hypo-osmolality and hyponatremia: Secondary | ICD-10-CM | POA: Diagnosis present

## 2015-11-25 DIAGNOSIS — Z9889 Other specified postprocedural states: Secondary | ICD-10-CM | POA: Diagnosis not present

## 2015-11-25 DIAGNOSIS — F0390 Unspecified dementia without behavioral disturbance: Secondary | ICD-10-CM | POA: Diagnosis present

## 2015-11-25 DIAGNOSIS — N390 Urinary tract infection, site not specified: Secondary | ICD-10-CM | POA: Diagnosis present

## 2015-11-25 DIAGNOSIS — E876 Hypokalemia: Secondary | ICD-10-CM | POA: Diagnosis present

## 2015-11-25 DIAGNOSIS — I2510 Atherosclerotic heart disease of native coronary artery without angina pectoris: Secondary | ICD-10-CM | POA: Diagnosis present

## 2015-11-25 DIAGNOSIS — E039 Hypothyroidism, unspecified: Secondary | ICD-10-CM | POA: Diagnosis present

## 2015-11-25 DIAGNOSIS — R6520 Severe sepsis without septic shock: Secondary | ICD-10-CM | POA: Diagnosis present

## 2015-11-25 DIAGNOSIS — Z66 Do not resuscitate: Secondary | ICD-10-CM | POA: Diagnosis present

## 2015-11-25 LAB — BASIC METABOLIC PANEL
Anion gap: 7 (ref 5–15)
BUN: 34 mg/dL — AB (ref 6–20)
CALCIUM: 7.1 mg/dL — AB (ref 8.9–10.3)
CHLORIDE: 106 mmol/L (ref 101–111)
CO2: 19 mmol/L — AB (ref 22–32)
CREATININE: 1.75 mg/dL — AB (ref 0.44–1.00)
GFR calc non Af Amer: 27 mL/min — ABNORMAL LOW (ref >60)
GFR, EST AFRICAN AMERICAN: 31 mL/min — AB (ref >60)
Glucose, Bld: 109 mg/dL — ABNORMAL HIGH (ref 65–99)
Potassium: 3.3 mmol/L — ABNORMAL LOW (ref 3.5–5.1)
SODIUM: 132 mmol/L — AB (ref 135–145)

## 2015-11-25 LAB — BLOOD CULTURE ID PANEL (REFLEXED)
Acinetobacter baumannii: NOT DETECTED
CANDIDA ALBICANS: NOT DETECTED
CANDIDA GLABRATA: NOT DETECTED
CANDIDA KRUSEI: NOT DETECTED
CANDIDA TROPICALIS: NOT DETECTED
Candida parapsilosis: NOT DETECTED
Carbapenem resistance: NOT DETECTED
ENTEROBACTER CLOACAE COMPLEX: NOT DETECTED
ENTEROBACTERIACEAE SPECIES: DETECTED — AB
ESCHERICHIA COLI: DETECTED — AB
Enterococcus species: NOT DETECTED
HAEMOPHILUS INFLUENZAE: NOT DETECTED
KLEBSIELLA OXYTOCA: NOT DETECTED
Klebsiella pneumoniae: NOT DETECTED
LISTERIA MONOCYTOGENES: NOT DETECTED
METHICILLIN RESISTANCE: NOT DETECTED
NEISSERIA MENINGITIDIS: NOT DETECTED
PSEUDOMONAS AERUGINOSA: NOT DETECTED
Proteus species: NOT DETECTED
STAPHYLOCOCCUS AUREUS BCID: NOT DETECTED
Serratia marcescens: NOT DETECTED
Staphylococcus species: NOT DETECTED
Streptococcus agalactiae: NOT DETECTED
Streptococcus pneumoniae: NOT DETECTED
Streptococcus pyogenes: NOT DETECTED
Streptococcus species: NOT DETECTED
Vancomycin resistance: NOT DETECTED

## 2015-11-25 LAB — GLUCOSE, CAPILLARY
GLUCOSE-CAPILLARY: 64 mg/dL — AB (ref 65–99)
GLUCOSE-CAPILLARY: 294 mg/dL — AB (ref 65–99)
Glucose-Capillary: 120 mg/dL — ABNORMAL HIGH (ref 65–99)
Glucose-Capillary: 114 mg/dL — ABNORMAL HIGH (ref 65–99)
Glucose-Capillary: 226 mg/dL — ABNORMAL HIGH (ref 65–99)
Glucose-Capillary: 251 mg/dL — ABNORMAL HIGH (ref 65–99)
Glucose-Capillary: 284 mg/dL — ABNORMAL HIGH (ref 65–99)

## 2015-11-25 LAB — CBC
HCT: 29.7 % — ABNORMAL LOW (ref 35.0–47.0)
Hemoglobin: 9.6 g/dL — ABNORMAL LOW (ref 12.0–16.0)
MCH: 26.6 pg (ref 26.0–34.0)
MCHC: 32.1 g/dL (ref 32.0–36.0)
MCV: 82.9 fL (ref 80.0–100.0)
PLATELETS: 194 10*3/uL (ref 150–440)
RBC: 3.59 MIL/uL — AB (ref 3.80–5.20)
RDW: 18.9 % — AB (ref 11.5–14.5)
WBC: 26.2 10*3/uL — ABNORMAL HIGH (ref 3.6–11.0)

## 2015-11-25 LAB — LACTIC ACID, PLASMA: Lactic Acid, Venous: 1 mmol/L (ref 0.5–2.0)

## 2015-11-25 MED ORDER — ONDANSETRON HCL 4 MG PO TABS: 4.0000 mg | ORAL_TABLET | Freq: Four times a day (QID) | ORAL | Status: DC | PRN

## 2015-11-25 MED ORDER — LOPERAMIDE HCL 2 MG PO CAPS
2.0000 mg | ORAL_CAPSULE | Freq: Two times a day (BID) | ORAL | Status: DC
  Administered 2015-11-25 – 2015-11-28 (×8): 2 mg via ORAL
  Filled 2015-11-25 (×8): qty 1

## 2015-11-25 MED ORDER — ACETAMINOPHEN 325 MG PO TABS
650.0000 mg | ORAL_TABLET | Freq: Four times a day (QID) | ORAL | Status: DC | PRN
  Administered 2015-11-25: 650 mg via ORAL
  Filled 2015-11-25: qty 2

## 2015-11-25 MED ORDER — CEFTRIAXONE SODIUM 1 G IJ SOLR: 1.0000 g | Freq: Once | INTRAMUSCULAR | Status: DC

## 2015-11-25 MED ORDER — MESALAMINE 400 MG PO CPDR
800.0000 mg | DELAYED_RELEASE_CAPSULE | Freq: Three times a day (TID) | ORAL | Status: DC
  Administered 2015-11-25 – 2015-11-28 (×10): 800 mg via ORAL
  Filled 2015-11-25 (×9): qty 2

## 2015-11-25 MED ORDER — POLYETHYLENE GLYCOL 3350 17 G PO PACK: 17.0000 g | PACK | Freq: Every day | ORAL | Status: DC | PRN

## 2015-11-25 MED ORDER — FERROUS SULFATE 325 (65 FE) MG PO TABS
325.0000 mg | ORAL_TABLET | Freq: Every day | ORAL | Status: DC
  Administered 2015-11-25 – 2015-11-27 (×4): 325 mg via ORAL
  Filled 2015-11-25 (×4): qty 1

## 2015-11-25 MED ORDER — INSULIN GLARGINE 100 UNIT/ML ~~LOC~~ SOLN
26.0000 [IU] | Freq: Every day | SUBCUTANEOUS | Status: DC
  Administered 2015-11-25: 26 [IU] via SUBCUTANEOUS
  Filled 2015-11-25: qty 0.26

## 2015-11-25 MED ORDER — PANTOPRAZOLE SODIUM 40 MG PO TBEC
40.0000 mg | DELAYED_RELEASE_TABLET | Freq: Every day | ORAL | Status: DC
Start: 2015-11-25 — End: 2015-11-28
  Administered 2015-11-25 – 2015-11-28 (×4): 40 mg via ORAL
  Filled 2015-11-25 (×4): qty 1

## 2015-11-25 MED ORDER — DEXTROSE 5 % IV SOLN
1.0000 g | INTRAVENOUS | Status: DC
  Filled 2015-11-25: qty 10

## 2015-11-25 MED ORDER — ACETAMINOPHEN 650 MG RE SUPP: 650.0000 mg | Freq: Four times a day (QID) | RECTAL | Status: DC | PRN

## 2015-11-25 MED ORDER — SODIUM CHLORIDE 0.9 % IV SOLN
1.0000 g | Freq: Two times a day (BID) | INTRAVENOUS | Status: DC
  Administered 2015-11-25 – 2015-11-27 (×4): 1 g via INTRAVENOUS
  Filled 2015-11-25 (×6): qty 1

## 2015-11-25 MED ORDER — METOPROLOL SUCCINATE ER 25 MG PO TB24
25.0000 mg | ORAL_TABLET | Freq: Every day | ORAL | Status: DC
  Administered 2015-11-25 – 2015-11-28 (×4): 25 mg via ORAL
  Filled 2015-11-25 (×4): qty 1

## 2015-11-25 MED ORDER — ENOXAPARIN SODIUM 30 MG/0.3ML ~~LOC~~ SOLN
30.0000 mg | SUBCUTANEOUS | Status: DC
  Administered 2015-11-25 – 2015-11-27 (×3): 30 mg via SUBCUTANEOUS
  Filled 2015-11-25 (×3): qty 0.3

## 2015-11-25 MED ORDER — FLEET ENEMA 7-19 GM/118ML RE ENEM: 1.0000 | ENEMA | Freq: Once | RECTAL | Status: DC | PRN

## 2015-11-25 MED ORDER — GLUCERNA SHAKE PO LIQD
237.0000 mL | Freq: Three times a day (TID) | ORAL | Status: DC
Start: 2015-11-25 — End: 2015-11-28
  Administered 2015-11-25 – 2015-11-28 (×9): 237 mL via ORAL

## 2015-11-25 MED ORDER — INSULIN GLARGINE 100 UNIT/ML SOLOSTAR PEN: 26.0000 [IU] | PEN_INJECTOR | Freq: Every day | SUBCUTANEOUS | Status: DC

## 2015-11-25 MED ORDER — ASPIRIN 81 MG PO CHEW
81.0000 mg | CHEWABLE_TABLET | Freq: Every day | ORAL | Status: DC
  Administered 2015-11-25 – 2015-11-28 (×4): 81 mg via ORAL
  Filled 2015-11-25 (×4): qty 1

## 2015-11-25 MED ORDER — MEMANTINE HCL ER 14 MG PO CP24
14.0000 mg | ORAL_CAPSULE | Freq: Every day | ORAL | Status: DC
Start: 2015-11-25 — End: 2015-11-28
  Administered 2015-11-25 – 2015-11-28 (×4): 14 mg via ORAL
  Filled 2015-11-25 (×4): qty 1

## 2015-11-25 MED ORDER — ZIPRASIDONE HCL 20 MG PO CAPS
20.0000 mg | ORAL_CAPSULE | Freq: Two times a day (BID) | ORAL | Status: DC | PRN
  Administered 2015-11-25 – 2015-11-27 (×3): 20 mg via ORAL
  Filled 2015-11-25 (×4): qty 1

## 2015-11-25 MED ORDER — INSULIN GLARGINE 100 UNIT/ML ~~LOC~~ SOLN
15.0000 [IU] | Freq: Every day | SUBCUTANEOUS | Status: DC
Start: 2015-11-26 — End: 2015-11-28
  Administered 2015-11-26 – 2015-11-28 (×3): 15 [IU] via SUBCUTANEOUS
  Filled 2015-11-25 (×3): qty 0.15

## 2015-11-25 MED ORDER — DIPHENHYDRAMINE HCL 25 MG PO CAPS
50.0000 mg | ORAL_CAPSULE | Freq: Every evening | ORAL | Status: DC | PRN
  Administered 2015-11-25: 50 mg via ORAL
  Filled 2015-11-25: qty 2

## 2015-11-25 MED ORDER — BISACODYL 5 MG PO TBEC: 5.0000 mg | DELAYED_RELEASE_TABLET | Freq: Every day | ORAL | Status: DC | PRN

## 2015-11-25 MED ORDER — LEVOTHYROXINE SODIUM 50 MCG PO TABS
25.0000 ug | ORAL_TABLET | Freq: Every day | ORAL | Status: DC
  Administered 2015-11-25 – 2015-11-28 (×4): 25 ug via ORAL
  Filled 2015-11-25 (×4): qty 1

## 2015-11-25 MED ORDER — INSULIN ASPART 100 UNIT/ML ~~LOC~~ SOLN
0.0000 [IU] | Freq: Three times a day (TID) | SUBCUTANEOUS | Status: DC
  Administered 2015-11-25: 5 [IU] via SUBCUTANEOUS
  Administered 2015-11-25: 3 [IU] via SUBCUTANEOUS
  Administered 2015-11-26: 5 [IU] via SUBCUTANEOUS
  Administered 2015-11-26: 3 [IU] via SUBCUTANEOUS
  Administered 2015-11-27: 9 [IU] via SUBCUTANEOUS
  Administered 2015-11-27: 7 [IU] via SUBCUTANEOUS
  Administered 2015-11-28: 2 [IU] via SUBCUTANEOUS
  Administered 2015-11-28: 7 [IU] via SUBCUTANEOUS
  Filled 2015-11-25: qty 9
  Filled 2015-11-25 (×2): qty 3
  Filled 2015-11-25: qty 5
  Filled 2015-11-25: qty 7
  Filled 2015-11-25: qty 2
  Filled 2015-11-25: qty 5
  Filled 2015-11-25: qty 7

## 2015-11-25 MED ORDER — LORAZEPAM 1 MG PO TABS
1.0000 mg | ORAL_TABLET | Freq: Once | ORAL | Status: AC
  Administered 2015-11-25: 1 mg via ORAL
  Filled 2015-11-25: qty 1

## 2015-11-25 MED ORDER — PREDNISONE 10 MG PO TABS
10.0000 mg | ORAL_TABLET | Freq: Every day | ORAL | Status: DC
  Administered 2015-11-25 – 2015-11-28 (×4): 10 mg via ORAL
  Filled 2015-11-25 (×4): qty 1

## 2015-11-25 MED ORDER — ONDANSETRON HCL 4 MG/2ML IJ SOLN: 4.0000 mg | Freq: Four times a day (QID) | INTRAMUSCULAR | Status: DC | PRN

## 2015-11-25 MED ORDER — CLOPIDOGREL BISULFATE 75 MG PO TABS
75.0000 mg | ORAL_TABLET | Freq: Every day | ORAL | Status: DC
  Administered 2015-11-25 – 2015-11-28 (×4): 75 mg via ORAL
  Filled 2015-11-25 (×4): qty 1

## 2015-11-25 MED ORDER — SERTRALINE HCL 50 MG PO TABS
100.0000 mg | ORAL_TABLET | Freq: Every day | ORAL | Status: DC
  Administered 2015-11-25 – 2015-11-28 (×4): 100 mg via ORAL
  Filled 2015-11-25 (×4): qty 2

## 2015-11-25 MED ORDER — INSULIN ASPART 100 UNIT/ML ~~LOC~~ SOLN
0.0000 [IU] | Freq: Every day | SUBCUTANEOUS | Status: DC
  Administered 2015-11-25 – 2015-11-27 (×3): 3 [IU] via SUBCUTANEOUS
  Filled 2015-11-25 (×3): qty 3

## 2015-11-25 MED ORDER — POTASSIUM CHLORIDE IN NACL 20-0.9 MEQ/L-% IV SOLN
INTRAVENOUS | Status: AC
  Administered 2015-11-25: 03:00:00 via INTRAVENOUS
  Filled 2015-11-25 (×3): qty 1000

## 2015-11-25 MED ORDER — DICLOFENAC SODIUM 1 % TD GEL
2.0000 g | Freq: Four times a day (QID) | TRANSDERMAL | Status: DC | PRN
  Filled 2015-11-25: qty 100

## 2015-11-25 MED ORDER — QUETIAPINE FUMARATE 25 MG PO TABS
25.0000 mg | ORAL_TABLET | Freq: Every day | ORAL | Status: DC
  Filled 2015-11-25: qty 1

## 2015-11-25 MED ORDER — PRAVASTATIN SODIUM 20 MG PO TABS
80.0000 mg | ORAL_TABLET | Freq: Every day | ORAL | Status: DC
  Administered 2015-11-25 – 2015-11-27 (×4): 80 mg via ORAL
  Filled 2015-11-25 (×4): qty 4

## 2015-11-25 NOTE — Progress Notes (Signed)
Pts CBG when checked. Followed hypoglycemia protocol. Pt given 4 oz of milk and rechecked after 15 minutes. Pts CBG was then 120.

## 2015-11-25 NOTE — Progress Notes (Signed)
Dr Juanna Cao notified about familys concerns about seroquel, orders given.

## 2015-11-25 NOTE — Progress Notes (Signed)
Pharmacy Antibiotic Note  Bethany Russell is a 75 y.o. female admitted on 11/24/2015 with UTI.  Patient was initially started on Ceftriaxone but BCID results are showing in E.coli in the blood. Pharmacy has been consulted for Meropenem dosing.  Plan: BCID results were discussed with Dr. Vianne Bulls and the patient's antibiotics will be changed to Meropenem. Further narrowing will be determined based on finalized susceptibilities.  Height: 5\' 4"  (162.6 cm) Weight: 126 lb 11.2 oz (57.471 kg) IBW/kg (Calculated) : 54.7  Temp (24hrs), Avg:98.5 F (36.9 C), Min:98.1 F (36.7 C), Max:99.3 F (37.4 C)   Recent Labs Lab 11/22/15 1502 11/24/15 2208 11/24/15 2308 11/25/15 0230  WBC 17.4* 33.0*  --  26.2*  CREATININE 1.41* 1.80*  --  1.75*  LATICACIDVEN  --   --  1.1 1.0    Estimated Creatinine Clearance: 23.6 mL/min (by C-G formula based on Cr of 1.75).    No Known Allergies  Antimicrobials this admission: Ceftriaxone 2/24 >> 2/24 meropenem 2/24 >>   Dose adjustments this admission:   Microbiology results: 2/24 BCx: BCID shows E.coli 2/24 UCx: pending   Thank you for allowing pharmacy to be a part of this patient's care.  Paulina Fusi, PharmD, BCPS 11/25/2015 2:59 PM

## 2015-11-25 NOTE — Progress Notes (Signed)
Patient  Family request not to have bed alarm on since she is in the room.

## 2015-11-25 NOTE — Progress Notes (Signed)
Inpatient Diabetes Program Recommendations  AACE/ADA: New Consensus Statement on Inpatient Glycemic Control (2015)  Target Ranges:  Prepandial:   less than 140 mg/dL      Peak postprandial:   less than 180 mg/dL (1-2 hours)      Critically ill patients:  140 - 180 mg/dL   Review of Glycemic Control  Results for MARGRETTA, GOUCHER (MRN [id number]) as of 11/25/2015 10:12  Ref. Range 11/25/2015 01:57 11/25/2015 03:21 11/25/2015 07:53  Glucose-Capillary Latest Ref Range: 65-99 mg/dL 64 (L) 120 (H) 114 (H)    Diabetes history: Type 2 Outpatient Diabetes medications: Lantus 26 units qday, Novolog 8-16 units tid Current orders for Inpatient glycemic control: Lantus 26 units qday, Novolog 0-9 units tid, Novolog 0-5 units qhs  Inpatient Diabetes Program Recommendations: Patient was started on Prednisone 10mg /day. Will follow- may need to decrease Lantus dose.   Gentry Fitz, RN, BA, MHA, CDE Diabetes Coordinator Inpatient Diabetes Program  [phone] (Team Pager) [phone] ([place]) 11/25/2015 10:19 AM

## 2015-11-25 NOTE — Progress Notes (Signed)
[place] [facility] [place] NAME: Bethany Russell    MR#:  [id number]  DATE OF BIRTH:  Feb 13, 1941  SUBJECTIVE: Patient admitted for fever and the UTI. Her severe baseline dementia. Patient has no further fever ,started on IV antibiotics. WBC is also down.   CHIEF COMPLAINT:   Chief Complaint  Patient presents with  . Fever    REVIEW OF SYSTEMS:    Review of Systems  Unable to perform ROS: dementia    Nutrition:  Tolerating Diet: Tolerating PT:      DRUG ALLERGIES:  No Known Allergies  VITALS:  Blood pressure 119/54, pulse 93, temperature 99.3 F (37.4 C), temperature source Oral, resp. rate 17, height 5\' 4"  (1.626 m), weight 57.471 kg (126 lb 11.2 oz), SpO2 95 %.  PHYSICAL EXAMINATION:   Physical Exam  GENERAL:  75 y.o.-year-old patient lying in the bed with no acute distress.  EYES: Pupils equal, round, reactive to light and accommodation. No scleral icterus. Extraocular muscles intact.  HEENT: Head atraumatic, normocephalic. Oropharynx and nasopharynx clear.  NECK:  Supple, no jugular venous distention. No thyroid enlargement, no tenderness.  LUNGS: Normal breath sounds bilaterally, no wheezing, rales,rhonchi or crepitation. No use of accessory muscles of respiration.  CARDIOVASCULAR: S1, S2 normal. No murmurs, rubs, or gallops.  ABDOMEN: Soft, nontender, nondistended. Bowel sounds present. No organomegaly or mass.  EXTREMITIES: No pedal edema, cyanosis, or clubbing.  NEUROLOGIC: Cranial nerves II through XII are intact. Muscle strength 5/5 in all extremities. Sensation intact. Gait not checked.  PSYCHIATRIC: Awake but confused. SKIN: No obvious rash, lesion, or ulcer.    LABORATORY PANEL:   CBC  Recent Labs Lab 11/25/15 0230  WBC 26.2*  HGB 9.6*  HCT 29.7*  PLT 194   ------------------------------------------------------------------------------------------------------------------  Chemistries   Recent  Labs Lab 11/24/15 2208 11/25/15 0230  NA 133* 132*  K 3.5 3.3*  CL 105 106  CO2 17* 19*  GLUCOSE 55* 109*  BUN 37* 34*  CREATININE 1.80* 1.75*  CALCIUM 8.5* 7.1*  AST 28  --   ALT 25  --   ALKPHOS 91  --   BILITOT 0.3  --    ------------------------------------------------------------------------------------------------------------------  Cardiac Enzymes No results for input(s): TROPONINI in the last 168 hours. ------------------------------------------------------------------------------------------------------------------  RADIOLOGY:  Dg Chest 2 View  11/24/2015  CLINICAL DATA:  Fever, onset 2 days prior. EXAM: CHEST  2 VIEW COMPARISON:  05/26/2015 FINDINGS: Patient is post median sternotomy. The cardiomediastinal contours are normal. Posterior basilar opacities on the lateral view favoring atelectasis. Pulmonary vasculature is normal. No confluent airspace disease, pleural effusion, or pneumothorax. No acute osseous abnormalities are seen. IMPRESSION: Basilar atelectasis.  No evidence of pneumonia. Electronically Signed   By: Jeb Levering M.D.   On: 11/24/2015 22:36   US Renal  11/25/2015  CLINICAL DATA:  Acute renal failure EXAM: RENAL / URINARY TRACT ULTRASOUND COMPLETE COMPARISON:  CT abdomen pelvis - 05/31/2015; 10/07/2013 FINDINGS: Right Kidney: Normal cortical thickness, echogenicity and size, measuring 11.5 cm in length. No focal renal lesions. No echogenic renal stones. No urinary obstruction. Left Kidney: Normal cortical thickness, echogenicity and size, measuring 10.9 cm in length. No focal renal lesions. No echogenic renal stones. No urinary obstruction. Bladder: Minimal amount of mixed echogenic debris is noted lying dependently within the urinary bladder (image 36). Otherwise, normal appearance of the urinary bladder given degree distention. IMPRESSION: 1. No explanation for patient's acute renal failure. Specifically, no evidence of urinary obstruction or morphologic  evidence of medical renal disease. 2. Minimal amount of debris noted lying dependently within otherwise normal-appearing urinary bladder. Correlation with urinalysis is recommended. Electronically Signed   By: Sandi Mariscal M.D.   On: 11/25/2015 09:06     ASSESSMENT AND PLAN:   Active Problems:   UTI (lower urinary tract infection)   29.75 year old female patient with the fever due to UTI: Continue IV Rocephin, follow culture data. WBC is down from 33-22. #2. Acute renal failure with ATN due to sepsis from UTI: Continue IV hydration and monitor renal function.  #3 diabetes mellitus type 2: Patient has (the bed glucose, decreased the Lantus dose. #4. Severe dementia: Continue Zoloft, Ativan as needed. #5 history of ulcerative colitis; patient is on Humira. Mesalamine., Prednisone. #6 hypertension controlled continue metoprolol XL 25 mg daily #7 hypothyroidism ;continue Synthroid. #8 history of coronary artery disease: Continue aspirin, Plavix,  9.hypokalemia, hyponatremia: Continue IV hydration with potassium. Discussed with the patient's daughter. All the records are reviewed and case discussed with Care Management/Social Workerr. Management plans discussed with the patient, family and they are in agreement.  CODE STATUS: DO NOT RESUSCITATE  TOTAL TIME TAKING CARE OF THIS PATIENT: 25 minutes.   POSSIBLE D/C IN 1-2 DAYS, DEPENDING ON CLINICAL CONDITION.   Epifanio Lesches M.D on 11/25/2015 at 10:43 AM  Between 7am to 6pm - Pager - [phone]  After 6pm go to [email or web address] - password [email or web address] [facility]  [place] [facility]  Office  [phone]  CC: Primary care physician; Sheral Flow, NP

## 2015-11-25 NOTE — Care Management Note (Signed)
Case Management Note  Patient Details  Name: Bethany Russell MRN: [id number] Date of Birth: [birth year]  Subjective/Objective:        Patient admitted with UTI.  Hx of dementia.  Daughter at bedside.  Patient lives at home with her daughter [name]. Has a cane and walker in the home.  Obtains medications from [facility] in [place].  Patient has never had home health services.  One stay at rehab in 2011.  Patient has a sitter that comes 5 days a week while her daughter is at work.  Daughter states that patient is impulsive, and has increased weakness.  PT consult pending   Action/Plan:   Expected Discharge Date:  11/26/15               Expected Discharge Plan:     In-House Referral:     Discharge planning Services     Post Acute Care Choice:    Choice offered to:     DME Arranged:    DME Agency:     HH Arranged:    [place] Agency:     Status of Service:     Medicare Important Message Given:    Date Medicare IM Given:    Medicare IM give by:    Date Additional Medicare IM Given:    Additional Medicare Important Message give by:     If discussed at [place] of Stay Meetings, dates discussed:    Additional Comments:  Beverly Sessions, RN 11/25/2015, 2:59 PM

## 2015-11-25 NOTE — H&P (Signed)
[facility] [place] NAME: Bethany Russell    MR#:  [id number]  DATE OF BIRTH:  1941/06/08  DATE OF ADMISSION:  11/24/2015  PRIMARY CARE PHYSICIAN: Sheral Flow, NP   REQUESTING/REFERRING PHYSICIAN: Dr. Joni Fears  CHIEF COMPLAINT:   Chief Complaint  Patient presents with  . Fever    HISTORY OF PRESENT ILLNESS:  Bethany Russell  is a 75 y.o. female with a known history of hypertension, diabetes, ulcerative colitis on chronic prednisone presents to the emergency room brought in by the daughter due to fevers for 3 days. Highest fever recorded home was 103. She saw her doctor yesterday was found to have elevated WBC of 17,000 and was given Tylenol. Today her white blood cell count is elevated up to 33,000 along with worsening creatinine and UTI on the urinalysis. Patient has been doing well otherwise per daughter. Patient has dementia and is a poor historian. History obtained from old records and daughter at bedside. She had cough for 3 days back without any sputum which is resolved.  PAST MEDICAL HISTORY:   Past Medical History  Diagnosis Date  . Ulcerative colitis ([place])   . Hypertension   . Diabetes mellitus without complication ([place])   . Cancer ([place])     skin cancer  . Coronary artery disease   . GI bleed   . Urinary tract infection   . Vitamin D deficiency   . Dementia   . Diabetes mellitus, type II ([place])   . Thyroid disease     PAST SURGICAL HISTORY:   Past Surgical History  Procedure Laterality Date  . Coronary artery bypass graft    . Skin biopsy    . Flexible sigmoidoscopy N/A 06/08/2015    Procedure: FLEXIBLE SIGMOIDOSCOPY;  Surgeon: Manya Silvas, MD;  Location: [facility] ENDOSCOPY;  Service: Endoscopy;  Laterality: N/A;  . Colonoscopy N/A 06/09/2015    Procedure: COLONOSCOPY;  Surgeon: Manya Silvas, MD;  Location: [facility] ENDOSCOPY;  Service: Endoscopy;  Laterality: N/A;    SOCIAL HISTORY:   Social  History  Substance Use Topics  . Smoking status: Never Smoker   . Smokeless tobacco: Not on file  . Alcohol Use: No    FAMILY HISTORY:   Family History  Problem Relation Age of Onset  . CAD Mother   . CAD Father   . Colon cancer Brother     DRUG ALLERGIES:  No Known Allergies  REVIEW OF SYSTEMS:   Review of Systems  Unable to perform ROS: dementia   MEDICATIONS AT HOME:   Prior to Admission medications   Medication Sig Start Date End Date Taking? Authorizing Provider  alendronate (FOSAMAX) 70 MG tablet Take 1 tablet (70 mg total) by mouth once a week. 10/31/15  Yes Pleas Koch, NP  aspirin 81 MG tablet Take 81 mg by mouth daily.   Yes Historical Provider, MD  clopidogrel (PLAVIX) 75 MG tablet Take 75 mg by mouth daily.   Yes Historical Provider, MD  diclofenac sodium (VOLTAREN) 1 % GEL Apply 2 g topically 4 (four) times daily as needed (for pain).    Yes Historical Provider, MD  feeding supplement, GLUCERNA SHAKE, (GLUCERNA SHAKE) LIQD Take 237 mLs by mouth 3 (three) times daily with meals. 06/07/15  Yes Theodoro Grist, MD  ferrous sulfate 325 (65 FE) MG tablet Take 325 mg by mouth at bedtime.    Yes Historical Provider, MD  HUMIRA PEN 40 MG/0.8ML PNKT Inject 40 mg into  the skin every 14 (fourteen) days. Pt uses on Tuesday. 11/22/15  Yes Pleas Koch, NP  Insulin Glargine (LANTUS SOLOSTAR) 100 UNIT/ML Solostar Pen Inject 26 Units into the skin daily. 11/24/15  Yes Pleas Koch, NP  insulin lispro (HUMALOG KWIKPEN) 100 UNIT/ML KiwkPen Inject 8-16 units three times daily based off blood sugar readings. 11/24/15  Yes Pleas Koch, NP  levothyroxine (SYNTHROID, LEVOTHROID) 25 MCG tablet Take 25 mcg by mouth daily.    Yes Historical Provider, MD  loperamide (IMODIUM) 2 MG capsule Take 2 mg by mouth 2 (two) times daily.    Yes Historical Provider, MD  Melatonin 3 MG TABS Take 3 mg by mouth at bedtime.    Yes Historical Provider, MD  memantine (NAMENDA XR) 14 MG CP24 24  hr capsule Take 1 capsule (14 mg total) by mouth daily. 11/22/15  Yes [name], MD  Mesalamine (ASACOL HD) 800 MG TBEC Take 800 mg by mouth 3 (three) times daily.    Yes Historical Provider, MD  metoprolol succinate (TOPROL-XL) 25 MG 24 hr tablet Take 1 tablet (25 mg total) by mouth daily. 10/10/15  Yes Pleas Koch, NP  pantoprazole (PROTONIX) 40 MG tablet Take 40 mg by mouth daily.    Yes Historical Provider, MD  pravastatin (PRAVACHOL) 80 MG tablet Take 1 tablet (80 mg total) by mouth at bedtime. 10/31/15  Yes Pleas Koch, NP  predniSONE (DELTASONE) 10 MG tablet Take 5 tablets (50 mg total) by mouth daily with breakfast. Patient taking differently: Take by mouth daily with breakfast.  06/07/15  Yes Theodoro Grist, MD  sertraline (ZOLOFT) 100 MG tablet Take 1 tablet (100 mg total) by mouth daily. 11/21/15  Yes Pleas Koch, NP  Vitamin D, Ergocalciferol, (DRISDOL) 50000 UNITS CAPS capsule Take 50,000 Units by mouth once a week. Pt takes on Sunday.   Yes Historical Provider, MD     VITAL SIGNS:  Blood pressure 104/83, pulse 108, temperature 98.3 F (36.8 C), temperature source Oral, resp. rate 16, height 5\' 4"  (1.626 m), weight 57.788 kg (127 lb 6.4 oz), SpO2 94 %.  PHYSICAL EXAMINATION:  Physical Exam  GENERAL:  75 y.o.-year-old patient lying in the bed with no acute distress.  EYES: Pupils equal, round, reactive to light and accommodation. No scleral icterus. Extraocular muscles intact.  HEENT: Head atraumatic, normocephalic. Oropharynx and nasopharynx clear. No oropharyngeal erythema, moist oral mucosa  NECK:  Supple, no jugular venous distention. No thyroid enlargement, no tenderness.  LUNGS: Normal breath sounds bilaterally, no wheezing, rales, rhonchi. No use of accessory muscles of respiration. CARDIOVASCULAR: S1, S2 normal. No murmurs, rubs, or gallops.  ABDOMEN: Soft, nontender, nondistended. Bowel sounds present. No organomegaly or mass.  EXTREMITIES: No pedal edema,  cyanosis, or clubbing. + 2 pedal & radial pulses b/l.   NEUROLOGIC: Cranial nerves II through XII are intact. No focal Motor or sensory deficits appreciated b/l PSYCHIATRIC: The patient is alert and awake. Presently confused. SKIN: No obvious rash, lesion, or ulcer.  LABORATORY PANEL:   CBC  Recent Labs Lab 11/24/15 2208  WBC 33.0*  HGB 11.0*  HCT 34.6*  PLT 236   ------------------------------------------------------------------------------------------------------------------  Chemistries   Recent Labs Lab 11/24/15 2208  NA 133*  K 3.5  CL 105  CO2 17*  GLUCOSE 55*  BUN 37*  CREATININE 1.80*  CALCIUM 8.5*  AST 28  ALT 25  ALKPHOS 91  BILITOT 0.3   ------------------------------------------------------------------------------------------------------------------  Cardiac Enzymes No results for input(s): TROPONINI in the  last 168 hours. ------------------------------------------------------------------------------------------------------------------ RADIOLOGY:  Dg Chest 2 View  11/24/2015  CLINICAL DATA:  Fever, onset 2 days prior. EXAM: CHEST  2 VIEW COMPARISON:  05/26/2015 FINDINGS: Patient is post median sternotomy. The cardiomediastinal contours are normal. Posterior basilar opacities on the lateral view favoring atelectasis. Pulmonary vasculature is normal. No confluent airspace disease, pleural effusion, or pneumothorax. No acute osseous abnormalities are seen. IMPRESSION: Basilar atelectasis.  No evidence of pneumonia. Electronically Signed   By: Jeb Levering M.D.   On: 11/24/2015 22:36   IMPRESSION AND PLAN:   * UTI with sepsis. Tachycardia and leukocytosis IV fluids. IV ceftriaxone daily Await culture results  * Acute renal failure over CKD stage III Should improve with IV fluids. Labs in the morning  * IDDM Continue Lantus. Sliding scale insulin. Diabetic diet  * Hypertension Continue metoprolol.  * Ulcerative colitis Continue daily  prednisone 10 mg. Patient is on Humira every 2 weeks.  * DVT prophylaxis with Lovenox  All the records are reviewed and case discussed with ED provider. Management plans discussed with the patient, family and they are in agreement.  CODE STATUS: FULL CODE  TOTAL TIME TAKING CARE OF THIS PATIENT: 40 minutes.   Hillary Bow R M.D on 11/25/2015 at 12:38 AM  Between 7am to 6pm - Pager - [phone]  After 6pm go to [email or web address] - password [email or web address] [facility]  Office  [phone]  CC: Primary care physician; Sheral Flow, NP  Note: This dictation was prepared with Dragon dictation along with smaller phrase technology. Any transcriptional errors that result from this process are unintentional.

## 2015-11-25 NOTE — Evaluation (Signed)
Physical Therapy Evaluation Patient Details Name: Bethany Russell MRN: [id number] DOB: [birth year] Today's Date: 11/25/2015   History of Present Illness  Pt admitted for UTI. Pt with history of dementia and multiple hospitalizations. Pt currently confused at this time, only oriented to self  Clinical Impression  Pt is a pleasantly confused 75 year old female who was admitted for UTI. Pt performs bed mobility/transfers with min assist and is unsafe to ambulate at this time secondary to confusion/impulsive nature. Pt able to follow commands for all mobility in bed. Pt demonstrates deficits with strength/balance/mobility. Would benefit from skilled PT to address above deficits and promote optimal return to PLOF; recommend transition to STR upon discharge from acute hospitalization. Will eventually need transition to LTC.       Follow Up Recommendations SNF    Equipment Recommendations       Recommendations for Other Services       Precautions / Restrictions Precautions Precautions: Fall Restrictions Weight Bearing Restrictions: No      Mobility  Bed Mobility Overal bed mobility: Needs Assistance Bed Mobility: Supine to Sit     Supine to sit: Min assist     General bed mobility comments: assist for mobility. Therapist gave hand for assistance. Once seated at EOB, pt able to sit with cga. Pt slightly impulsive and tries to stand prior to therapist direction.  Transfers Overall transfer level: Needs assistance Equipment used: None Transfers: Sit to/from Stand Sit to Stand: Min assist         General transfer comment: assist for transfers. Pt impulsive and demonstrates decreased safety awareness. Once standing, pt reaching for surfaces to balance.  Ambulation/Gait             General Gait Details: unsafe for ambulation at this time.   Stairs            Wheelchair Mobility    Modified Rankin (Stroke Patients Only)       Balance Overall balance  assessment: Needs assistance Sitting-balance support: Feet unsupported Sitting balance-Leahy Scale: Fair     Standing balance support: Single extremity supported Standing balance-Leahy Scale: Fair                               Pertinent Vitals/Pain Pain Assessment: No/denies pain    Home Living Family/patient expects to be discharged to:: Private residence Living Arrangements: Children (lives with daughter) Available Help at Discharge: Available 24 hours/day;Personal care attendant;Family (has sitter during day while daughter works) Type of Home: House Home Access: Level entry     [place]: One [place]: Environmental consultant - 2 wheels;Cane - single point;Hospital bed;Bedside commode;Shower seat      Prior Function Level of Independence: Independent with assistive device(s)         Comments: Pt currently ambulating with rw in home     Hand Dominance        Extremity/Trunk Assessment   Upper Extremity Assessment: Generalized weakness (grossly 4+/5)           Lower Extremity Assessment: Generalized weakness (grossly 3+/5)         Communication   Communication: No difficulties  Cognition Arousal/Alertness: Awake/alert Behavior During Therapy: WFL for tasks assessed/performed Overall Cognitive Status: History of cognitive impairments - at baseline                      General Comments      Exercises  Assessment/Plan    PT Assessment Patient needs continued PT services  PT Diagnosis Difficulty walking;Generalized weakness   PT Problem List Decreased strength;Decreased balance;Decreased mobility  PT Treatment Interventions Gait training;Therapeutic activities;Therapeutic exercise;DME instruction   PT Goals (Current goals can be found in the Care Plan section) Acute Rehab PT Goals Patient Stated Goal: unable to state goals PT Goal Formulation: With family Time For Goal Achievement: 12/09/15 Potential to Achieve Goals:  Good    Frequency Min 2X/week   Barriers to discharge        Co-evaluation               End of Session   Activity Tolerance: Patient tolerated treatment well Patient left: in bed;with family/visitor present (family requesting no alarm) Nurse Communication: Mobility status         Time: 1638-1700 PT Time Calculation (min) (ACUTE ONLY): 22 min   Charges:   PT Evaluation $PT Eval Moderate Complexity: 1 Procedure     PT G Codes:        Kristiane Morsch 12/05/15, 5:11 PM  Greggory Stallion, PT, DPT [phone]

## 2015-11-25 NOTE — Progress Notes (Signed)
Patient was restless and anxious all day, ativan administered per order but not effective. It made the patient worse. So  MD ordered geodon and seroquel. Patient seemed to be  slightly calm after Geodon administration.  Will continue to monitor.

## 2015-11-26 LAB — BASIC METABOLIC PANEL WITH GFR
Anion gap: 5 (ref 5–15)
BUN: 30 mg/dL — ABNORMAL HIGH (ref 6–20)
CO2: 19 mmol/L — ABNORMAL LOW (ref 22–32)
Calcium: 7.9 mg/dL — ABNORMAL LOW (ref 8.9–10.3)
Chloride: 116 mmol/L — ABNORMAL HIGH (ref 101–111)
Creatinine, Ser: 1.67 mg/dL — ABNORMAL HIGH (ref 0.44–1.00)
GFR calc Af Amer: 33 mL/min — ABNORMAL LOW
GFR calc non Af Amer: 29 mL/min — ABNORMAL LOW
Glucose, Bld: 202 mg/dL — ABNORMAL HIGH (ref 65–99)
Potassium: 4.1 mmol/L (ref 3.5–5.1)
Sodium: 140 mmol/L (ref 135–145)

## 2015-11-26 LAB — CBC WITH DIFFERENTIAL/PLATELET
Basophils Absolute: 0.1 10*3/uL (ref 0–0.1)
Basophils Relative: 1 %
Eosinophils Absolute: 0.1 10*3/uL (ref 0–0.7)
Eosinophils Relative: 0 %
HCT: 32.6 % — ABNORMAL LOW (ref 35.0–47.0)
Hemoglobin: 10.6 g/dL — ABNORMAL LOW (ref 12.0–16.0)
Lymphocytes Relative: 8 %
Lymphs Abs: 1.3 10*3/uL (ref 1.0–3.6)
MCH: 26.9 pg (ref 26.0–34.0)
MCHC: 32.5 g/dL (ref 32.0–36.0)
MCV: 82.8 fL (ref 80.0–100.0)
Monocytes Absolute: 1.6 10*3/uL — ABNORMAL HIGH (ref 0.2–0.9)
Monocytes Relative: 9 %
Neutro Abs: 14.5 10*3/uL — ABNORMAL HIGH (ref 1.4–6.5)
Neutrophils Relative %: 82 %
Platelets: 203 10*3/uL (ref 150–440)
RBC: 3.94 MIL/uL (ref 3.80–5.20)
RDW: 19 % — ABNORMAL HIGH (ref 11.5–14.5)
WBC: 17.6 10*3/uL — ABNORMAL HIGH (ref 3.6–11.0)

## 2015-11-26 LAB — GLUCOSE, CAPILLARY
GLUCOSE-CAPILLARY: 92 mg/dL (ref 65–99)
GLUCOSE-CAPILLARY: 237 mg/dL — AB (ref 65–99)
Glucose-Capillary: 213 mg/dL — ABNORMAL HIGH (ref 65–99)
Glucose-Capillary: 253 mg/dL — ABNORMAL HIGH (ref 65–99)

## 2015-11-26 MED ORDER — HALOPERIDOL LACTATE 5 MG/ML IJ SOLN
2.0000 mg | Freq: Once | INTRAMUSCULAR | Status: AC
  Administered 2015-11-26: 2 mg via INTRAVENOUS
  Filled 2015-11-26: qty 1

## 2015-11-26 NOTE — Progress Notes (Signed)
Dr Juanna Cao notified pt combative , agitated, orders given.

## 2015-11-26 NOTE — Progress Notes (Signed)
[facility] at [facility]                                                                                                                                                                                            Patient Demographics   Bethany Russell, is a 75 y.o. female, DOB - 07/10/1941, [id number]  Admit date - 11/24/2015   Admitting Physician Hillary Bow, MD  Outpatient Primary MD for the patient is Sheral Flow, NP   LOS - 1  Subjective: Patient was very agitated yesterday according to the daughter was at bedside. Had to receive a dose of Geodon. She slept well overnight. She has baseline dementia and unable to provide any review of systems.     Review of Systems:   CONSTITUTIONAL: Unable to provide due to her advanced dementia  Vitals:   Filed Vitals:   11/25/15 1300 11/25/15 2101 11/26/15 0100 11/26/15 0603  BP: 106/68 138/61  163/90  Pulse: 92 80  106  Temp: 98.2 F (36.8 C) 98.3 F (36.8 C)  98 F (36.7 C)  TempSrc: Oral Oral  Oral  Resp: 18 20  24   Height:      Weight:   62.914 kg (138 lb 11.2 oz)   SpO2: 96% 98%  93%    Wt Readings from Last 3 Encounters:  11/26/15 62.914 kg (138 lb 11.2 oz)  11/22/15 60.873 kg (134 lb 3.2 oz)  09/20/15 61.689 kg (136 lb)     Intake/Output Summary (Last 24 hours) at 11/26/15 1001 Last data filed at 11/26/15 0000  Gross per 24 hour  Intake   2100 ml  Output   2400 ml  Net   -300 ml    Physical Exam:   GENERAL: Pleasant-appearing in no apparent distress.  HEAD, EYES, EARS, NOSE AND THROAT: Atraumatic, normocephalic. Extraocular muscles are intact. Pupils equal and reactive to light. Sclerae anicteric. No conjunctival injection. No oro-pharyngeal erythema.  NECK: Supple. There is no jugular venous distention. No bruits, no lymphadenopathy, no thyromegaly.  HEART: Regular rate and rhythm,. No murmurs, no rubs, no clicks.  LUNGS: Clear to auscultation bilaterally. No  rales or rhonchi. No wheezes.  ABDOMEN: Soft, flat, nontender, nondistended. Has good bowel sounds. No hepatosplenomegaly appreciated.  EXTREMITIES: No evidence of any cyanosis, clubbing, or peripheral edema.  +2 pedal and radial pulses bilaterally.  NEUROLOGIC: The patient is alert, awake, not oriented to place or time only to person  SKIN: Moist and warm with no rashes appreciated.  Psych: Not anxious, depressed LN: No inguinal LN enlargement    Antibiotics   Anti-infectives  Start     Dose/Rate Route Frequency Ordered Stop   11/26/15 1800  cefTRIAXone (ROCEPHIN) 1 g in dextrose 5 % 50 mL IVPB  Status:  Discontinued     1 g 100 mL/hr over 30 Minutes Intravenous  Once 11/25/15 0031 11/25/15 1028   11/25/15 1800  cefTRIAXone (ROCEPHIN) 1 g in dextrose 5 % 50 mL IVPB  Status:  Discontinued     1 g 100 mL/hr over 30 Minutes Intravenous Every 24 hours 11/25/15 1028 11/25/15 1331   11/25/15 1400  meropenem (MERREM) 1 g in sodium chloride 0.9 % 100 mL IVPB     1 g 200 mL/hr over 30 Minutes Intravenous Every 12 hours 11/25/15 1331     11/24/15 2330  cefTRIAXone (ROCEPHIN) 1 g in dextrose 5 % 50 mL IVPB     1 g 100 mL/hr over 30 Minutes Intravenous  Once 11/24/15 2328 11/25/15 0024      Medications   Scheduled Meds: . aspirin  81 mg Oral Daily  . clopidogrel  75 mg Oral Daily  . enoxaparin (LOVENOX) injection  30 mg Subcutaneous Q24H  . feeding supplement (GLUCERNA SHAKE)  237 mL Oral TID WC  . ferrous sulfate  325 mg Oral QHS  . insulin aspart  0-5 Units Subcutaneous QHS  . insulin aspart  0-9 Units Subcutaneous TID WC  . insulin glargine  15 Units Subcutaneous Daily  . levothyroxine  25 mcg Oral QAC breakfast  . loperamide  2 mg Oral BID  . memantine  14 mg Oral Daily  . meropenem (MERREM) IV  1 g Intravenous Q12H  . Mesalamine  800 mg Oral TID  . metoprolol succinate  25 mg Oral Daily  . pantoprazole  40 mg Oral Daily  . pravastatin  80 mg Oral QHS  . predniSONE  10 mg  Oral Q breakfast  . sertraline  100 mg Oral Daily   Continuous Infusions:  PRN Meds:.acetaminophen **OR** acetaminophen, bisacodyl, diclofenac sodium, diphenhydrAMINE, ondansetron **OR** ondansetron (ZOFRAN) IV, polyethylene glycol, sodium phosphate, ziprasidone   Data Review:   Micro Results Recent Results (from the past 240 hour(s))  Culture, blood (routine x 2)     Status: None (Preliminary result)   Collection Time: 11/24/15 10:08 PM  Result Value Ref Range Status   Specimen Description BLOOD LEFT ASSIST CONTROL  Final   Special Requests   Final    BOTTLES DRAWN AEROBIC AND ANAEROBIC 11CCAERO,3CCANA   Culture  Setup Time   Final    GRAM NEGATIVE RODS IN BOTH AEROBIC AND ANAEROBIC BOTTLES CRITICAL RESULT CALLED TO, READ BACK BY AND VERIFIED WITH: CHRISTINE KATSOUDAS AT [id number] ON 11/25/15. CTJ    Culture   Final    ESCHERICHIA COLI IN BOTH AEROBIC AND ANAEROBIC BOTTLES SUSCEPTIBILITIES TO FOLLOW    Report Status PENDING  Incomplete  Blood Culture ID Panel (Reflexed)     Status: Abnormal   Collection Time: 11/24/15 10:08 PM  Result Value Ref Range Status   Enterococcus species NOT DETECTED NOT DETECTED Final   Vancomycin resistance NOT DETECTED NOT DETECTED Final   Listeria monocytogenes NOT DETECTED NOT DETECTED Final   Staphylococcus species NOT DETECTED NOT DETECTED Final   Staphylococcus aureus NOT DETECTED NOT DETECTED Final   Methicillin resistance NOT DETECTED NOT DETECTED Final   Streptococcus species NOT DETECTED NOT DETECTED Final   Streptococcus agalactiae NOT DETECTED NOT DETECTED Final   Streptococcus pneumoniae NOT DETECTED NOT DETECTED Final   Streptococcus pyogenes NOT DETECTED NOT DETECTED Final  Acinetobacter baumannii NOT DETECTED NOT DETECTED Final   Enterobacteriaceae species DETECTED (A) NOT DETECTED Final    Comment: CRITICAL RESULT CALLED TO, READ BACK BY AND VERIFIED WITH: [place] FOR ENTEROBACTERIACEAE AND E. COLI AT 1314 ON 11/25/15.  CTJ    Enterobacter cloacae complex NOT DETECTED NOT DETECTED Final   Escherichia coli DETECTED (A) NOT DETECTED Final   Klebsiella oxytoca NOT DETECTED NOT DETECTED Final   Klebsiella pneumoniae NOT DETECTED NOT DETECTED Final   Proteus species NOT DETECTED NOT DETECTED Final   Serratia marcescens NOT DETECTED NOT DETECTED Final   Carbapenem resistance NOT DETECTED NOT DETECTED Final   Haemophilus influenzae NOT DETECTED NOT DETECTED Final   Neisseria meningitidis NOT DETECTED NOT DETECTED Final   Pseudomonas aeruginosa NOT DETECTED NOT DETECTED Final   Candida albicans NOT DETECTED NOT DETECTED Final   Candida glabrata NOT DETECTED NOT DETECTED Final   Candida krusei NOT DETECTED NOT DETECTED Final   Candida parapsilosis NOT DETECTED NOT DETECTED Final   Candida tropicalis NOT DETECTED NOT DETECTED Final  Culture, blood (routine x 2)     Status: None (Preliminary result)   Collection Time: 11/24/15 11:08 PM  Result Value Ref Range Status   Specimen Description BLOOD RIGHT FOREARM  Final   Special Requests BOTTLES DRAWN AEROBIC AND ANAEROBIC 2CCAERO,2CCANA  Final   Culture  Setup Time   Final    GRAM NEGATIVE RODS IN BOTH AEROBIC AND ANAEROBIC BOTTLES CRITICAL VALUE NOTED.  VALUE IS CONSISTENT WITH PREVIOUSLY REPORTED AND CALLED VALUE.    Culture   Final    ESCHERICHIA COLI IN BOTH AEROBIC AND ANAEROBIC BOTTLES SUSCEPTIBILITIES TO FOLLOW    Report Status PENDING  Incomplete    Radiology Reports Dg Chest 2 View  11/24/2015  CLINICAL DATA:  Fever, onset 2 days prior. EXAM: CHEST  2 VIEW COMPARISON:  05/26/2015 FINDINGS: Patient is post median sternotomy. The cardiomediastinal contours are normal. Posterior basilar opacities on the lateral view favoring atelectasis. Pulmonary vasculature is normal. No confluent airspace disease, pleural effusion, or pneumothorax. No acute osseous abnormalities are seen. IMPRESSION: Basilar atelectasis.  No evidence of pneumonia. Electronically  Signed   By: Jeb Levering M.D.   On: 11/24/2015 22:36   US Renal  11/25/2015  CLINICAL DATA:  Acute renal failure EXAM: RENAL / URINARY TRACT ULTRASOUND COMPLETE COMPARISON:  CT abdomen pelvis - 05/31/2015; 10/07/2013 FINDINGS: Right Kidney: Normal cortical thickness, echogenicity and size, measuring 11.5 cm in length. No focal renal lesions. No echogenic renal stones. No urinary obstruction. Left Kidney: Normal cortical thickness, echogenicity and size, measuring 10.9 cm in length. No focal renal lesions. No echogenic renal stones. No urinary obstruction. Bladder: Minimal amount of mixed echogenic debris is noted lying dependently within the urinary bladder (image 36). Otherwise, normal appearance of the urinary bladder given degree distention. IMPRESSION: 1. No explanation for patient's acute renal failure. Specifically, no evidence of urinary obstruction or morphologic evidence of medical renal disease. 2. Minimal amount of debris noted lying dependently within otherwise normal-appearing urinary bladder. Correlation with urinalysis is recommended. Electronically Signed   By: Sandi Mariscal M.D.   On: 11/25/2015 09:06     CBC  Recent Labs Lab 11/22/15 1502 11/24/15 2208 11/25/15 0230  WBC 17.4* 33.0* 26.2*  HGB  --  11.0* 9.6*  HCT 33.8* 34.6* 29.7*  PLT 323 236 194  MCV 86 82.8 82.9  MCH 27.6 26.4 26.6  MCHC 32.0 31.9* 32.1  RDW 17.3* 18.8* 18.9*  LYMPHSABS 1.9 1.8  --  MONOABS  --  1.6*  --   EOSABS 0.0 0.0  --   BASOSABS 0.0 0.1  --     Chemistries   Recent Labs Lab 11/22/15 1502 11/24/15 2208 11/25/15 0230  NA 135 133* 132*  K 4.6 3.5 3.3*  CL 100 105 106  CO2 19 17* 19*  GLUCOSE 266* 55* 109*  BUN 23 37* 34*  CREATININE 1.41* 1.80* 1.75*  CALCIUM 8.6* 8.5* 7.1*  AST 18 28  --   ALT 10 25  --   ALKPHOS 53 91  --   BILITOT 0.2 0.3  --    ------------------------------------------------------------------------------------------------------------------ estimated  creatinine clearance is 23.6 mL/min (by C-G formula based on Cr of 1.75). ------------------------------------------------------------------------------------------------------------------ No results for input(s): HGBA1C in the last 72 hours. ------------------------------------------------------------------------------------------------------------------ No results for input(s): CHOL, HDL, LDLCALC, TRIG, CHOLHDL, LDLDIRECT in the last 72 hours. ------------------------------------------------------------------------------------------------------------------ No results for input(s): TSH, T4TOTAL, T3FREE, THYROIDAB in the last 72 hours.  Invalid input(s): FREET3 ------------------------------------------------------------------------------------------------------------------ No results for input(s): VITAMINB12, FOLATE, FERRITIN, TIBC, IRON, RETICCTPCT in the last 72 hours.  Coagulation profile No results for input(s): INR, PROTIME in the last 168 hours.  No results for input(s): DDIMER in the last 72 hours.  Cardiac Enzymes No results for input(s): CKMB, TROPONINI, MYOGLOBIN in the last 168 hours.  Invalid input(s): CK ------------------------------------------------------------------------------------------------------------------ Invalid input(s): [place]  #1. Urinary tract infection with sepsis with positive blood cultures, blood cultures showing Escherichia coli but also positive for enterococcus await official culture results. Urine cultures are not back yet. Continue meropenem. Repeat CBC this morning. #2. Acute renal failure with ATN due to sepsis from UTI: No further renal function this morning I will order her blood work today #3 diabetes mellitus type 2: At this point will continue same dose Lantus and sliding scale #4. Severe dementia: Continue Zoloft, Ativan as needed. #5 history of ulcerative colitis; patient is on Humira. Mesalamine., Prednisone. #6  hypertension controlled continue metoprolol XL 25 mg daily #7 hypothyroidism ;continue Synthroid. #8 history of coronary artery disease: Continue aspirin, Plavix, #9.hypokalemia, hyponatremia: Repeat BMP this morning is pending Discussed with the patient's daughter. All the records are reviewed and case discussed with Care Management/Social Workerr. Management plans discussed with the patient, family and they are in agreement.       Code Status Orders        Start     Ordered   11/25/15 0035  Do not attempt resuscitation (DNR)   Continuous    Question Answer Comment  In the event of cardiac or respiratory ARREST Do not call a "code blue"   In the event of cardiac or respiratory ARREST Do not perform Intubation, CPR, defibrillation or ACLS   In the event of cardiac or respiratory ARREST Use medication by any route, position, wound care, and other measures to relive pain and suffering. May use oxygen, suction and manual treatment of airway obstruction as needed for comfort.      11/25/15 0035    Code Status History    Date Active Date Inactive Code Status Order ID Comments User Context   05/31/2015  8:03 PM 06/10/2015  1:44 PM DNR [id number]  Vaughan Basta, MD Inpatient    Advance Directive Documentation        Most Recent Value   Type of Advance Directive  Healthcare Power of Attorney, Living will   Pre-existing out of facility DNR order (yellow form or pink MOST form)     "MOST"  Form in Place?             Consults  none DVT Prophylaxis  Lovenox  Lab Results  Component Value Date   PLT 194 11/25/2015     Time Spent in minutes 65min Greater than 50% of time spent in care coordination and counseling patient regarding the condition and plan of care.   Dustin Flock M.D on 11/26/2015 at 10:01 AM  Between 7am to 6pm - Pager - [phone]  After 6pm go to [email or web address] - password [email or web address] [facility]   Office  [phone]

## 2015-11-27 LAB — CULTURE, BLOOD (ROUTINE X 2)

## 2015-11-27 LAB — GLUCOSE, CAPILLARY
GLUCOSE-CAPILLARY: 100 mg/dL — AB (ref 65–99)
GLUCOSE-CAPILLARY: 361 mg/dL — AB (ref 65–99)
Glucose-Capillary: 313 mg/dL — ABNORMAL HIGH (ref 65–99)
Glucose-Capillary: 269 mg/dL — ABNORMAL HIGH (ref 65–99)

## 2015-11-27 LAB — CBC
HCT: 34.8 % — ABNORMAL LOW (ref 35.0–47.0)
HEMOGLOBIN: 11.3 g/dL — AB (ref 12.0–16.0)
MCH: 26.7 pg (ref 26.0–34.0)
MCHC: 32.5 g/dL (ref 32.0–36.0)
MCV: 82 fL (ref 80.0–100.0)
PLATELETS: 228 10*3/uL (ref 150–440)
RBC: 4.24 MIL/uL (ref 3.80–5.20)
RDW: 19.2 % — AB (ref 11.5–14.5)
WBC: 13.6 10*3/uL — ABNORMAL HIGH (ref 3.6–11.0)

## 2015-11-27 LAB — URINE CULTURE

## 2015-11-27 LAB — BASIC METABOLIC PANEL
Anion gap: 9 (ref 5–15)
BUN: 24 mg/dL — AB (ref 6–20)
CALCIUM: 8.9 mg/dL (ref 8.9–10.3)
CHLORIDE: 114 mmol/L — AB (ref 101–111)
CO2: 20 mmol/L — ABNORMAL LOW (ref 22–32)
CREATININE: 1.4 mg/dL — AB (ref 0.44–1.00)
GFR, EST AFRICAN AMERICAN: 41 mL/min — AB (ref >60)
GFR, EST NON AFRICAN AMERICAN: 35 mL/min — AB (ref >60)
Glucose, Bld: 106 mg/dL — ABNORMAL HIGH (ref 65–99)
Potassium: 4.1 mmol/L (ref 3.5–5.1)
SODIUM: 143 mmol/L (ref 135–145)

## 2015-11-27 MED ORDER — DEXTROSE 5 % IV SOLN
1.0000 g | INTRAVENOUS | Status: DC
  Filled 2015-11-27: qty 10

## 2015-11-27 MED ORDER — DEXTROSE 5 % IV SOLN
2.0000 g | INTRAVENOUS | Status: DC
  Administered 2015-11-27: 2 g via INTRAVENOUS
  Filled 2015-11-27 (×2): qty 2

## 2015-11-27 NOTE — Progress Notes (Signed)
Per protocol, ceftriaxone was increased to 2g q 24 due to tx of bacteremia and UTI. Blood cx and urine cx growing ecoli sensitive to ceftriaxone. Higher doses of ceftriaxone are needed for bacteremia.  Ramond Dial, Pharm.D Clinical Pharmacist

## 2015-11-27 NOTE — Clinical Social Work Note (Signed)
Clinical Social Work Assessment  Patient Details  Name: Bethany Russell MRN: [id number] Date of Birth: 1941-04-18  Date of referral:  11/27/15               Reason for consult:  Facility Placement                Permission sought to share information with:  Facility Sport and exercise psychologist, Family Supports Permission granted to share information::  Yes, Hospital doctor (given by daughter Bethany Russell)  Name::        Agency::     Relationship::     Contact Information:     Housing/Transportation Living arrangements for the past 2 months:  Single Family Home Source of Information:  Medical Team, Adult Children Patient Interpreter Needed:  None Criminal Activity/Legal Involvement Pertinent to Current Situation/Hospitalization:    Significant Relationships:  Adult Children, Other Family Members Lives with:  Adult Children Do you feel safe going back to the place where you live?    Need for family participation in patient care:  Yes (Comment) (daughter)  Care giving concerns:  Daughter is concerned that patient needs that patient is weak and will need rehab   Social Worker assessment / plan:  Clinical Social Worker (CSW) consult for SNF placement, PT is recommending STR to assist with getting patient back to previous level of functioning.   Patient was alert to self only, disoriented and very pleasant.  (Additional Information provided by daughter Bethany Russell who is the primary contact).  CSW introduced self and explained role of CSW department.  Patient currently lives with her daughter Bethany Russell.  Per daughter she uses a walker and cane when home.  Patient's daughter works and she has sitters with her during the day. CSW explained that PT is recommending rehab.  Patient has been to SNF in 2011. CSW reviewed SNF process with patient's daughter, she is agreeable to SNF search and prefers [facility].    CSW will complete FL2, and fax out for available bed in anticipation of  discharging to SNF for rehab.   Employment status:    Nurse, adult PT Recommendations:  [place] / Referral to community resources:  [place]  Patient/Family's Response to care:  Patient's daughter was appreciative of talking with CSW she will review the SNF list.  Patient/Family's Understanding of and Emotional Response to Diagnosis, Current Treatment, and Prognosis:  Patient's daughter  understands that she is under continued medical work up at this time.  Once medically stable patient will discharge to SNF for STR.  Emotional Assessment Appearance:  Appears stated age Attitude/Demeanor/Rapport:    Affect (typically observed):  Calm, Pleasant Orientation:  Oriented to Self Alcohol / Substance use:    Psych involvement (Current and /or in the community):  No (Comment)  Discharge Needs  Concerns to be addressed:  Care Coordination, Discharge Planning Concerns Readmission within the last 30 days:  No Current discharge risk:  Chronically ill, Cognitively Impaired, Dependent with Mobility Barriers to Discharge:  Continued Medical Work up   [employer], LCSW 11/27/2015, 4:12 PM

## 2015-11-27 NOTE — Progress Notes (Signed)
[place] [facility]                                                                                                                                                                                            Patient Demographics   Bethany Russell, is a 75 y.o. female, DOB - Nov 05, 1940, [id number]  Admit date - 11/24/2015   Admitting Physician Hillary Bow, MD  Outpatient Primary MD for the patient is Sheral Flow, NP   LOS - 2  Subjective: She is doing okay. Urine culture and blood cultures are showing Escherichia coli. Daughter at bedside.    Review of Systems:   CONSTITUTIONAL: Unable to provide due to her advanced dementia  Vitals:   Filed Vitals:   11/26/15 1415 11/26/15 2055 11/27/15 0323 11/27/15 0628  BP: 134/76 171/79  172/71  Pulse: 100 95  81  Temp: 98.6 F (37 C) 98.5 F (36.9 C)  97.9 F (36.6 C)  TempSrc: Oral Oral  Oral  Resp: 15 18  18   Height:      Weight:   61.326 kg (135 lb 3.2 oz)   SpO2: 96% 98%  98%    Wt Readings from Last 3 Encounters:  11/27/15 61.326 kg (135 lb 3.2 oz)  11/22/15 60.873 kg (134 lb 3.2 oz)  09/20/15 61.689 kg (136 lb)     Intake/Output Summary (Last 24 hours) at 11/27/15 1006 Last data filed at 11/26/15 2300  Gross per 24 hour  Intake    580 ml  Output   1200 ml  Net   -620 ml    Physical Exam:   GENERAL: Pleasant-appearing in no apparent distress.  HEAD, EYES, EARS, NOSE AND THROAT: Atraumatic, normocephalic. Extraocular muscles are intact. Pupils equal and reactive to light. Sclerae anicteric. No conjunctival injection. No oro-pharyngeal erythema.  NECK: Supple. There is no jugular venous distention. No bruits, no lymphadenopathy, no thyromegaly.  HEART: Regular rate and rhythm,. No murmurs, no rubs, no clicks.  LUNGS: Clear to auscultation bilaterally. No rales or rhonchi. No wheezes.  ABDOMEN: Soft, flat, nontender, nondistended. Has good bowel sounds. No  hepatosplenomegaly appreciated.  EXTREMITIES: No evidence of any cyanosis, clubbing, or peripheral edema.  +2 pedal and radial pulses bilaterally.  NEUROLOGIC: The patient is alert, awake, not oriented to place or time only to person  SKIN: Moist and warm with no rashes appreciated.  Psych: Not anxious, depressed LN: No inguinal LN enlargement    Antibiotics   Anti-infectives    Start     Dose/Rate Route Frequency Ordered Stop   11/27/15 1000  cefTRIAXone (ROCEPHIN) 1 g  in dextrose 5 % 50 mL IVPB  Status:  Discontinued     1 g 100 mL/hr over 30 Minutes Intravenous Every 24 hours 11/27/15 0955 11/27/15 0959   11/27/15 1000  cefTRIAXone (ROCEPHIN) 2 g in dextrose 5 % 50 mL IVPB     2 g 100 mL/hr over 30 Minutes Intravenous Every 24 hours 11/27/15 0959     11/26/15 1800  cefTRIAXone (ROCEPHIN) 1 g in dextrose 5 % 50 mL IVPB  Status:  Discontinued     1 g 100 mL/hr over 30 Minutes Intravenous  Once 11/25/15 0031 11/25/15 1028   11/25/15 1800  cefTRIAXone (ROCEPHIN) 1 g in dextrose 5 % 50 mL IVPB  Status:  Discontinued     1 g 100 mL/hr over 30 Minutes Intravenous Every 24 hours 11/25/15 1028 11/25/15 1331   11/25/15 1400  meropenem (MERREM) 1 g in sodium chloride 0.9 % 100 mL IVPB  Status:  Discontinued     1 g 200 mL/hr over 30 Minutes Intravenous Every 12 hours 11/25/15 1331 11/27/15 0955   11/24/15 2330  cefTRIAXone (ROCEPHIN) 1 g in dextrose 5 % 50 mL IVPB     1 g 100 mL/hr over 30 Minutes Intravenous  Once 11/24/15 2328 11/25/15 0024      Medications   Scheduled Meds: . aspirin  81 mg Oral Daily  . cefTRIAXone (ROCEPHIN)  IV  2 g Intravenous Q24H  . clopidogrel  75 mg Oral Daily  . enoxaparin (LOVENOX) injection  30 mg Subcutaneous Q24H  . feeding supplement (GLUCERNA SHAKE)  237 mL Oral TID WC  . ferrous sulfate  325 mg Oral QHS  . insulin aspart  0-5 Units Subcutaneous QHS  . insulin aspart  0-9 Units Subcutaneous TID WC  . insulin glargine  15 Units Subcutaneous Daily   . levothyroxine  25 mcg Oral QAC breakfast  . loperamide  2 mg Oral BID  . memantine  14 mg Oral Daily  . Mesalamine  800 mg Oral TID  . metoprolol succinate  25 mg Oral Daily  . pantoprazole  40 mg Oral Daily  . pravastatin  80 mg Oral QHS  . predniSONE  10 mg Oral Q breakfast  . sertraline  100 mg Oral Daily   Continuous Infusions:  PRN Meds:.acetaminophen **OR** acetaminophen, bisacodyl, diclofenac sodium, diphenhydrAMINE, ondansetron **OR** ondansetron (ZOFRAN) IV, polyethylene glycol, sodium phosphate, ziprasidone   Data Review:   Micro Results Recent Results (from the past 240 hour(s))  Culture, blood (routine x 2)     Status: None   Collection Time: 11/24/15 10:08 PM  Result Value Ref Range Status   Specimen Description BLOOD LEFT ASSIST CONTROL  Final   Special Requests   Final    BOTTLES DRAWN AEROBIC AND ANAEROBIC 11CCAERO,3CCANA   Culture  Setup Time   Final    GRAM NEGATIVE RODS IN BOTH AEROBIC AND ANAEROBIC BOTTLES CRITICAL RESULT CALLED TO, READ BACK BY AND VERIFIED WITH: CHRISTINE KATSOUDAS AT [id number] ON 11/25/15. CTJ    Culture   Final    ESCHERICHIA COLI IN BOTH AEROBIC AND ANAEROBIC BOTTLES    Report Status 11/27/2015 FINAL  Final   Organism ID, Bacteria ESCHERICHIA COLI  Final      Susceptibility   Escherichia coli - MIC*    AMPICILLIN >=32 RESISTANT Resistant     CEFAZOLIN <=4 SENSITIVE Sensitive     CEFEPIME <=1 SENSITIVE Sensitive     CEFTAZIDIME <=1 SENSITIVE Sensitive     CEFTRIAXONE <=1 SENSITIVE  Sensitive     CIPROFLOXACIN <=0.25 SENSITIVE Sensitive     GENTAMICIN <=1 SENSITIVE Sensitive     IMIPENEM <=0.25 SENSITIVE Sensitive     TRIMETH/SULFA <=20 SENSITIVE Sensitive     AMPICILLIN/SULBACTAM 16 INTERMEDIATE Intermediate     PIP/TAZO <=4 SENSITIVE Sensitive     Extended ESBL NEGATIVE Sensitive     * ESCHERICHIA COLI  Urine culture     Status: None   Collection Time: 11/24/15 10:08 PM  Result Value Ref Range Status   Specimen Description  URINE, RANDOM  Final   Special Requests NONE  Final   Culture >=100,000 COLONIES/mL ESCHERICHIA COLI  Final   Report Status 11/27/2015 FINAL  Final   Organism ID, Bacteria ESCHERICHIA COLI  Final      Susceptibility   Escherichia coli - MIC*    AMPICILLIN >=32 RESISTANT Resistant     CEFAZOLIN <=4 SENSITIVE Sensitive     CEFTRIAXONE <=1 SENSITIVE Sensitive     CIPROFLOXACIN <=0.25 SENSITIVE Sensitive     GENTAMICIN <=1 SENSITIVE Sensitive     IMIPENEM <=0.25 SENSITIVE Sensitive     NITROFURANTOIN <=16 SENSITIVE Sensitive     TRIMETH/SULFA <=20 SENSITIVE Sensitive     AMPICILLIN/SULBACTAM 16 INTERMEDIATE Intermediate     PIP/TAZO <=4 SENSITIVE Sensitive     Extended ESBL NEGATIVE Sensitive     * >=100,000 COLONIES/mL ESCHERICHIA COLI  Blood Culture ID Panel (Reflexed)     Status: Abnormal   Collection Time: 11/24/15 10:08 PM  Result Value Ref Range Status   Enterococcus species NOT DETECTED NOT DETECTED Final   Vancomycin resistance NOT DETECTED NOT DETECTED Final   Listeria monocytogenes NOT DETECTED NOT DETECTED Final   Staphylococcus species NOT DETECTED NOT DETECTED Final   Staphylococcus aureus NOT DETECTED NOT DETECTED Final   Methicillin resistance NOT DETECTED NOT DETECTED Final   Streptococcus species NOT DETECTED NOT DETECTED Final   Streptococcus agalactiae NOT DETECTED NOT DETECTED Final   Streptococcus pneumoniae NOT DETECTED NOT DETECTED Final   Streptococcus pyogenes NOT DETECTED NOT DETECTED Final   Acinetobacter baumannii NOT DETECTED NOT DETECTED Final   Enterobacteriaceae species DETECTED (A) NOT DETECTED Final    Comment: CRITICAL RESULT CALLED TO, READ BACK BY AND VERIFIED WITH: CHRISTINE KATSOUDAS FOR ENTEROBACTERIACEAE AND E. COLI AT 1314 ON 11/25/15. CTJ    Enterobacter cloacae complex NOT DETECTED NOT DETECTED Final   Escherichia coli DETECTED (A) NOT DETECTED Final   Klebsiella oxytoca NOT DETECTED NOT DETECTED Final   Klebsiella pneumoniae NOT DETECTED  NOT DETECTED Final   Proteus species NOT DETECTED NOT DETECTED Final   Serratia marcescens NOT DETECTED NOT DETECTED Final   Carbapenem resistance NOT DETECTED NOT DETECTED Final   Haemophilus influenzae NOT DETECTED NOT DETECTED Final   Neisseria meningitidis NOT DETECTED NOT DETECTED Final   Pseudomonas aeruginosa NOT DETECTED NOT DETECTED Final   Candida albicans NOT DETECTED NOT DETECTED Final   Candida glabrata NOT DETECTED NOT DETECTED Final   Candida krusei NOT DETECTED NOT DETECTED Final   Candida parapsilosis NOT DETECTED NOT DETECTED Final   Candida tropicalis NOT DETECTED NOT DETECTED Final  Culture, blood (routine x 2)     Status: None   Collection Time: 11/24/15 11:08 PM  Result Value Ref Range Status   Specimen Description BLOOD RIGHT FOREARM  Final   Special Requests BOTTLES DRAWN AEROBIC AND ANAEROBIC 2CCAERO,2CCANA  Final   Culture  Setup Time   Final    GRAM NEGATIVE RODS IN BOTH AEROBIC AND ANAEROBIC BOTTLES  CRITICAL VALUE NOTED.  VALUE IS CONSISTENT WITH PREVIOUSLY REPORTED AND CALLED VALUE.    Culture   Final    ESCHERICHIA COLI IN BOTH AEROBIC AND ANAEROBIC BOTTLES    Report Status 11/27/2015 FINAL  Final   Organism ID, Bacteria ESCHERICHIA COLI  Final      Susceptibility   Escherichia coli - MIC*    AMPICILLIN >=32 RESISTANT Resistant     CEFAZOLIN <=4 SENSITIVE Sensitive     CEFEPIME <=1 SENSITIVE Sensitive     CEFTAZIDIME <=1 SENSITIVE Sensitive     CEFTRIAXONE <=1 SENSITIVE Sensitive     CIPROFLOXACIN <=0.25 SENSITIVE Sensitive     GENTAMICIN <=1 SENSITIVE Sensitive     IMIPENEM <=0.25 SENSITIVE Sensitive     TRIMETH/SULFA <=20 SENSITIVE Sensitive     AMPICILLIN/SULBACTAM 16 INTERMEDIATE Intermediate     PIP/TAZO <=4 SENSITIVE Sensitive     Extended ESBL NEGATIVE Sensitive     * ESCHERICHIA COLI    Radiology Reports Dg Chest 2 View  11/24/2015  CLINICAL DATA:  Fever, onset 2 days prior. EXAM: CHEST  2 VIEW COMPARISON:  05/26/2015 FINDINGS:  Patient is post median sternotomy. The cardiomediastinal contours are normal. Posterior basilar opacities on the lateral view favoring atelectasis. Pulmonary vasculature is normal. No confluent airspace disease, pleural effusion, or pneumothorax. No acute osseous abnormalities are seen. IMPRESSION: Basilar atelectasis.  No evidence of pneumonia. Electronically Signed   By: Jeb Levering M.D.   On: 11/24/2015 22:36   US Renal  11/25/2015  CLINICAL DATA:  Acute renal failure EXAM: RENAL / URINARY TRACT ULTRASOUND COMPLETE COMPARISON:  CT abdomen pelvis - 05/31/2015; 10/07/2013 FINDINGS: Right Kidney: Normal cortical thickness, echogenicity and size, measuring 11.5 cm in length. No focal renal lesions. No echogenic renal stones. No urinary obstruction. Left Kidney: Normal cortical thickness, echogenicity and size, measuring 10.9 cm in length. No focal renal lesions. No echogenic renal stones. No urinary obstruction. Bladder: Minimal amount of mixed echogenic debris is noted lying dependently within the urinary bladder (image 36). Otherwise, normal appearance of the urinary bladder given degree distention. IMPRESSION: 1. No explanation for patient's acute renal failure. Specifically, no evidence of urinary obstruction or morphologic evidence of medical renal disease. 2. Minimal amount of debris noted lying dependently within otherwise normal-appearing urinary bladder. Correlation with urinalysis is recommended. Electronically Signed   By: Sandi Mariscal M.D.   On: 11/25/2015 09:06     CBC  Recent Labs Lab 11/22/15 1502 11/24/15 2208 11/25/15 0230 11/26/15 1009 11/27/15 0627  WBC 17.4* 33.0* 26.2* 17.6* 13.6*  HGB  --  11.0* 9.6* 10.6* 11.3*  HCT 33.8* 34.6* 29.7* 32.6* 34.8*  PLT 323 236 194 203 228  MCV 86 82.8 82.9 82.8 82.0  MCH 27.6 26.4 26.6 26.9 26.7  MCHC 32.0 31.9* 32.1 32.5 32.5  RDW 17.3* 18.8* 18.9* 19.0* 19.2*  LYMPHSABS 1.9 1.8  --  1.3  --   MONOABS  --  1.6*  --  1.6*  --   EOSABS  0.0 0.0  --  0.1  --   BASOSABS 0.0 0.1  --  0.1  --     Chemistries   Recent Labs Lab 11/22/15 1502 11/24/15 2208 11/25/15 0230 11/26/15 1009 11/27/15 0627  NA 135 133* 132* 140 143  K 4.6 3.5 3.3* 4.1 4.1  CL 100 105 106 116* 114*  CO2 19 17* 19* 19* 20*  GLUCOSE 266* 55* 109* 202* 106*  BUN 23 37* 34* 30* 24*  CREATININE 1.41* 1.80* 1.75*  1.67* 1.40*  CALCIUM 8.6* 8.5* 7.1* 7.9* 8.9  AST 18 28  --   --   --   ALT 10 25  --   --   --   ALKPHOS 53 91  --   --   --   BILITOT 0.2 0.3  --   --   --    ------------------------------------------------------------------------------------------------------------------ estimated creatinine clearance is 29.5 mL/min (by C-G formula based on Cr of 1.4). ------------------------------------------------------------------------------------------------------------------ No results for input(s): HGBA1C in the last 72 hours. ------------------------------------------------------------------------------------------------------------------ No results for input(s): CHOL, HDL, LDLCALC, TRIG, CHOLHDL, LDLDIRECT in the last 72 hours. ------------------------------------------------------------------------------------------------------------------ No results for input(s): TSH, T4TOTAL, T3FREE, THYROIDAB in the last 72 hours.  Invalid input(s): FREET3 ------------------------------------------------------------------------------------------------------------------ No results for input(s): VITAMINB12, FOLATE, FERRITIN, TIBC, IRON, RETICCTPCT in the last 72 hours.  Coagulation profile No results for input(s): INR, PROTIME in the last 168 hours.  No results for input(s): DDIMER in the last 72 hours.  Cardiac Enzymes No results for input(s): CKMB, TROPONINI, MYOGLOBIN in the last 168 hours.  Invalid input(s): CK ------------------------------------------------------------------------------------------------------------------ Invalid input(s):  [place]  #1. Urinary tract infection with sepsis with positive blood cultures, both urine and blood cultures showed Escherichia coli. Sensitive to ceftriaxone I will discontinue meropenem #2. Acute renal failure with ATN due to sepsis from UTI: Creatinine continues to improve #3 diabetes mellitus type 2: Blood sugars labile. Continue same dose of insulin #4. Severe dementia: Continue Zoloft, Ativan as needed. At bedtime Seroquel low-dose #5 history of ulcerative colitis; patient is on Humira. Mesalamine., Prednisone. Has chronic intermittent diarrhea DC C. difficile precautions #6 hypertension controlled continue metoprolol XL 25 mg daily #7 hypothyroidism ;continue Synthroid. #8 history of coronary artery disease: Continue aspirin, Plavix, #9.hypokalemia, hyponatremia: Repeat BMP this morning is pending Discussed with the patient's daughter patient will need a discharge to skilled nursing facility I will ask social workers to start looking for a bed for her. All the records are reviewed and case discussed with Care Management/Social Workerr. Management plans discussed with the patient, family and they are in agreement.       Code Status Orders        Start     Ordered   11/25/15 0035  Do not attempt resuscitation (DNR)   Continuous    Question Answer Comment  In the event of cardiac or respiratory ARREST Do not call a "code blue"   In the event of cardiac or respiratory ARREST Do not perform Intubation, CPR, defibrillation or ACLS   In the event of cardiac or respiratory ARREST Use medication by any route, position, wound care, and other measures to relive pain and suffering. May use oxygen, suction and manual treatment of airway obstruction as needed for comfort.      11/25/15 0035    Code Status History    Date Active Date Inactive Code Status Order ID Comments User Context   05/31/2015  8:03 PM 06/10/2015  1:44 PM DNR [id number]  Vaughan Basta, MD  Inpatient    Advance Directive Documentation        Most Recent Value   Type of Advance Directive  Healthcare Power of Attorney, Living will   Pre-existing out of facility DNR order (yellow form or pink MOST form)     "MOST" Form in Place?             Consults  none DVT Prophylaxis  Lovenox  Lab Results  Component Value Date   PLT 228  11/27/2015     Time Spent in minutes44min Greater than 50% of time spent in care coordination and counseling patient regarding the condition and plan of care.   Dustin Flock M.D on 11/27/2015 at 10:06 AM  Between 7am to 6pm - Pager - [phone]  After 6pm go to [email or web address] - password [email or web address] [facility]   Office  [phone]

## 2015-11-28 LAB — CBC
HCT: 38.3 % (ref 35.0–47.0)
Hemoglobin: 12.4 g/dL (ref 12.0–16.0)
MCH: 26.9 pg (ref 26.0–34.0)
MCHC: 32.5 g/dL (ref 32.0–36.0)
MCV: 82.6 fL (ref 80.0–100.0)
PLATELETS: 254 10*3/uL (ref 150–440)
RBC: 4.63 MIL/uL (ref 3.80–5.20)
RDW: 19.1 % — AB (ref 11.5–14.5)
WBC: 12.8 10*3/uL — AB (ref 3.6–11.0)

## 2015-11-28 LAB — GLUCOSE, CAPILLARY
GLUCOSE-CAPILLARY: 333 mg/dL — AB (ref 65–99)
Glucose-Capillary: 173 mg/dL — ABNORMAL HIGH (ref 65–99)

## 2015-11-28 MED ORDER — CEFUROXIME AXETIL 500 MG PO TABS: 500.0000 mg | ORAL_TABLET | Freq: Two times a day (BID) | ORAL | Status: AC

## 2015-11-28 MED ORDER — QUETIAPINE FUMARATE 25 MG PO TABS: 25.0000 mg | ORAL_TABLET | Freq: Every day | ORAL | Status: DC

## 2015-11-28 MED ORDER — POLYETHYLENE GLYCOL 3350 17 G PO PACK: 17.0000 g | PACK | Freq: Every day | ORAL | Status: DC

## 2015-11-28 NOTE — Discharge Summary (Signed)
[address], 75 y.o., DOB 1941/07/12, MRN [id number]. Admission date: 11/24/2015 Discharge Date 11/28/2015 Primary MD Sheral Flow, NP Admitting Physician Hillary Bow, MD  Admission Diagnosis  Pyelonephritis [N12] Sepsis, due to unspecified organism [facility]) [A41.9]  Discharge Diagnosis   Active Problems:   UTI (lower urinary tract infection) with sepsis Acute encephalopathy due to urinary tract infection Acute renal failure with ATN due to sepsis from UTI Diabetes type 2 Dementia History ulcerative colitis with intermittent diarrhea Hypertension Hypothyroidism History of coronary artery Hypokalemia hyponatremia        Bethany Russell is a 75 y.o. female with a known history of hypertension, diabetes, ulcerative colitis on chronic prednisone presents to the emergency room brought in by the daughter due to fevers for 3 days. Highest fever recorded home was 103. Patient was noted to in the ER to have a WBC count of 33,000 and a urinary tract infection. That she had Escherichia coli in her blood stream as well as her urine cultures. Patient was started on IV antibiotics. With resolution of her fevers and her WBC count being 12,000 today. Her urine cultures showed sensitivity to ceftriaxone she was treated with ceftriaxone will be changed to Ceftin on discharge. Will need total of 14 days of antibiotics. Patient also has progressive dementia and her daughter requested palliative care consult however there was no palliative care team coverage during the hospitalization. She'll need palliative care follow-up at the facility. She is a DO NOT RESUSCITATE. Patient also noticed to have electrolyte imbalances and acute renal failure that improved with IV fluids.           Consults  None  Significant Tests:  See full reports for all details      Dg Chest 2 View  11/24/2015  CLINICAL DATA:  Fever, onset 2 days prior. EXAM: CHEST  2 VIEW COMPARISON:   05/26/2015 FINDINGS: Patient is post median sternotomy. The cardiomediastinal contours are normal. Posterior basilar opacities on the lateral view favoring atelectasis. Pulmonary vasculature is normal. No confluent airspace disease, pleural effusion, or pneumothorax. No acute osseous abnormalities are seen. IMPRESSION: Basilar atelectasis.  No evidence of pneumonia. Electronically Signed   By: Jeb Levering M.D.   On: 11/24/2015 22:36   US Renal  11/25/2015  CLINICAL DATA:  Acute renal failure EXAM: RENAL / URINARY TRACT ULTRASOUND COMPLETE COMPARISON:  CT abdomen pelvis - 05/31/2015; 10/07/2013 FINDINGS: Right Kidney: Normal cortical thickness, echogenicity and size, measuring 11.5 cm in length. No focal renal lesions. No echogenic renal stones. No urinary obstruction. Left Kidney: Normal cortical thickness, echogenicity and size, measuring 10.9 cm in length. No focal renal lesions. No echogenic renal stones. No urinary obstruction. Bladder: Minimal amount of mixed echogenic debris is noted lying dependently within the urinary bladder (image 36). Otherwise, normal appearance of the urinary bladder given degree distention. IMPRESSION: 1. No explanation for patient's acute renal failure. Specifically, no evidence of urinary obstruction or morphologic evidence of medical renal disease. 2. Minimal amount of debris noted lying dependently within otherwise normal-appearing urinary bladder. Correlation with urinalysis is recommended. Electronically Signed   By: Sandi Mariscal M.D.   On: 11/25/2015 09:06       Today   Subjective:   Bethany Russell patient currently sleeping but no other issues has baseline dementia unable to provide any review of systems  Objective:   Blood pressure 130/64, pulse 88, temperature 97.7 F (36.5 C), temperature source Oral, resp. rate 16, height 5\' 4"  (1.626 m), weight 60.011 kg (  132 lb 4.8 oz), SpO2 98 %.  .  Intake/Output Summary (Last 24 hours) at 11/28/15 1025 Last data  filed at 11/28/15 0545  Gross per 24 hour  Intake    770 ml  Output    625 ml  Net    145 ml    Exam VITAL SIGNS: Blood pressure 130/64, pulse 88, temperature 97.7 F (36.5 C), temperature source Oral, resp. rate 16, height 5\' 4"  (1.626 m), weight 60.011 kg (132 lb 4.8 oz), SpO2 98 %.  GENERAL:  75 y.o.-year-old patient lying in the bed with no acute distress.  EYES: Pupils equal, round, reactive to light and accommodation. No scleral icterus. Extraocular muscles intact.  HEENT: Head atraumatic, normocephalic. Oropharynx and nasopharynx clear.  NECK:  Supple, no jugular venous distention. No thyroid enlargement, no tenderness.  LUNGS: Normal breath sounds bilaterally, no wheezing, rales,rhonchi or crepitation. No use of accessory muscles of respiration.  CARDIOVASCULAR: S1, S2 normal. No murmurs, rubs, or gallops.  ABDOMEN: Soft, nontender, nondistended. Bowel sounds present. No organomegaly or mass.  EXTREMITIES: No pedal edema, cyanosis, or clubbing.  NEUROLOGIC: Cranial nerves II through XII are intact. Muscle strength 5/5 in all extremities. Sensation intact. Gait not checked.  PSYCHIATRIC: The patient is alert and oriented x 3.  SKIN: No obvious rash, lesion, or ulcer.   Data Review     CBC w Diff: Lab Results  Component Value Date   WBC 12.8* 11/28/2015   WBC 17.4* 11/22/2015   WBC 10.9 10/13/2013   HGB 12.4 11/28/2015   HGB 9.2* 10/13/2013   HCT 38.3 11/28/2015   HCT 33.8* 11/22/2015   HCT 29.1* 10/13/2013   PLT 254 11/28/2015   PLT 323 11/22/2015   PLT 471* 10/13/2013   LYMPHOPCT 8 11/26/2015   LYMPHOPCT 17.5 10/13/2013   MONOPCT 9 11/26/2015   MONOPCT 8.7 10/13/2013   EOSPCT 0 11/26/2015   EOSPCT 0.0 10/13/2013   BASOPCT 1 11/26/2015   BASOPCT 0.1 10/13/2013   CMP: Lab Results  Component Value Date   NA 143 11/27/2015   NA 135 11/22/2015   NA 134* 10/13/2013   K 4.1 11/27/2015   K 4.0 10/13/2013   CL 114* 11/27/2015   CL 105 10/13/2013   CO2 20*  11/27/2015   CO2 23 10/13/2013   BUN 24* 11/27/2015   BUN 23 11/22/2015   BUN 11 10/13/2013   CREATININE 1.40* 11/27/2015   CREATININE 1.07 10/13/2013   PROT 7.3 11/24/2015   PROT 6.4 11/22/2015   PROT 6.1* 10/13/2013   ALBUMIN 3.4* 11/24/2015   ALBUMIN 3.9 11/22/2015   ALBUMIN 2.2* 10/13/2013   BILITOT 0.3 11/24/2015   BILITOT 0.2 11/22/2015   BILITOT 0.2 10/13/2013   ALKPHOS 91 11/24/2015   ALKPHOS 56 10/13/2013   AST 28 11/24/2015   AST 21 10/13/2013   ALT 25 11/24/2015   ALT 25 10/13/2013  .  Micro Results Recent Results (from the past 240 hour(s))  Culture, blood (routine x 2)     Status: None   Collection Time: 11/24/15 10:08 PM  Result Value Ref Range Status   Specimen Description BLOOD LEFT ASSIST CONTROL  Final   Special Requests   Final    BOTTLES DRAWN AEROBIC AND ANAEROBIC 11CCAERO,3CCANA   Culture  Setup Time   Final    GRAM NEGATIVE RODS IN BOTH AEROBIC AND ANAEROBIC BOTTLES CRITICAL RESULT CALLED TO, READ BACK BY AND VERIFIED WITH: CHRISTINE KATSOUDAS AT [id number] ON 11/25/15. CTJ    Culture  Final    ESCHERICHIA COLI IN BOTH AEROBIC AND ANAEROBIC BOTTLES    Report Status 11/27/2015 FINAL  Final   Organism ID, Bacteria ESCHERICHIA COLI  Final      Susceptibility   Escherichia coli - MIC*    AMPICILLIN >=32 RESISTANT Resistant     CEFAZOLIN <=4 SENSITIVE Sensitive     CEFEPIME <=1 SENSITIVE Sensitive     CEFTAZIDIME <=1 SENSITIVE Sensitive     CEFTRIAXONE <=1 SENSITIVE Sensitive     CIPROFLOXACIN <=0.25 SENSITIVE Sensitive     GENTAMICIN <=1 SENSITIVE Sensitive     IMIPENEM <=0.25 SENSITIVE Sensitive     TRIMETH/SULFA <=20 SENSITIVE Sensitive     AMPICILLIN/SULBACTAM 16 INTERMEDIATE Intermediate     PIP/TAZO <=4 SENSITIVE Sensitive     Extended ESBL NEGATIVE Sensitive     * ESCHERICHIA COLI  Urine culture     Status: None   Collection Time: 11/24/15 10:08 PM  Result Value Ref Range Status   Specimen Description URINE, RANDOM  Final   Special  Requests NONE  Final   Culture >=100,000 COLONIES/mL ESCHERICHIA COLI  Final   Report Status 11/27/2015 FINAL  Final   Organism ID, Bacteria ESCHERICHIA COLI  Final      Susceptibility   Escherichia coli - MIC*    AMPICILLIN >=32 RESISTANT Resistant     CEFAZOLIN <=4 SENSITIVE Sensitive     CEFTRIAXONE <=1 SENSITIVE Sensitive     CIPROFLOXACIN <=0.25 SENSITIVE Sensitive     GENTAMICIN <=1 SENSITIVE Sensitive     IMIPENEM <=0.25 SENSITIVE Sensitive     NITROFURANTOIN <=16 SENSITIVE Sensitive     TRIMETH/SULFA <=20 SENSITIVE Sensitive     AMPICILLIN/SULBACTAM 16 INTERMEDIATE Intermediate     PIP/TAZO <=4 SENSITIVE Sensitive     Extended ESBL NEGATIVE Sensitive     * >=100,000 COLONIES/mL ESCHERICHIA COLI  Blood Culture ID Panel (Reflexed)     Status: Abnormal   Collection Time: 11/24/15 10:08 PM  Result Value Ref Range Status   Enterococcus species NOT DETECTED NOT DETECTED Final   Vancomycin resistance NOT DETECTED NOT DETECTED Final   Listeria monocytogenes NOT DETECTED NOT DETECTED Final   Staphylococcus species NOT DETECTED NOT DETECTED Final   Staphylococcus aureus NOT DETECTED NOT DETECTED Final   Methicillin resistance NOT DETECTED NOT DETECTED Final   Streptococcus species NOT DETECTED NOT DETECTED Final   Streptococcus agalactiae NOT DETECTED NOT DETECTED Final   Streptococcus pneumoniae NOT DETECTED NOT DETECTED Final   Streptococcus pyogenes NOT DETECTED NOT DETECTED Final   Acinetobacter baumannii NOT DETECTED NOT DETECTED Final   Enterobacteriaceae species DETECTED (A) NOT DETECTED Final    Comment: CRITICAL RESULT CALLED TO, READ BACK BY AND VERIFIED WITH: CHRISTINE KATSOUDAS FOR ENTEROBACTERIACEAE AND E. COLI AT 1314 ON 11/25/15. CTJ    Enterobacter cloacae complex NOT DETECTED NOT DETECTED Final   Escherichia coli DETECTED (A) NOT DETECTED Final   Klebsiella oxytoca NOT DETECTED NOT DETECTED Final   Klebsiella pneumoniae NOT DETECTED NOT DETECTED Final   Proteus  species NOT DETECTED NOT DETECTED Final   Serratia marcescens NOT DETECTED NOT DETECTED Final   Carbapenem resistance NOT DETECTED NOT DETECTED Final   Haemophilus influenzae NOT DETECTED NOT DETECTED Final   Neisseria meningitidis NOT DETECTED NOT DETECTED Final   Pseudomonas aeruginosa NOT DETECTED NOT DETECTED Final   Candida albicans NOT DETECTED NOT DETECTED Final   Candida glabrata NOT DETECTED NOT DETECTED Final   Candida krusei NOT DETECTED NOT DETECTED Final   Candida parapsilosis NOT  DETECTED NOT DETECTED Final   Candida tropicalis NOT DETECTED NOT DETECTED Final  Culture, blood (routine x 2)     Status: None   Collection Time: 11/24/15 11:08 PM  Result Value Ref Range Status   Specimen Description BLOOD RIGHT FOREARM  Final   Special Requests BOTTLES DRAWN AEROBIC AND ANAEROBIC 2CCAERO,2CCANA  Final   Culture  Setup Time   Final    GRAM NEGATIVE RODS IN BOTH AEROBIC AND ANAEROBIC BOTTLES CRITICAL VALUE NOTED.  VALUE IS CONSISTENT WITH PREVIOUSLY REPORTED AND CALLED VALUE.    Culture   Final    ESCHERICHIA COLI IN BOTH AEROBIC AND ANAEROBIC BOTTLES    Report Status 11/27/2015 FINAL  Final   Organism ID, Bacteria ESCHERICHIA COLI  Final      Susceptibility   Escherichia coli - MIC*    AMPICILLIN >=32 RESISTANT Resistant     CEFAZOLIN <=4 SENSITIVE Sensitive     CEFEPIME <=1 SENSITIVE Sensitive     CEFTAZIDIME <=1 SENSITIVE Sensitive     CEFTRIAXONE <=1 SENSITIVE Sensitive     CIPROFLOXACIN <=0.25 SENSITIVE Sensitive     GENTAMICIN <=1 SENSITIVE Sensitive     IMIPENEM <=0.25 SENSITIVE Sensitive     TRIMETH/SULFA <=20 SENSITIVE Sensitive     AMPICILLIN/SULBACTAM 16 INTERMEDIATE Intermediate     PIP/TAZO <=4 SENSITIVE Sensitive     Extended ESBL NEGATIVE Sensitive     * ESCHERICHIA COLI        Code Status Orders        Start     Ordered   11/25/15 0035  Do not attempt resuscitation (DNR)   Continuous    Question Answer Comment  In the event of cardiac or  respiratory ARREST Do not call a "code blue"   In the event of cardiac or respiratory ARREST Do not perform Intubation, CPR, defibrillation or ACLS   In the event of cardiac or respiratory ARREST Use medication by any route, position, wound care, and other measures to relive pain and suffering. May use oxygen, suction and manual treatment of airway obstruction as needed for comfort.      11/25/15 0035    Code Status History    Date Active Date Inactive Code Status Order ID Comments User Context   05/31/2015  8:03 PM 06/10/2015  1:44 PM DNR [id number]  Vaughan Basta, MD Inpatient    Advance Directive Documentation        Most Recent Value   Type of Advance Directive  Healthcare Power of Attorney, Living will   Pre-existing out of facility DNR order (yellow form or pink MOST form)     "MOST" Form in Place?            Follow-up Information    Follow up with [facility].   Specialty:  [place] information:   [address] [place] [phone]      Follow up with Sheral Flow, NP In 10 days.   Specialty:  Internal Medicine   Contact information:   [place] [address] [phone]       Discharge Medications     Medication List    STOP taking these medications        insulin lispro 100 UNIT/ML KiwkPen  Commonly known as:  HUMALOG KWIKPEN      TAKE these medications        alendronate 70 MG tablet  Commonly known as:  FOSAMAX  Take 1 tablet (70 mg total) by mouth once a week.     ASACOL HD 800 MG Tbec  Generic drug:  Mesalamine  Take 800 mg by mouth 3 (three) times daily.     aspirin 81 MG tablet  Take 81 mg by mouth daily.     cefUROXime 500 MG tablet  Commonly known as:  CEFTIN  Take 1 tablet (500 mg total) by mouth 2 (two) times daily with a meal.     clopidogrel 75 MG tablet  Commonly known as:  PLAVIX  Take 75 mg by mouth daily.     feeding  supplement (GLUCERNA SHAKE) Liqd  Take 237 mLs by mouth 3 (three) times daily with meals.     ferrous sulfate 325 (65 FE) MG tablet  Take 325 mg by mouth at bedtime.     HUMIRA PEN 40 MG/0.8ML Pnkt  Generic drug:  Adalimumab  Inject 40 mg into the skin every 14 (fourteen) days. Pt uses on Tuesday.     Insulin Glargine 100 UNIT/ML Solostar Pen  Commonly known as:  LANTUS SOLOSTAR  Inject 26 Units into the skin daily.     levothyroxine 25 MCG tablet  Commonly known as:  SYNTHROID, LEVOTHROID  Take 25 mcg by mouth daily.     loperamide 2 MG capsule  Commonly known as:  IMODIUM  Take 2 mg by mouth 2 (two) times daily.     Melatonin 3 MG Tabs  Take 3 mg by mouth at bedtime.     memantine 14 MG Cp24 24 hr capsule  Commonly known as:  NAMENDA XR  Take 1 capsule (14 mg total) by mouth daily.     metoprolol succinate 25 MG 24 hr tablet  Commonly known as:  TOPROL-XL  Take 1 tablet (25 mg total) by mouth daily.     pantoprazole 40 MG tablet  Commonly known as:  PROTONIX  Take 40 mg by mouth daily.     polyethylene glycol packet  Commonly known as:  MIRALAX / GLYCOLAX  Take 17 g by mouth daily.     pravastatin 80 MG tablet  Commonly known as:  PRAVACHOL  Take 1 tablet (80 mg total) by mouth at bedtime.     predniSONE 10 MG tablet  Commonly known as:  DELTASONE  Take 5 tablets (50 mg total) by mouth daily with breakfast.     QUEtiapine 25 MG tablet  Commonly known as:  SEROQUEL  Take 1 tablet (25 mg total) by mouth at bedtime.     sertraline 100 MG tablet  Commonly known as:  ZOLOFT  Take 1 tablet (100 mg total) by mouth daily.     Vitamin D (Ergocalciferol) 50000 units Caps capsule  Commonly known as:  DRISDOL  Take 50,000 Units by mouth once a week. Pt takes on Sunday.     VOLTAREN 1 % Gel  Generic drug:  diclofenac sodium  Apply 2 g topically 4 (four) times daily as needed (for pain).           Total Time in preparing paper work, data evaluation and  todays exam - 35 minutes  Dustin Flock M.D on 11/28/2015 at 10:25 AM  [facility]   Office  [phone]

## 2015-11-28 NOTE — Clinical Social Work Placement (Signed)
   CLINICAL SOCIAL WORK PLACEMENT  NOTE  Date:  11/28/2015  Patient Details  Name: Bethany Russell MRN: [id number] Date of Birth: 1941-08-30  Clinical Social Work is seeking post-discharge placement for this patient at the [place] level of care (*CSW will initial, date and re-position this form in  chart as items are completed):  Yes   Patient/family provided with [place] Work Department's list of facilities offering this level of care within the geographic area requested by the patient (or if unable, by the patient's family).  Yes   Patient/family informed of their freedom to choose among providers that offer the needed level of care, that participate in Medicare, Medicaid or managed care program needed by the patient, have an available bed and are willing to accept the patient.      Patient/family informed of [place]'s ownership interest in [facility] and [facility], as well as of the fact that they are under no obligation to receive care at these facilities.  PASRR submitted to EDS on 11/27/15     PASRR number received on       Existing PASRR number confirmed on       FL2 transmitted to all facilities in geographic area requested by pt/family on 11/27/15     FL2 transmitted to all facilities within larger geographic area on       Patient informed that his/her managed care company has contracts with or will negotiate with certain facilities, including the following:        Yes   Patient/family informed of bed offers received.  Patient chooses bed at  [facility])     Physician recommends and patient chooses bed at  [facility])    Patient to be transferred to  [facility]) on 11/28/15.  Patient to be transferred to facility by daughter     Patient family notified on 11/21/15 of transfer.  Name of family member notified:  daughter     PHYSICIAN       Additional Comment:     _______________________________________________ Shela Leff, LCSW 11/28/2015, 11:20 AM

## 2015-11-28 NOTE — Care Management (Signed)
PT to discharge to SNF.  RNCM signing off

## 2015-11-28 NOTE — Progress Notes (Signed)
Alert and oriented x 1. Calm and cooperative. Not restless. Vss. No signs of acute distress. Discharge instructions given. Family  verbalizes understanding.

## 2015-11-28 NOTE — NC FL2 (Signed)
[place] LEVEL OF CARE SCREENING TOOL     IDENTIFICATION  Patient Name: Bethany Russell Birthdate: [birth year] Sex: female Admission Date (Current Location): 11/24/2015  [place] and [place] Number:  Engineering geologist and Address:  [facility], [address] [place], [place] [address]      Provider Number: [id number]  Attending Physician Name and Address:  Dustin Flock, MD  Relative Name and Phone Number:       Current Level of Care: Hospital Recommended Level of Care: [place] Prior Approval Number:    Date Approved/Denied:   PASRR Number:    Discharge Plan: SNF    Current Diagnoses: Patient Active Problem List   Diagnosis Date Noted  . UTI (lower urinary tract infection) 11/25/2015  . Arteriosclerosis of coronary artery 11/22/2015  . Ulcerative colitis ([place]) 09/20/2015  . GAD (generalized anxiety disorder) 09/20/2015  . Hyperlipidemia 09/20/2015  . Dementia 09/20/2015  . Coronary artery disease 06/07/2015  . Acute posthemorrhagic anemia 06/07/2015  . DM (diabetes mellitus) type 2, uncontrolled, with ketoacidosis ([place]) 06/07/2015  . Pressure ulcer 06/01/2015  . Protein-calorie malnutrition, severe ([place]) 06/01/2015  . Severe protein-calorie malnutrition Altamease Oiler: less than 60% of standard weight) ([place]) 06/01/2015  . Sepsis ([place]) 05/31/2015  . Dysthymia 02/04/2015  . Other long term (current) drug therapy 02/04/2015  . Acquired hypothyroidism 08/06/2014  . Chronic kidney disease, stage III (moderate) 08/06/2014  . 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 H/O adenomatous polyp of colon 08/06/2014  . Degeneration of intervertebral disc of lumbar region 08/06/2014  . HLD (hyperlipidemia) 08/06/2014  . 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 Type 2 diabetes mellitus ([place]) 08/06/2014  . Carotid artery narrowing 06/15/2014  . Bradycardia 06/15/2014  . Absolute anemia 05/12/2014  . BP  (high blood pressure) 05/12/2014  . 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 Avitaminosis D 05/12/2014    Orientation RESPIRATION BLADDER Height & Weight     Self  Normal Incontinent (At times) Weight: 132 lb 4.8 oz (60.011 kg) Height:  5\' 4"  (162.6 cm)  BEHAVIORAL SYMPTOMS/MOOD NEUROLOGICAL BOWEL NUTRITION STATUS      Incontinent (At times) Diet (Carb modified fluid consistency)  AMBULATORY STATUS COMMUNICATION OF NEEDS Skin   Extensive Assist Verbally Normal                       Personal Care Assistance Level of Assistance  Bathing, Feeding, Dressing Bathing Assistance: Limited assistance Feeding assistance: Limited assistance Dressing Assistance: Limited assistance     Functional Limitations Info  Sight, Hearing, Speech Sight Info: Adequate Hearing Info: Adequate Speech Info: Adequate    SPECIAL CARE FACTORS FREQUENCY  PT (By licensed PT)     PT Frequency:  (5X week)              Contractures Contractures Info: Not present    Additional Factors Info  Code Status, Allergies Code Status Info:  (DNR) Allergies Info:  (NKA)           Current Medications (11/28/2015):  This is the current hospital active medication list Current Facility-Administered Medications  Medication Dose Route Frequency Provider Last Rate Last Dose  . acetaminophen (TYLENOL) tablet 650 mg  650 mg Oral Q6H PRN Hillary Bow, MD   650 mg at 11/25/15 1034   Or  . acetaminophen (TYLENOL) suppository 650 mg  650 mg Rectal Q6H PRN Srikar Sudini, MD      . aspirin chewable tablet 81 mg  81 mg Oral Daily Hillary Bow, MD   81 mg at 11/28/15 0926  . bisacodyl (DULCOLAX) EC tablet 5 mg  5 mg Oral Daily PRN Srikar Sudini, MD      . cefTRIAXone (ROCEPHIN) 2 g in dextrose 5 % 50 mL IVPB  2 g Intravenous Q24H Dustin Flock, MD   2 g at 11/27/15 1201  . clopidogrel (PLAVIX) tablet 75 mg  75 mg Oral Daily Hillary Bow, MD   75 mg at 11/28/15 0926  . diclofenac sodium (VOLTAREN) 1 %  transdermal gel 2 g  2 g Topical QID PRN Srikar Sudini, MD      . diphenhydrAMINE (BENADRYL) capsule 50 mg  50 mg Oral QHS PRN Hillary Bow, MD   50 mg at 11/25/15 2309  . enoxaparin (LOVENOX) injection 30 mg  30 mg Subcutaneous Q24H Hillary Bow, MD   30 mg at 11/27/15 2107  . feeding supplement (GLUCERNA SHAKE) (GLUCERNA SHAKE) liquid 237 mL  237 mL Oral TID WC Srikar Sudini, MD   237 mL at 11/28/15 0800  . ferrous sulfate tablet 325 mg  325 mg Oral QHS Hillary Bow, MD   325 mg at 11/27/15 2106  . insulin aspart (novoLOG) injection 0-5 Units  0-5 Units Subcutaneous QHS Hillary Bow, MD   3 Units at 11/27/15 2146  . insulin aspart (novoLOG) injection 0-9 Units  0-9 Units Subcutaneous TID WC Hillary Bow, MD   2 Units at 11/28/15 0925  . insulin glargine (LANTUS) injection 15 Units  15 Units Subcutaneous Daily Epifanio Lesches, MD   15 Units at 11/28/15 [phone]  . levothyroxine (SYNTHROID, LEVOTHROID) tablet 25 mcg  25 mcg Oral QAC breakfast Hillary Bow, MD   25 mcg at 11/28/15 0926  . loperamide (IMODIUM) capsule 2 mg  2 mg Oral BID Hillary Bow, MD   2 mg at 11/28/15 0926  . memantine (NAMENDA XR) 24 hr capsule 14 mg  14 mg Oral Daily Hillary Bow, MD   14 mg at 11/28/15 [id number]  . Mesalamine (ASACOL) DR capsule 800 mg  800 mg Oral TID Hillary Bow, MD   800 mg at 11/28/15 0927  . metoprolol succinate (TOPROL-XL) 24 hr tablet 25 mg  25 mg Oral Daily Hillary Bow, MD   25 mg at 11/28/15 0953  . ondansetron (ZOFRAN) tablet 4 mg  4 mg Oral Q6H PRN Hillary Bow, MD       Or  . ondansetron (ZOFRAN) injection 4 mg  4 mg Intravenous Q6H PRN Srikar Sudini, MD      . pantoprazole (PROTONIX) EC tablet 40 mg  40 mg Oral Daily Hillary Bow, MD   40 mg at 11/28/15 0926  . polyethylene glycol (MIRALAX / GLYCOLAX) packet 17 g  17 g Oral Daily PRN Srikar Sudini, MD      . pravastatin (PRAVACHOL) tablet 80 mg  80 mg Oral QHS Hillary Bow, MD   80 mg at 11/27/15 2106  . predniSONE (DELTASONE) tablet  10 mg  10 mg Oral Q breakfast Hillary Bow, MD   10 mg at 11/28/15 0926  . sertraline (ZOLOFT) tablet 100 mg  100 mg Oral Daily Hillary Bow, MD   100 mg at 11/28/15 [id number]  . sodium phosphate (FLEET) 7-19 GM/118ML enema 1 enema  1 enema Rectal Once PRN Srikar Sudini, MD      . ziprasidone (GEODON) capsule 20 mg  20 mg Oral Q12H PRN Epifanio Lesches, MD   20 mg at 11/27/15 0010  Discharge Medications: Please see discharge summary for a list of discharge medications.  Relevant Imaging Results:  Relevant Lab Results:   Additional Information  (SSN: [id number])  Shela Leff, LCSW

## 2015-11-28 NOTE — Clinical Social Work Note (Signed)
Patient's daughter has chosen [facility]. Pasrr pending for today. MD has discharged patient for today and daughter is going over to do admission paperwork at [facility]. She, the MD, and WOM are aware we are awaiting pasrr. Discharge information sent to [facility]. Patient's daughter states she will transport. Shela Leff MSW,LCSW [phone]

## 2015-11-28 NOTE — Discharge Instructions (Signed)
°  DIET:  Diabetic diet  DISCHARGE CONDITION:  Stable  ACTIVITY:  Activity as tolerated  OXYGEN:  Home Oxygen: No.   Oxygen Delivery: room air  DISCHARGE LOCATION:  nursing home    ADDITIONAL DISCHARGE INSTRUCTION: pallatiive care consult   If you experience worsening of your admission symptoms, develop shortness of breath, life threatening emergency, suicidal or homicidal thoughts you must seek medical attention immediately by calling 911 or calling your MD immediately  if symptoms less severe.  You Must read complete instructions/literature along with all the possible adverse reactions/side effects for all the Medicines you take and that have been prescribed to you. Take any new Medicines after you have completely understood and accpet all the possible adverse reactions/side effects.   Please note  You were cared for by a hospitalist during your hospital stay. If you have any questions about your discharge medications or the care you received while you were in the hospital after you are discharged, you can call the unit and asked to speak with the hospitalist on call if the hospitalist that took care of you is not available. Once you are discharged, your primary care physician will handle any further medical issues. Please note that NO REFILLS for any discharge medications will be authorized once you are discharged, as it is imperative that you return to your primary care physician (or establish a relationship with a primary care physician if you do not have one) for your aftercare needs so that they can reassess your need for medications and monitor your lab values.

## 2015-11-28 NOTE — Care Management Important Message (Signed)
Important Message  Patient Details  Name: Bethany Russell MRN: [id number] Date of Birth: 04-26-1941   Medicare Important Message Given:  Yes    Juliann Pulse A Rion Schnitzer 11/28/2015, 11:06 AM

## 2015-12-20 ENCOUNTER — Encounter: Payer: Self-pay | Admitting: Psychiatry

## 2015-12-20 ENCOUNTER — Ambulatory Visit (INDEPENDENT_AMBULATORY_CARE_PROVIDER_SITE_OTHER): Payer: 59 | Admitting: Psychiatry

## 2015-12-20 VITALS — BP 122/86 | HR 122 | Temp 98.8°F | Ht 64.0 in | Wt 132.2 lb

## 2015-12-20 DIAGNOSIS — G309 Alzheimer's disease, unspecified: Secondary | ICD-10-CM | POA: Diagnosis not present

## 2015-12-20 DIAGNOSIS — F0280 Dementia in other diseases classified elsewhere without behavioral disturbance: Secondary | ICD-10-CM

## 2015-12-20 MED ORDER — MEMANTINE HCL ER 14 MG PO CP24: 14.0000 mg | ORAL_CAPSULE | Freq: Every day | ORAL | Status: DC

## 2015-12-20 NOTE — Progress Notes (Signed)
Psychiatric MD progress note  Patient Identification: Bethany Russell MRN:  [id number] Date of Evaluation:  12/20/2015 Referral Source: [facility].  Chief Complaint:   Chief Complaint    Follow-up; Medication Refill     Visit Diagnosis:    ICD-9-CM ICD-10-CM   1. Dementia in Alzheimer's disease 331.0 G30.9    294.10 F02.80    Diagnosis:   Patient Active Problem List   Diagnosis Date Noted  . UTI (lower urinary tract infection) [N39.0] 11/25/2015  . Arteriosclerosis of coronary artery [I25.10] 11/22/2015  . Ulcerative colitis ([place]) [K51.90] 09/20/2015  . GAD (generalized anxiety disorder) [F41.1] 09/20/2015  . Hyperlipidemia [E78.5] 09/20/2015  . Dementia [F03.90] 09/20/2015  . Coronary artery disease [I25.10] 06/07/2015  . Acute posthemorrhagic anemia [D62] 06/07/2015  . DM (diabetes mellitus) type 2, uncontrolled, with ketoacidosis ([place]) [E13.10] 06/07/2015  . Pressure ulcer [L89.90] 06/01/2015  . Protein-calorie malnutrition, severe ([place]) [E43] 06/01/2015  . Severe protein-calorie malnutrition Altamease Oiler: less than 60% of standard weight) ([place]) [E43] 06/01/2015  . Sepsis ([place]) [A41.9] 05/31/2015  . Dysthymia [F34.1] 02/04/2015  . Other long term (current) drug therapy [Z79.899] 02/04/2015  . Acquired hypothyroidism [E03.9] 08/06/2014  . Chronic kidney disease, stage III (moderate) [N18.3] 08/06/2014  . 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 H/O adenomatous polyp of colon [Z86.010] 08/06/2014  . 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 HLD (hyperlipidemia) [E78.5] 08/06/2014  . 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 Type 2 diabetes mellitus ([place]) [E11.9] 08/06/2014  . 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 Bradycardia [R00.1] 06/15/2014  . Absolute anemia [D64.9] 05/12/2014  . BP (high blood pressure) [I10] 05/12/2014  . 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 Avitaminosis D [E55.9] 05/12/2014   History of Present Illness:    She is a 75 year old white female who presented for follow-up accompanied by her daughter. Her daughter reported that she was admitted to the medical floor after she was diagnosed with kidney infection. She stayed there for 3 days. Her kidney function still not recovered well as she has history of diabetes. She was started on Namenda XR14 mg and she is doing well on the medications. Patient was hallucinating while in the hospital and was responding to internal stimuli. She was given a prescription of Seroquel but she never took the medication. She is currently not exhibiting any perceptual disturbances. She was smiling and cooperative during the interview. She continues to have memory decline but no behavior problems noted at this time. Her daughter reported that she was sent to the SNF after discharge from the hospital but her daughter did not allow her to stay. Due to the poor conditions at the facility. She brought her home and is getting her therapy over there. Patient was calm and smiling during the interview. She does not have any adverse effects of the medication. We discussed about decreasing the dose of Zoloft to 50 mg as patient does not have any history of depression or anxiety. She denied having any mood swings anger anxiety or paranoia No suicidal homicidal ideations or plans noted        Past Medical History:  Past Medical History  Diagnosis Date  . Ulcerative colitis ([place])   . Hypertension   . Diabetes mellitus without complication ([place])   . Cancer ([place])     skin cancer  . Coronary artery disease   . GI bleed   . Urinary tract infection   . Vitamin D deficiency   .  Dementia   . Diabetes mellitus, type II ([place])   . Thyroid disease     Past Surgical History  Procedure Laterality Date  . Coronary artery bypass graft    . Skin biopsy    . Flexible sigmoidoscopy N/A 06/08/2015    Procedure: FLEXIBLE  SIGMOIDOSCOPY;  Surgeon: Manya Silvas, MD;  Location: [facility] ENDOSCOPY;  Service: Endoscopy;  Laterality: N/A;  . Colonoscopy N/A 06/09/2015    Procedure: COLONOSCOPY;  Surgeon: Manya Silvas, MD;  Location: [facility] ENDOSCOPY;  Service: Endoscopy;  Laterality: N/A;   Family History:  Family History  Problem Relation Age of Onset  . CAD Mother   . CAD Father   . Colon cancer Brother    Social History:   Social History   Social History  . Marital Status: Widowed    Spouse Name: N/A  . Number of Children: N/A  . Years of Education: N/A   Social History Main Topics  . Smoking status: Never Smoker   . Smokeless tobacco: None  . Alcohol Use: No  . Drug Use: No  . Sexual Activity: No   Other Topics Concern  . None   Social History Narrative   Single.   Living at home.   Dementia.   Enjoys watching TV.   Additional Social History:  Patient is currently widowed as her husband passed away in 2009-01-07. She is living with her daughter and her family.  Musculoskeletal: Strength & Muscle Tone: within normal limits Gait & Station: normal Patient leans: N/A  Psychiatric Specialty Exam: HPI   ROS   Blood pressure 122/86, pulse 122, temperature 98.8 F (37.1 C), temperature source Tympanic, height 5\' 4"  (1.626 m), weight 132 lb 3.2 oz (59.966 kg), SpO2 93 %.Body mass index is 22.68 kg/(m^2).  General Appearance: Casual  Eye Contact:  Fair  Speech:  Clear and Coherent  Volume:  Decreased  Mood:  Anxious  Affect:  Congruent  Thought Process:  Circumstantial  Orientation:  Full (Time, Place, and Person)  Thought Content:  Hallucinations: Visual  Suicidal Thoughts:  No  Homicidal Thoughts:  No  Memory:  Immediate;   Poor  Judgement:  Fair  Insight:  Fair  Psychomotor Activity:  Psychomotor Retardation  Concentration:  Fair  Recall:  [place]: Fair  Akathisia:  No  Handed:  Right  AIMS (if indicated):    Assets:  Engineer, petroleum  ADL's:  Intact  Cognition: Impaired,  Moderate  Sleep:      Allergies:  No Known Allergies Current Medications: Current Outpatient Prescriptions  Medication Sig Dispense Refill  . alendronate (FOSAMAX) 70 MG tablet Take 1 tablet (70 mg total) by mouth once a week. 12 tablet 3  . aspirin 81 MG tablet Take 81 mg by mouth daily.    . clopidogrel (PLAVIX) 75 MG tablet Take 75 mg by mouth daily.    . diclofenac sodium (VOLTAREN) 1 % GEL Apply 2 g topically 4 (four) times daily as needed (for pain).     . feeding supplement, GLUCERNA SHAKE, (GLUCERNA SHAKE) LIQD Take 237 mLs by mouth 3 (three) times daily with meals. 60 Can 4  . ferrous sulfate 325 (65 FE) MG tablet Take 325 mg by mouth at bedtime.     Marland Kitchen HUMIRA PEN 40 MG/0.8ML PNKT Inject 40 mg into the skin every 14 (fourteen) days. Pt uses on 01/08/23. 2 each 5  . Insulin Glargine (LANTUS SOLOSTAR) 100 UNIT/ML Solostar Pen  Inject 26 Units into the skin daily. 15 mL 11  . levothyroxine (SYNTHROID, LEVOTHROID) 25 MCG tablet Take 25 mcg by mouth daily.     Marland Kitchen loperamide (IMODIUM) 2 MG capsule Take 2 mg by mouth 2 (two) times daily.     . Melatonin 3 MG TABS Take 3 mg by mouth at bedtime.     . memantine (NAMENDA XR) 14 MG CP24 24 hr capsule Take 1 capsule (14 mg total) by mouth daily. 30 capsule 1  . Mesalamine (ASACOL HD) 800 MG TBEC Take 800 mg by mouth 3 (three) times daily.     . metoprolol succinate (TOPROL-XL) 25 MG 24 hr tablet Take 1 tablet (25 mg total) by mouth daily. 90 tablet 2  . pantoprazole (PROTONIX) 40 MG tablet Take 40 mg by mouth daily.     . polyethylene glycol (MIRALAX / GLYCOLAX) packet Take 17 g by mouth daily. 14 each 0  . pravastatin (PRAVACHOL) 80 MG tablet Take 1 tablet (80 mg total) by mouth at bedtime. 90 tablet 3  . predniSONE (DELTASONE) 10 MG tablet Take 5 tablets (50 mg total) by mouth daily with breakfast. (Patient taking differently: Take by mouth daily with breakfast. ) 60 tablet 3  .  QUEtiapine (SEROQUEL) 25 MG tablet Take 1 tablet (25 mg total) by mouth at bedtime. 30 tablet   . sertraline (ZOLOFT) 100 MG tablet Take 1 tablet (100 mg total) by mouth daily. 90 tablet 2  . Vitamin D, Ergocalciferol, (DRISDOL) 50000 UNITS CAPS capsule Take 50,000 Units by mouth once a week. Pt takes on Sunday.     No current facility-administered medications for this visit.    Previous Psychotropic Medications:  Patient's daughter  reported that patient has long history of drinking alcohol in the past. She quit drinking in 01/11/2009 after the death of her husband. She went to the rehabilitation program at that time. Since then she has not been drinking. She has never seen a psychiatrist in the past. She is currently on Zoloft 100 mg daily. She was tried on trazodone while she was in the inpatient medical unit but did not do well on the medication. She currently takes melatonin to help with the sleep.  Substance Abuse History in the last 12 months:  No.  Consequences of Substance Abuse: Negative NA  Medical Decision Making:  Review of Psycho-Social Stressors (1)  Treatment Plan Summary: Medication management   Continue on Namenda XR 14 mg daily- prescription given Continue on Zoloft 50 mg daily- patient has supply    Follow-up in 2 months   More than 50% of the time spent in psychoeducation, counseling and coordination of care.    This note was generated in part or whole with voice recognition software. Voice regonition is usually quite accurate but there are transcription errors that can and very often do occur. I apologize for any typographical errors that were not detected and corrected.     [name], MD  3/21/20173:34 PM

## 2016-01-04 ENCOUNTER — Telehealth: Payer: Self-pay

## 2016-01-04 NOTE — Telephone Encounter (Signed)
daughter called wanted to find out if they could do geodon for her mother and her anxiety

## 2016-01-05 NOTE — Telephone Encounter (Signed)
daughter called again wanted to find out the statue of getting a rx for the geodon.pt daughter states the patient was given this when her mother was in the hospital and it helpped.  pt daughter states she has looked into [facility] for her mother but that she was wanting to try her on the geodon 1st.
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
## 2016-01-05 NOTE — Telephone Encounter (Signed)
Pt was given seroquel at hospital d/c

## 2016-01-06 ENCOUNTER — Encounter: Payer: Self-pay | Admitting: Psychiatry

## 2016-01-06 ENCOUNTER — Ambulatory Visit (INDEPENDENT_AMBULATORY_CARE_PROVIDER_SITE_OTHER): Payer: 59 | Admitting: Psychiatry

## 2016-01-06 VITALS — BP 118/72 | HR 114 | Temp 97.3°F | Ht 64.0 in | Wt 131.0 lb

## 2016-01-06 DIAGNOSIS — F0280 Dementia in other diseases classified elsewhere without behavioral disturbance: Secondary | ICD-10-CM

## 2016-01-06 DIAGNOSIS — G309 Alzheimer's disease, unspecified: Secondary | ICD-10-CM

## 2016-01-06 DIAGNOSIS — F411 Generalized anxiety disorder: Secondary | ICD-10-CM | POA: Diagnosis not present

## 2016-01-06 MED ORDER — RISPERIDONE 0.5 MG PO TBDP: 0.5000 mg | ORAL_TABLET | Freq: Two times a day (BID) | ORAL | Status: DC

## 2016-01-06 MED ORDER — SERTRALINE HCL 50 MG PO TABS: 100.0000 mg | ORAL_TABLET | Freq: Every day | ORAL | Status: DC

## 2016-01-06 MED ORDER — MEMANTINE HCL ER 28 MG PO CP24: 28.0000 mg | ORAL_CAPSULE | Freq: Every day | ORAL | Status: DC
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
## 2016-01-30 ENCOUNTER — Telehealth: Payer: Self-pay | Admitting: Psychiatry

## 2016-02-01 ENCOUNTER — Telehealth: Payer: Self-pay

## 2016-02-01 NOTE — Telephone Encounter (Signed)
orders were faxed and confirmed . per dr. Gretel Acre medication can be taken whenever they want it to be taken

## 2016-02-01 NOTE — Telephone Encounter (Signed)
Order written on prescription and will fax today.

## 2016-02-01 NOTE — Telephone Encounter (Signed)
tonya from [facility] needs a written order for patient to take the risperidone (they will not be able to give her the medication without a written order). the written order  Needs to have the medication name, dosage, how to take medication at what times pt is to take medication.  needs to be faxed to Attn: Tonya at [phone].   If you have any questions, call [phone].  Pt has appt on Monday.

## 2016-02-06 ENCOUNTER — Ambulatory Visit: Payer: 59 | Admitting: Psychiatry

## 2016-02-06 ENCOUNTER — Telehealth: Payer: Self-pay

## 2016-02-06 ENCOUNTER — Encounter: Payer: Self-pay | Admitting: Psychiatry

## 2016-02-06 ENCOUNTER — Ambulatory Visit (INDEPENDENT_AMBULATORY_CARE_PROVIDER_SITE_OTHER): Payer: Medicare Other | Admitting: Psychiatry

## 2016-02-06 VITALS — BP 114/68 | HR 141 | Temp 98.0°F | Wt 129.6 lb

## 2016-02-06 DIAGNOSIS — F411 Generalized anxiety disorder: Secondary | ICD-10-CM | POA: Diagnosis not present

## 2016-02-06 DIAGNOSIS — F0280 Dementia in other diseases classified elsewhere without behavioral disturbance: Secondary | ICD-10-CM | POA: Diagnosis not present

## 2016-02-06 DIAGNOSIS — G309 Alzheimer's disease, unspecified: Secondary | ICD-10-CM

## 2016-02-06 MED ORDER — RISPERIDONE 0.5 MG PO TBDP: 0.5000 mg | ORAL_TABLET | Freq: Two times a day (BID) | ORAL | Status: DC

## 2016-02-06 MED ORDER — MEMANTINE HCL ER 28 MG PO CP24: 28.0000 mg | ORAL_CAPSULE | Freq: Every day | ORAL | Status: DC

## 2016-02-06 MED ORDER — SERTRALINE HCL 50 MG PO TABS: 100.0000 mg | ORAL_TABLET | Freq: Every day | ORAL | Status: DC

## 2016-02-06 NOTE — Progress Notes (Signed)
Psychiatric MD progress note  Patient Identification: Bethany Russell MRN:  [id number] Date of Evaluation:  02/06/2016 Referral Source: [facility].  Chief Complaint:   Chief Complaint    Follow-up; Medication Refill     Visit Diagnosis:    ICD-9-CM ICD-10-CM   1. Dementia in Alzheimer's disease 331.0 G30.9    294.10 F02.80    Diagnosis:   Patient Active Problem List   Diagnosis Date Noted  . UTI (lower urinary tract infection) [N39.0] 11/25/2015  . Arteriosclerosis of coronary artery [I25.10] 11/22/2015  . Ulcerative colitis ([place]) [K51.90] 09/20/2015  . GAD (generalized anxiety disorder) [F41.1] 09/20/2015  . Hyperlipidemia [E78.5] 09/20/2015  . Dementia [F03.90] 09/20/2015  . Coronary artery disease [I25.10] 06/07/2015  . Acute posthemorrhagic anemia [D62] 06/07/2015  . DM (diabetes mellitus) type 2, uncontrolled, with ketoacidosis ([place]) [E13.10] 06/07/2015  . Pressure ulcer [L89.90] 06/01/2015  . Protein-calorie malnutrition, severe ([place]) [E43] 06/01/2015  . Severe protein-calorie malnutrition Altamease Oiler: less than 60% of standard weight) ([place]) [E43] 06/01/2015  . Sepsis ([place]) [A41.9] 05/31/2015  . Dysthymia [F34.1] 02/04/2015  . Other long term (current) drug therapy [Z79.899] 02/04/2015  . Acquired hypothyroidism [E03.9] 08/06/2014  . Chronic kidney disease, stage III (moderate) [N18.3] 08/06/2014  . 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 H/O adenomatous polyp of colon [Z86.010] 08/06/2014  . 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 HLD (hyperlipidemia) [E78.5] 08/06/2014  . 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 Type 2 diabetes mellitus ([place]) [E11.9] 08/06/2014  . 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 Bradycardia [R00.1] 06/15/2014  . Absolute anemia [D64.9] 05/12/2014  . BP (high blood pressure) [I10] 05/12/2014  . 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 Avitaminosis D [E55.9] 05/12/2014   History of Present Illness:    She is a 75 year old white female who presented for follow-up accompanied by staff at  [facility]. The staff reported that she has worsening of her dementia with behavioral problems especially in the afternoon when her blood sugar is high. Staff reported that she starts throwing things at that time. Staff reported that they have been trying to control her behavior. Patient appeared calm and pleasant during the interview and was smiling. A staff reported that they have been giving her medications as prescribed. However when I checked her efforts to it was noted that she is getting a lower dose of Namenda and 14 mg as well as Zoloft at 100 mg. We have adjusted the medications that her last visit but the medications have not been changed.  She continues to show behavioral problems at this time. I discussed with the staff about her medications in detail. The staff reported that they're going to adjust her medications at this time. She is not hallucinating at this time. She is not having any suicidal homicidal ideations or plans. She has been living at this place for the past 3-1/2 weeks.      Past Medical History:  Past Medical History  Diagnosis Date  . Ulcerative colitis ([place])   . Hypertension   . Diabetes mellitus without complication ([place])   . Cancer ([place])     skin cancer  . Coronary artery disease   . GI bleed   . Urinary tract infection   . Vitamin D deficiency   . Dementia   . Diabetes mellitus, type II ([place])   . Thyroid disease     Past Surgical History  Procedure Laterality Date  . Coronary artery bypass  graft    . Skin biopsy    . Flexible sigmoidoscopy N/A 06/08/2015    Procedure: FLEXIBLE SIGMOIDOSCOPY;  Surgeon: Manya Silvas, MD;  Location[facility] ENDOSCOPY;  Service: Endoscopy;  Laterality: N/A;  . Colonoscopy N/A 06/09/2015    Procedure: COLONOSCOPY;  Surgeon: Manya Silvas, MD;  Location:  [facility] ENDOSCOPY;  Service: Endoscopy;  Laterality: N/A;   Family History:  Family History  Problem Relation Age of Onset  . CAD Mother   . CAD Father   . Colon cancer Brother    Social History:   Social History   Social History  . Marital Status: Widowed    Spouse Name: N/A  . Number of Children: N/A  . Years of Education: N/A   Social History Main Topics  . Smoking status: Never Smoker   . Smokeless tobacco: Never Used  . Alcohol Use: No  . Drug Use: No  . Sexual Activity: No   Other Topics Concern  . None   Social History Narrative   Single.   Living at home.   Dementia.   Enjoys watching TV.   Additional Social History:  Patient is currently widowed as her husband passed away in 01/27/2009. She is living with her daughter and her family.  Musculoskeletal: Strength & Muscle Tone: within normal limits Gait & Station: normal Patient leans: N/A  Psychiatric Specialty Exam: HPI  ROS  Blood pressure 114/68, pulse 141, temperature 98 F (36.7 C), temperature source Tympanic, weight 129 lb 9.6 oz (58.786 kg), SpO2 93 %.Body mass index is 22.23 kg/(m^2).  General Appearance: Casual  Eye Contact:  Fair  Speech:  Clear and Coherent  Volume:  Decreased  Mood:  Anxious  Affect:  Inappropriate  Thought Process:  Circumstantial  Orientation:  Full (Time, Place, and Person)  Thought Content:  Hallucinations: Visual  Suicidal Thoughts:  No  Homicidal Thoughts:  No  Memory:  Immediate;   Poor  Judgement:  Fair  Insight:  Fair  Psychomotor Activity:  Psychomotor Retardation  Concentration:  Fair  Recall:  [place]: Fair  Akathisia:  No  Handed:  Right  AIMS (if indicated):    Assets:  Quarry manager  ADL's:  Intact  Cognition: Impaired,  Moderate  Sleep:      Allergies:  No Known Allergies Current Medications: Current Outpatient Prescriptions  Medication Sig Dispense Refill  . alendronate  (FOSAMAX) 70 MG tablet Take 1 tablet (70 mg total) by mouth once a week. 12 tablet 3  . aspirin 81 MG tablet Take 81 mg by mouth daily.    . clopidogrel (PLAVIX) 75 MG tablet Take 75 mg by mouth daily.    . diclofenac sodium (VOLTAREN) 1 % GEL Apply 2 g topically 4 (four) times daily as needed (for pain).     . feeding supplement, GLUCERNA SHAKE, (GLUCERNA SHAKE) LIQD Take 237 mLs by mouth 3 (three) times daily with meals. 60 Can 4  . ferrous sulfate 325 (65 FE) MG tablet Take 325 mg by mouth at bedtime.     Marland Kitchen HUMIRA PEN 40 MG/0.8ML PNKT Inject 40 mg into the skin every 14 (fourteen) days. Pt uses on 2023-01-28. 2 each 5  . Insulin Glargine (LANTUS SOLOSTAR) 100 UNIT/ML Solostar Pen Inject 26 Units into the skin daily. 15 mL 11  . levothyroxine (SYNTHROID, LEVOTHROID) 25 MCG tablet Take 25 mcg by mouth daily.     Marland Kitchen loperamide (IMODIUM) 2 MG capsule Take 2  mg by mouth 2 (two) times daily.     . Melatonin 3 MG TABS Take 3 mg by mouth at bedtime.     . memantine (NAMENDA XR) 28 MG CP24 24 hr capsule Take 1 capsule (28 mg total) by mouth daily. 30 capsule 1  . Mesalamine (ASACOL HD) 800 MG TBEC Take 800 mg by mouth 3 (three) times daily.     . metoprolol succinate (TOPROL-XL) 25 MG 24 hr tablet Take 1 tablet (25 mg total) by mouth daily. 90 tablet 2  . pantoprazole (PROTONIX) 40 MG tablet Take 40 mg by mouth daily.     . polyethylene glycol (MIRALAX / GLYCOLAX) packet Take 17 g by mouth daily. 14 each 0  . pravastatin (PRAVACHOL) 80 MG tablet Take 1 tablet (80 mg total) by mouth at bedtime. 90 tablet 3  . predniSONE (DELTASONE) 10 MG tablet Take 5 tablets (50 mg total) by mouth daily with breakfast. (Patient taking differently: Take by mouth daily with breakfast. ) 60 tablet 3  . risperiDONE (RISPERDAL M-TAB) 0.5 MG disintegrating tablet Take 1 tablet (0.5 mg total) by mouth 2 (two) times daily. 60 tablet 1  . sertraline (ZOLOFT) 50 MG tablet Take 2 tablets (100 mg total) by mouth daily. 30 tablet 1  .  Vitamin D, Ergocalciferol, (DRISDOL) 50000 UNITS CAPS capsule Take 50,000 Units by mouth once a week. Pt takes on Sunday.     No current facility-administered medications for this visit.    Previous Psychotropic Medications:  Patient's daughter  reported that patient has long history of drinking alcohol in the past. She quit drinking in 01-02-09 after the death of her husband. She went to the rehabilitation program at that time. Since then she has not been drinking. She has never seen a psychiatrist in the past. She is currently on Zoloft 100 mg daily. She was tried on trazodone while she was in the inpatient medical unit but did not do well on the medication. She currently takes melatonin to help with the sleep.  Substance Abuse History in the last 12 months:  No.  Consequences of Substance Abuse: Negative NA  Medical Decision Making:  Review of Psycho-Social Stressors (1)  Treatment Plan Summary: Medication management   Discussed with the staff about her medications and reviewed her medication list in their book.   I will start her on Risperdal M tab 0.5 mg by mouth daily at bedtime as well as on a when necessary basis during the daytime  She will also take Namenda XR 28 mg in the morning. She will continue on Zoloft 50 mg daily. She will follow-up in 2 months  or earlier depending on her symptoms    More than 50% of the time spent in psychoeducation, counseling and coordination of care.    This note was generated in part or whole with voice recognition software. Voice regonition is usually quite accurate but there are transcription errors that can and very often do occur. I apologize for any typographical errors that were not detected and corrected.     [name], MD  5/8/20172:39 PM

## 2016-02-06 NOTE — Telephone Encounter (Signed)
the care giver called states that they need a new order for the Namenda 28mg  instead of the 14mg  .  needs to be faxed to [facility] fax# [phone]

## 2016-02-07 ENCOUNTER — Other Ambulatory Visit: Payer: Self-pay | Admitting: Psychiatry

## 2016-02-07 MED ORDER — SERTRALINE HCL 50 MG PO TABS: 50.0000 mg | ORAL_TABLET | Freq: Every day | ORAL | Status: DC

## 2016-02-07 NOTE — Telephone Encounter (Signed)
it wrong on the paperwork it is not the 14mg .  Pt is taking the correct doses. the need to know if you are going to increase this medication beacause pt around 3:00 or 4:00 pt starts to get anxious.

## 2016-02-08 NOTE — Telephone Encounter (Signed)
Caregiver did not bring any paper work during the appointment to write the instructions. I gave her detailed list of the medications and discussed with her as well. No medications changes. She is supposed to be on namenda XR 28 . They are giving her wrong dose and I discussed with care giver and she agreed that they they will dispense 2 pills.

## 2016-02-13 NOTE — Telephone Encounter (Signed)
Bethany Russell called from the assistant living, she states that she believes the risperdal needs to be adjusted.  states that pt has no appetite, off balance and  to calm.

## 2016-02-13 NOTE — Telephone Encounter (Signed)
pt was just seen last monday 02-06-16. can they decrease her dosage or stop the risperdal.

## 2016-02-13 NOTE — Telephone Encounter (Signed)
plz make appointment and bring her medication list at time of appointment

## 2016-02-14 NOTE — Telephone Encounter (Signed)
Spoke with [place]. She stated that pt is becoming very calm, relaxed and mellow after taking Risperdal M- Tab 0.5 mg in am. She is not exhibiting any behavioral problems. Will d/c Risperdal In am. Will fax orders.

## 2016-03-05 ENCOUNTER — Telehealth: Payer: Self-pay

## 2016-03-05 NOTE — Telephone Encounter (Signed)
called  back to get more details.  states that pt is still out of it, tired alot, no energy. she would like fo find out if they can cut medication in half.  Needs new order sent to the home

## 2016-03-05 NOTE — Telephone Encounter (Signed)
jenny thompson called per [facility], left message on voice mail that she had concerns about the reisperdol.

## 2016-03-09 ENCOUNTER — Telehealth: Payer: Self-pay

## 2016-03-20 NOTE — Telephone Encounter (Signed)
error 

## 2016-03-26 ENCOUNTER — Telehealth: Payer: Self-pay

## 2016-03-26 NOTE — Telephone Encounter (Signed)
per the nurse at the family home they need a new rx for the zoloft.  she said that they have 100mg  take 1/2  but when I look in the system it a rx for 50mg  but according to your last note it is something else.  so not sure what dosage patient is suppose to take.  Please send a correct order with corrected dosage to fax number  [phone].

## 2016-03-31 ENCOUNTER — Inpatient Hospital Stay
Admission: EM | Admit: 2016-03-31 | Discharge: 2016-05-01 | DRG: 872 | Disposition: E | Payer: Medicare Other | Attending: Internal Medicine | Admitting: Internal Medicine

## 2016-03-31 ENCOUNTER — Emergency Department: Payer: Medicare Other

## 2016-03-31 DIAGNOSIS — B9620 Unspecified Escherichia coli [E. coli] as the cause of diseases classified elsewhere: Secondary | ICD-10-CM | POA: Diagnosis present

## 2016-03-31 DIAGNOSIS — N179 Acute kidney failure, unspecified: Secondary | ICD-10-CM | POA: Diagnosis present

## 2016-03-31 DIAGNOSIS — Z951 Presence of aortocoronary bypass graft: Secondary | ICD-10-CM

## 2016-03-31 DIAGNOSIS — F329 Major depressive disorder, single episode, unspecified: Secondary | ICD-10-CM | POA: Diagnosis present

## 2016-03-31 DIAGNOSIS — A4151 Sepsis due to Escherichia coli [E. coli]: Secondary | ICD-10-CM | POA: Diagnosis present

## 2016-03-31 DIAGNOSIS — R0682 Tachypnea, not elsewhere classified: Secondary | ICD-10-CM | POA: Diagnosis present

## 2016-03-31 DIAGNOSIS — Z66 Do not resuscitate: Secondary | ICD-10-CM | POA: Diagnosis present

## 2016-03-31 DIAGNOSIS — Z800 Family history of malignant neoplasm of digestive organs: Secondary | ICD-10-CM | POA: Diagnosis not present

## 2016-03-31 DIAGNOSIS — E785 Hyperlipidemia, unspecified: Secondary | ICD-10-CM | POA: Diagnosis present

## 2016-03-31 DIAGNOSIS — Z9889 Other specified postprocedural states: Secondary | ICD-10-CM | POA: Diagnosis not present

## 2016-03-31 DIAGNOSIS — F0280 Dementia in other diseases classified elsewhere without behavioral disturbance: Secondary | ICD-10-CM | POA: Diagnosis present

## 2016-03-31 DIAGNOSIS — Z7982 Long term (current) use of aspirin: Secondary | ICD-10-CM

## 2016-03-31 DIAGNOSIS — E1165 Type 2 diabetes mellitus with hyperglycemia: Secondary | ICD-10-CM | POA: Diagnosis present

## 2016-03-31 DIAGNOSIS — Z515 Encounter for palliative care: Secondary | ICD-10-CM | POA: Diagnosis present

## 2016-03-31 DIAGNOSIS — R400 Somnolence: Secondary | ICD-10-CM | POA: Diagnosis present

## 2016-03-31 DIAGNOSIS — N183 Chronic kidney disease, stage 3 (moderate): Secondary | ICD-10-CM | POA: Diagnosis present

## 2016-03-31 DIAGNOSIS — Z87440 Personal history of urinary (tract) infections: Secondary | ICD-10-CM | POA: Diagnosis not present

## 2016-03-31 DIAGNOSIS — I2510 Atherosclerotic heart disease of native coronary artery without angina pectoris: Secondary | ICD-10-CM | POA: Diagnosis present

## 2016-03-31 DIAGNOSIS — N390 Urinary tract infection, site not specified: Secondary | ICD-10-CM | POA: Diagnosis not present

## 2016-03-31 DIAGNOSIS — Z794 Long term (current) use of insulin: Secondary | ICD-10-CM

## 2016-03-31 DIAGNOSIS — G309 Alzheimer's disease, unspecified: Secondary | ICD-10-CM | POA: Diagnosis present

## 2016-03-31 DIAGNOSIS — Z8249 Family history of ischemic heart disease and other diseases of the circulatory system: Secondary | ICD-10-CM | POA: Diagnosis not present

## 2016-03-31 DIAGNOSIS — Z7952 Long term (current) use of systemic steroids: Secondary | ICD-10-CM

## 2016-03-31 DIAGNOSIS — E1122 Type 2 diabetes mellitus with diabetic chronic kidney disease: Secondary | ICD-10-CM | POA: Diagnosis present

## 2016-03-31 DIAGNOSIS — K5190 Ulcerative colitis, unspecified, without complications: Secondary | ICD-10-CM | POA: Diagnosis present

## 2016-03-31 DIAGNOSIS — Z85828 Personal history of other malignant neoplasm of skin: Secondary | ICD-10-CM | POA: Diagnosis not present

## 2016-03-31 DIAGNOSIS — I129 Hypertensive chronic kidney disease with stage 1 through stage 4 chronic kidney disease, or unspecified chronic kidney disease: Secondary | ICD-10-CM | POA: Diagnosis present

## 2016-03-31 DIAGNOSIS — A419 Sepsis, unspecified organism: Secondary | ICD-10-CM

## 2016-03-31 DIAGNOSIS — E039 Hypothyroidism, unspecified: Secondary | ICD-10-CM | POA: Diagnosis present

## 2016-03-31 LAB — URINALYSIS COMPLETE WITH MICROSCOPIC (ARMC ONLY)
BILIRUBIN URINE: NEGATIVE
Glucose, UA: NEGATIVE mg/dL
Ketones, ur: NEGATIVE mg/dL
NITRITE: POSITIVE — AB
PH: 5 (ref 5.0–8.0)
Protein, ur: 30 mg/dL — AB
SPECIFIC GRAVITY, URINE: 1.009 (ref 1.005–1.030)
SQUAMOUS EPITHELIAL / LPF: NONE SEEN

## 2016-03-31 LAB — BLOOD CULTURE ID PANEL (REFLEXED)
Acinetobacter baumannii: NOT DETECTED
CANDIDA GLABRATA: NOT DETECTED
CANDIDA KRUSEI: NOT DETECTED
CANDIDA TROPICALIS: NOT DETECTED
Candida albicans: NOT DETECTED
Candida parapsilosis: NOT DETECTED
Carbapenem resistance: NOT DETECTED
ESCHERICHIA COLI: DETECTED — AB
Enterobacter cloacae complex: NOT DETECTED
Enterobacteriaceae species: DETECTED — AB
Enterococcus species: NOT DETECTED
HAEMOPHILUS INFLUENZAE: NOT DETECTED
Klebsiella oxytoca: NOT DETECTED
Klebsiella pneumoniae: NOT DETECTED
LISTERIA MONOCYTOGENES: NOT DETECTED
METHICILLIN RESISTANCE: NOT DETECTED
Neisseria meningitidis: NOT DETECTED
PROTEUS SPECIES: NOT DETECTED
Pseudomonas aeruginosa: NOT DETECTED
SERRATIA MARCESCENS: NOT DETECTED
STREPTOCOCCUS PYOGENES: NOT DETECTED
Staphylococcus aureus (BCID): NOT DETECTED
Staphylococcus species: NOT DETECTED
Streptococcus agalactiae: NOT DETECTED
Streptococcus pneumoniae: NOT DETECTED
Streptococcus species: NOT DETECTED
Vancomycin resistance: NOT DETECTED

## 2016-03-31 LAB — COMPREHENSIVE METABOLIC PANEL
ALBUMIN: 2.4 g/dL — AB (ref 3.5–5.0)
ALK PHOS: 127 U/L — AB (ref 38–126)
ALT: 41 U/L (ref 14–54)
ANION GAP: 10 (ref 5–15)
AST: 43 U/L — ABNORMAL HIGH (ref 15–41)
BILIRUBIN TOTAL: 0.9 mg/dL (ref 0.3–1.2)
BUN: 29 mg/dL — AB (ref 6–20)
CALCIUM: 7.8 mg/dL — AB (ref 8.9–10.3)
CO2: 19 mmol/L — ABNORMAL LOW (ref 22–32)
Chloride: 109 mmol/L (ref 101–111)
Creatinine, Ser: 1.71 mg/dL — ABNORMAL HIGH (ref 0.44–1.00)
GFR calc Af Amer: 32 mL/min — ABNORMAL LOW (ref >60)
GFR, EST NON AFRICAN AMERICAN: 28 mL/min — AB (ref >60)
GLUCOSE: 132 mg/dL — AB (ref 65–99)
Potassium: 2.9 mmol/L — ABNORMAL LOW (ref 3.5–5.1)
Sodium: 138 mmol/L (ref 135–145)
TOTAL PROTEIN: 6.6 g/dL (ref 6.5–8.1)

## 2016-03-31 LAB — LIPASE, BLOOD: Lipase: 25 U/L (ref 11–51)

## 2016-03-31 LAB — CBC WITH DIFFERENTIAL/PLATELET
BASOS ABS: 0.1 10*3/uL (ref 0–0.1)
BASOS PCT: 0 %
EOS PCT: 0 %
Eosinophils Absolute: 0 10*3/uL (ref 0–0.7)
HCT: 25.6 % — ABNORMAL LOW (ref 35.0–47.0)
Hemoglobin: 8.3 g/dL — ABNORMAL LOW (ref 12.0–16.0)
Lymphocytes Relative: 22 %
Lymphs Abs: 2.9 10*3/uL (ref 1.0–3.6)
MCH: 24.5 pg — ABNORMAL LOW (ref 26.0–34.0)
MCHC: 32.2 g/dL (ref 32.0–36.0)
MCV: 75.9 fL — ABNORMAL LOW (ref 80.0–100.0)
MONO ABS: 0.6 10*3/uL (ref 0.2–0.9)
Monocytes Relative: 5 %
NEUTROS ABS: 9.6 10*3/uL — AB (ref 1.4–6.5)
Neutrophils Relative %: 73 %
PLATELETS: 244 10*3/uL (ref 150–440)
RBC: 3.37 MIL/uL — ABNORMAL LOW (ref 3.80–5.20)
RDW: 18.6 % — AB (ref 11.5–14.5)
WBC: 13.2 10*3/uL — AB (ref 3.6–11.0)

## 2016-03-31 LAB — LACTIC ACID, PLASMA
Lactic Acid, Venous: 1.2 mmol/L (ref 0.5–1.9)
Lactic Acid, Venous: 1.7 mmol/L (ref 0.5–1.9)

## 2016-03-31 LAB — HEMOGLOBIN A1C: Hgb A1c MFr Bld: 5.9 % (ref 4.0–6.0)

## 2016-03-31 LAB — MRSA PCR SCREENING: MRSA by PCR: NEGATIVE

## 2016-03-31 LAB — GLUCOSE, CAPILLARY
Glucose-Capillary: 135 mg/dL — ABNORMAL HIGH (ref 65–99)
Glucose-Capillary: 45 mg/dL — ABNORMAL LOW (ref 65–99)
Glucose-Capillary: 121 mg/dL — ABNORMAL HIGH (ref 65–99)
Glucose-Capillary: 92 mg/dL (ref 65–99)

## 2016-03-31 LAB — TSH: TSH: 2.363 u[IU]/mL (ref 0.350–4.500)

## 2016-03-31 LAB — TROPONIN I
Troponin I: 0.24 ng/mL (ref <0.03)
Troponin I: 0.47 ng/mL (ref <0.03)
Troponin I: 1.41 ng/mL (ref <0.03)

## 2016-03-31 MED ORDER — INSULIN GLARGINE 100 UNIT/ML ~~LOC~~ SOLN
16.0000 [IU] | Freq: Every day | SUBCUTANEOUS | Status: DC
  Filled 2016-03-31: qty 0.16

## 2016-03-31 MED ORDER — POTASSIUM CHLORIDE 10 MEQ/100ML IV SOLN
10.0000 meq | INTRAVENOUS | Status: AC
  Administered 2016-03-31 (×3): 10 meq via INTRAVENOUS
  Filled 2016-03-31 (×4): qty 100

## 2016-03-31 MED ORDER — PANTOPRAZOLE SODIUM 40 MG PO TBEC
40.0000 mg | DELAYED_RELEASE_TABLET | Freq: Every day | ORAL | Status: DC
  Filled 2016-03-31: qty 1

## 2016-03-31 MED ORDER — SODIUM CHLORIDE 0.9% FLUSH: 3.0000 mL | Freq: Two times a day (BID) | INTRAVENOUS | Status: DC

## 2016-03-31 MED ORDER — MESALAMINE 400 MG PO CPDR
800.0000 mg | DELAYED_RELEASE_CAPSULE | Freq: Three times a day (TID) | ORAL | Status: DC
  Filled 2016-03-31: qty 2

## 2016-03-31 MED ORDER — DEXTROSE-NACL 5-0.9 % IV SOLN
INTRAVENOUS | Status: DC
  Administered 2016-03-31: 05:00:00 via INTRAVENOUS

## 2016-03-31 MED ORDER — HALOPERIDOL LACTATE 5 MG/ML IJ SOLN
2.0000 mg | Freq: Four times a day (QID) | INTRAMUSCULAR | Status: DC | PRN
  Administered 2016-03-31 – 2016-04-01 (×2): 2 mg via INTRAVENOUS
  Filled 2016-03-31 (×2): qty 1

## 2016-03-31 MED ORDER — ADALIMUMAB 40 MG/0.8ML ~~LOC~~ AJKT: 40.0000 mg | AUTO-INJECTOR | SUBCUTANEOUS | Status: DC

## 2016-03-31 MED ORDER — VANCOMYCIN HCL IN DEXTROSE 750-5 MG/150ML-% IV SOLN
750.0000 mg | INTRAVENOUS | Status: DC
  Filled 2016-03-31: qty 150

## 2016-03-31 MED ORDER — DEXTROSE 50 % IV SOLN
INTRAVENOUS | Status: AC
  Administered 2016-03-31: 50 mL
  Filled 2016-03-31: qty 50

## 2016-03-31 MED ORDER — SERTRALINE HCL 100 MG PO TABS
100.0000 mg | ORAL_TABLET | Freq: Every day | ORAL | Status: DC
  Filled 2016-03-31: qty 1

## 2016-03-31 MED ORDER — INSULIN ASPART 100 UNIT/ML ~~LOC~~ SOLN: 0.0000 [IU] | Freq: Three times a day (TID) | SUBCUTANEOUS | Status: DC

## 2016-03-31 MED ORDER — SODIUM CHLORIDE 0.9 % IV BOLUS (SEPSIS)
1000.0000 mL | Freq: Once | INTRAVENOUS | Status: AC
  Administered 2016-03-31: 1000 mL via INTRAVENOUS

## 2016-03-31 MED ORDER — MORPHINE 100MG IN NS 100ML (1MG/ML) PREMIX INFUSION
5.0000 mg/h | INTRAVENOUS | Status: DC
  Administered 2016-03-31: 1 mg/h via INTRAVENOUS
  Filled 2016-03-31: qty 100

## 2016-03-31 MED ORDER — ACETAMINOPHEN 650 MG RE SUPP
650.0000 mg | Freq: Four times a day (QID) | RECTAL | Status: DC | PRN
Start: 2016-03-31 — End: 2016-03-31
  Administered 2016-03-31: 650 mg via RECTAL
  Filled 2016-03-31: qty 1

## 2016-03-31 MED ORDER — SODIUM CHLORIDE 0.9 % IV SOLN: 1.0000 mg/h | INTRAVENOUS | Status: DC

## 2016-03-31 MED ORDER — ACETAMINOPHEN 650 MG RE SUPP
650.0000 mg | Freq: Once | RECTAL | Status: AC
  Administered 2016-03-31: 650 mg via RECTAL

## 2016-03-31 MED ORDER — MEMANTINE HCL ER 14 MG PO CP24
14.0000 mg | ORAL_CAPSULE | Freq: Every day | ORAL | Status: DC
  Filled 2016-03-31: qty 1

## 2016-03-31 MED ORDER — ACETAMINOPHEN 650 MG RE SUPP
650.0000 mg | Freq: Four times a day (QID) | RECTAL | Status: DC | PRN
  Administered 2016-03-31 – 2016-04-01 (×2): 650 mg via RECTAL
  Filled 2016-03-31 (×2): qty 1

## 2016-03-31 MED ORDER — SODIUM CHLORIDE 0.9 % IV SOLN
1.0000 mg/h | INTRAVENOUS | Status: DC
  Filled 2016-03-31: qty 10

## 2016-03-31 MED ORDER — METOPROLOL SUCCINATE ER 25 MG PO TB24
25.0000 mg | ORAL_TABLET | Freq: Every day | ORAL | Status: DC
  Filled 2016-03-31: qty 1

## 2016-03-31 MED ORDER — DOCUSATE SODIUM 100 MG PO CAPS: 100.0000 mg | ORAL_CAPSULE | Freq: Two times a day (BID) | ORAL | Status: DC

## 2016-03-31 MED ORDER — LOPERAMIDE HCL 2 MG PO CAPS
2.0000 mg | ORAL_CAPSULE | Freq: Every day | ORAL | Status: DC
  Filled 2016-03-31: qty 1

## 2016-03-31 MED ORDER — PIPERACILLIN-TAZOBACTAM 3.375 G IVPB
3.3750 g | Freq: Three times a day (TID) | INTRAVENOUS | Status: DC
  Administered 2016-03-31: 3.375 g via INTRAVENOUS
  Filled 2016-03-31 (×3): qty 50

## 2016-03-31 MED ORDER — LEVOTHYROXINE SODIUM 25 MCG PO TABS: 25.0000 ug | ORAL_TABLET | Freq: Every day | ORAL | Status: DC

## 2016-03-31 MED ORDER — DEXTROSE 50 % IV SOLN
1.0000 | Freq: Once | INTRAVENOUS | Status: AC
  Administered 2016-03-31: 50 mL via INTRAVENOUS

## 2016-03-31 MED ORDER — VANCOMYCIN HCL IN DEXTROSE 1-5 GM/200ML-% IV SOLN
1000.0000 mg | Freq: Once | INTRAVENOUS | Status: AC
  Administered 2016-03-31: 1000 mg via INTRAVENOUS
  Filled 2016-03-31: qty 200

## 2016-03-31 MED ORDER — PIPERACILLIN-TAZOBACTAM 3.375 G IVPB 30 MIN
3.3750 g | Freq: Once | INTRAVENOUS | Status: AC
  Administered 2016-03-31: 3.375 g via INTRAVENOUS
  Filled 2016-03-31: qty 50

## 2016-03-31 MED ORDER — ONDANSETRON HCL 4 MG/2ML IJ SOLN: 4.0000 mg | Freq: Four times a day (QID) | INTRAMUSCULAR | Status: DC | PRN

## 2016-03-31 MED ORDER — ACETAMINOPHEN 650 MG RE SUPP
RECTAL | Status: AC
  Filled 2016-03-31: qty 1

## 2016-03-31 MED ORDER — ACETAMINOPHEN 650 MG RE SUPP: 650.0000 mg | Freq: Once | RECTAL | Status: DC

## 2016-03-31 MED ORDER — ACETAMINOPHEN 325 MG PO TABS
650.0000 mg | ORAL_TABLET | Freq: Four times a day (QID) | ORAL | Status: DC | PRN
  Filled 2016-03-31: qty 2

## 2016-03-31 MED ORDER — FERROUS SULFATE 325 (65 FE) MG PO TABS
325.0000 mg | ORAL_TABLET | Freq: Every day | ORAL | Status: DC
  Filled 2016-03-31: qty 1

## 2016-03-31 MED ORDER — MORPHINE SULFATE (PF) 2 MG/ML IV SOLN: 1.0000 mg | INTRAVENOUS | Status: DC | PRN

## 2016-03-31 MED ORDER — SODIUM CHLORIDE 0.9 % IV SOLN: INTRAVENOUS | Status: DC

## 2016-03-31 MED ORDER — GLYCOPYRROLATE 0.2 MG/ML IJ SOLN
0.1000 mg | INTRAMUSCULAR | Status: DC | PRN
  Administered 2016-04-01: 0.1 mg via INTRAVENOUS
  Filled 2016-03-31: qty 1

## 2016-03-31 MED ORDER — POTASSIUM CHLORIDE 10 MEQ/100ML IV SOLN
10.0000 meq | Freq: Once | INTRAVENOUS | Status: AC
  Administered 2016-03-31: 10 meq via INTRAVENOUS
  Filled 2016-03-31: qty 100

## 2016-03-31 MED ORDER — CLOPIDOGREL BISULFATE 75 MG PO TABS
75.0000 mg | ORAL_TABLET | Freq: Every day | ORAL | Status: DC
  Filled 2016-03-31: qty 1

## 2016-03-31 MED ORDER — VITAMIN D (ERGOCALCIFEROL) 1.25 MG (50000 UNIT) PO CAPS: 50000.0000 [IU] | ORAL_CAPSULE | ORAL | Status: DC

## 2016-03-31 MED ORDER — ONDANSETRON HCL 4 MG PO TABS: 4.0000 mg | ORAL_TABLET | Freq: Four times a day (QID) | ORAL | Status: DC | PRN

## 2016-03-31 MED ORDER — ASPIRIN EC 81 MG PO TBEC
81.0000 mg | DELAYED_RELEASE_TABLET | Freq: Every day | ORAL | Status: DC
  Filled 2016-03-31: qty 1

## 2016-03-31 MED ORDER — PRAVASTATIN SODIUM 20 MG PO TABS: 80.0000 mg | ORAL_TABLET | Freq: Every day | ORAL | Status: DC

## 2016-03-31 MED ORDER — PREDNISONE 10 MG PO TABS
10.0000 mg | ORAL_TABLET | Freq: Every day | ORAL | Status: DC
  Filled 2016-03-31: qty 1

## 2016-03-31 NOTE — ED Notes (Addendum)
Pt from assisted living facility with possible aspiration - Skin hot, raspy cough noted - cbg 48 by ems, d50 one amp given by ems - repeat cbg by ems 207 - repeat cbg in er 135 - ms states end tidal co2 22-26 - Pills and food present in mouth on arrival to ed - pt was reported to have been "normal" at lunch but became nonverbal and nonresponsive this evening - pt noted to have multiple skin tears and bruising on bilat legs and arms

## 2016-03-31 NOTE — Clinical Social Work Note (Signed)
Clinical Social Work Assessment  Patient Details  Name: Bethany Russell MRN: [id number] Date of Birth: 1941/06/29  Date of referral:  04/07/2016               Reason for consult:  Discharge Planning                Permission sought to share information with:  Family Supports Permission granted to share information::  Yes, Verbal Permission Granted  Name::        Agency::     Relationship::   Bethany Russell - Daughter )  Contact Information:     Housing/Transportation Living arrangements for the past 2 months:  [place] ([place] [facility] ) Source of Information:    Patient Interpreter Needed:  None Criminal Activity/Legal Involvement Pertinent to Current Situation/Hospitalization:  No - Comment as needed Significant Relationships:  Other Family Members, Adult Children Lives with:  Facility Resident ([facility] ) Do you feel safe going back to the place where you live?  Yes Need for family participation in patient care:  Yes (Comment) Bethany Russell - Daughter )  Care giving concerns:  Patient is from [place] [facility] [facility]. Her daughter Bethany Russell is interested in information about Hospice and Hospice Home.    Social Worker assessment / plan:  CSW met with patient and family at bedside. Patient was in the bed and a staff member from [facility] was visiting. CSW introduced herself to patient's daughter and her described her role. Patient's daughter reports she does not feel patient will make it back to [facility]. Patient has been a resident at this facility for the past several months. Stated she's interested in [place] services. Stated that she would like to speak to the Palliative Care MD to discuss end of life options.Patient's daughter reports that patient is currently comfort care. CSW will continue to follow patient to determine discharge planning needs.   Employment status:  Retired Office manager PT Recommendations:  Not assessed at this time Information / Referral to community resources:     Patient/Family's Response to care:  Patient's daughter reports that she is appreciates of CSW coming to visit.   Patient/Family's Understanding of and Emotional Response to Diagnosis, Current Treatment, and Prognosis:  Patient's daughter reports that she understands patient's prognosis is poor and that comfort measures should be taken to allow patient to pass peacefully.   Emotional Assessment Appearance:  Appears stated age Attitude/Demeanor/Rapport:  Lethargic Affect (typically observed):  Calm Orientation:  Oriented to Self Alcohol / Substance use:  Not Applicable Psych involvement (Current and /or in the community):  No (Comment)  Discharge Needs  Concerns to be addressed:  Discharge Planning Concerns Readmission within the last 30 days:  No Current discharge risk:  Chronically ill Barriers to Discharge:  Continued Medical Work up   [employer], LCSW 04/19/2016, 2:49 PM

## 2016-03-31 NOTE — ED Provider Notes (Signed)
[facility] Emergency Department Provider Note  ____________________________________________  Time seen: 1:10 AM  I have reviewed the triage vital signs and the nursing notes.  Unable to obtain history from the patient's secondary to dementia and altered mental status HISTORY  Chief Complaint No chief complaint on file.      HPI Bethany Russell is a 75 y.o. female presents via EMS from outside nursing facility with history of altered mental status and fever. Patient per EMS is 102 on their arrival. Patient presents to the emergency department with a temperature 103.6 tachycardic heart rate of 1:30 tachypnea and respiratory rate of 30. Per EMS patient was "normal at lunch time yesterday.    Past Medical History  Diagnosis Date  . Ulcerative colitis ([place])   . Hypertension   . Diabetes mellitus without complication ([place])   . Cancer ([place])     skin cancer  . Coronary artery disease   . GI bleed   . Urinary tract infection   . Vitamin D deficiency   . Dementia   . Diabetes mellitus, type II ([place])   . Thyroid disease     Patient Active Problem List   Diagnosis Date Noted  . UTI (lower urinary tract infection) 11/25/2015  . Arteriosclerosis of coronary artery 11/22/2015  . Ulcerative colitis ([place]) 09/20/2015  . GAD (generalized anxiety disorder) 09/20/2015  . Hyperlipidemia 09/20/2015  . Dementia 09/20/2015  . Coronary artery disease 06/07/2015  . Acute posthemorrhagic anemia 06/07/2015  . DM (diabetes mellitus) type 2, uncontrolled, with ketoacidosis ([place]) 06/07/2015  . Pressure ulcer 06/01/2015  . Protein-calorie malnutrition, severe ([place]) 06/01/2015  . Severe protein-calorie malnutrition Altamease Oiler: less than 60% of standard weight) ([place]) 06/01/2015  . Sepsis ([place]) 05/31/2015  . Dysthymia 02/04/2015  . Other long term (current) drug therapy 02/04/2015  . Acquired hypothyroidism 08/06/2014  . Chronic kidney disease, stage III (moderate)  08/06/2014  . 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 H/O adenomatous polyp of colon 08/06/2014  . Degeneration of intervertebral disc of lumbar region 08/06/2014  . HLD (hyperlipidemia) 08/06/2014  . 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 Type 2 diabetes mellitus ([place]) 08/06/2014  . Carotid artery narrowing 06/15/2014  . Bradycardia 06/15/2014  . Absolute anemia 05/12/2014  . BP (high blood pressure) 05/12/2014  . 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 Avitaminosis D 05/12/2014    Past Surgical History  Procedure Laterality Date  . Coronary artery bypass graft    . Skin biopsy    . Flexible sigmoidoscopy N/A 06/08/2015    Procedure: FLEXIBLE SIGMOIDOSCOPY;  Surgeon: Manya Silvas, MD;  Location: [facility] ENDOSCOPY;  Service: Endoscopy;  Laterality: N/A;  . Colonoscopy N/A 06/09/2015    Procedure: COLONOSCOPY;  Surgeon: Manya Silvas, MD;  Location: [facility] ENDOSCOPY;  Service: Endoscopy;  Laterality: N/A;    Current Outpatient Rx  Name  Route  Sig  Dispense  Refill  . aspirin 81 MG tablet   Oral   Take 81 mg by mouth daily.         . clopidogrel (PLAVIX) 75 MG tablet   Oral   Take 75 mg by mouth daily.         . ferrous sulfate 325 (65 FE) MG tablet   Oral   Take 325 mg by mouth daily with breakfast.          . HUMIRA PEN 40 MG/0.8ML PNKT   Subcutaneous   Inject 40 mg into the skin every 14 (fourteen) days. Pt  uses on Tuesday.   2 each   5     Dispense as written.   . Insulin Glargine (LANTUS SOLOSTAR) 100 UNIT/ML Solostar Pen   Subcutaneous   Inject 26 Units into the skin daily.   15 mL   11   . insulin lispro (HUMALOG) 100 UNIT/ML injection   Subcutaneous   Inject 10-14 Units into the skin 3 (three) times daily with meals. 12 units at breakfast if BS <110 or 10 units if BS <110 14 units at lunch if BS <110 or 10 units if BS <110 12 units at supper if BS <110 or 10 units if BS <110 Hold if not eating         .  levothyroxine (SYNTHROID, LEVOTHROID) 25 MCG tablet   Oral   Take 25 mcg by mouth daily.          Marland Kitchen loperamide (IMODIUM) 2 MG capsule   Oral   Take 2 mg by mouth daily.          . memantine (NAMENDA XR) 14 MG CP24 24 hr capsule   Oral   Take 14 mg by mouth daily.         . Mesalamine (ASACOL HD) 800 MG TBEC   Oral   Take 800 mg by mouth 3 (three) times daily.          . metoprolol succinate (TOPROL-XL) 25 MG 24 hr tablet   Oral   Take 1 tablet (25 mg total) by mouth daily.   90 tablet   2   . pantoprazole (PROTONIX) 40 MG tablet   Oral   Take 40 mg by mouth daily.          . pravastatin (PRAVACHOL) 80 MG tablet   Oral   Take 1 tablet (80 mg total) by mouth at bedtime.   90 tablet   3   . predniSONE (DELTASONE) 10 MG tablet   Oral   Take 10 mg by mouth daily with breakfast.         . sertraline (ZOLOFT) 100 MG tablet   Oral   Take 100 mg by mouth daily.         . Vitamin D, Ergocalciferol, (DRISDOL) 50000 UNITS CAPS capsule   Oral   Take 50,000 Units by mouth once a week. Pt takes on Sunday.           Allergies No known drug allergies  Family History  Problem Relation Age of Onset  . CAD Mother   . CAD Father   . Colon cancer Brother     Social History Social History  Substance Use Topics  . Smoking status: Never Smoker   . Smokeless tobacco: Never Used  . Alcohol Use: No    Review of Systems  Constitutional: Positive for fever. Eyes: Negative for visual changes. ENT: Negative for sore throat. Cardiovascular: Negative for chest pain. Respiratory: Negative for shortness of breath. Gastrointestinal: Negative for abdominal pain, vomiting and diarrhea. Genitourinary: Negative for dysuria. Musculoskeletal: Negative for back pain. Skin: Negative for rash. Neurological: Positive for altered mental status   10-point ROS otherwise negative.  ____________________________________________   PHYSICAL EXAM:  VITAL SIGNS: ED Triage  Vitals  Enc Vitals Group     BP 04/16/2016 0100 133/88 mmHg     Pulse Rate 04/09/2016 0100 131     Resp 04/11/2016 0109 30     Temp 04/07/2016 0109 103.6 F (39.8 C)     Temp Source 04/16/2016 0109 Rectal  SpO2 04/18/2016 0100 92 %     Weight 04/12/2016 0109 126 lb 5.2 oz (57.3 kg)     Height --      Head Cir --      Peak Flow --      Pain Score --      Pain Loc --      Pain Edu? --      Excl. in [place]? --     Constitutional: Alert but confused Eyes: Conjunctivae are normal. PERRL. Normal extraocular movements. ENT   Head: Normocephalic and atraumatic.   Nose: No congestion/rhinnorhea.   Mouth/Throat: Dry mucous membranes. Pill fragments noted in patient's mouth (removed)   Neck: No stridor. Hematological/Lymphatic/Immunilogical: No cervical lymphadenopathy. Cardiovascular: Tachycardia. Normal and symmetric distal pulses are present in all extremities. No murmurs, rubs, or gallops. Respiratory: Tachypnea. Bibasilar rhonchi. Gastrointestinal: Soft and nontender. No distention. There is no CVA tenderness. Genitourinary: deferred Musculoskeletal: Nontender with normal range of motion in all extremities. No joint effusions.  No lower extremity tenderness nor edema. Neurologic:   No gross focal neurologic deficits are appreciated.  Skin:  Skin is hot to touch. No rash noted. Ecchymoses bilateral lower extremities   ____________________________________________    LABS (pertinent positives/negatives)  Labs Reviewed  COMPREHENSIVE METABOLIC PANEL - Abnormal; Notable for the following:    Potassium 2.9 (*)    CO2 19 (*)    Glucose, Bld 132 (*)    BUN 29 (*)    Creatinine, Ser 1.71 (*)    Calcium 7.8 (*)    Albumin 2.4 (*)    AST 43 (*)    Alkaline Phosphatase 127 (*)    GFR calc non Af Amer 28 (*)    GFR calc Af Amer 32 (*)    All other components within normal limits  CBC WITH DIFFERENTIAL/PLATELET - Abnormal; Notable for the following:    WBC 13.2 (*)    RBC 3.37 (*)     Hemoglobin 8.3 (*)    HCT 25.6 (*)    MCV 75.9 (*)    MCH 24.5 (*)    RDW 18.6 (*)    Neutro Abs 9.6 (*)    All other components within normal limits  TROPONIN I - Abnormal; Notable for the following:    Troponin I 0.24 (*)    All other components within normal limits  URINALYSIS COMPLETEWITH MICROSCOPIC ([facility] ONLY) - Abnormal; Notable for the following:    Color, Urine YELLOW (*)    APPearance CLOUDY (*)    Hgb urine dipstick 1+ (*)    Protein, ur 30 (*)    Nitrite POSITIVE (*)    Leukocytes, UA 3+ (*)    Bacteria, UA MANY (*)    All other components within normal limits  GLUCOSE, CAPILLARY - Abnormal; Notable for the following:    Glucose-Capillary 135 (*)    All other components within normal limits  CULTURE, BLOOD (ROUTINE X 2)  CULTURE, BLOOD (ROUTINE X 2)  URINE CULTURE  LACTIC ACID, PLASMA  LIPASE, BLOOD  LACTIC ACID, PLASMA     ____________________________________________   EKG  ED ECG REPORT I, [place] N Creasie Lacosse, the attending physician, personally viewed and interpreted this ECG.   Date: 04/09/2016  EKG Time: 1:37 AM  Rate: 129  Rhythm: Sinus tachycardia  Axis: Normal  Intervals: Normal  ST&T Change: None   ____________________________________________    RADIOLOGY  DG Chest Port 1 View (Final result) Result time: 03/31/16 01:37:44   Final result by Rad Results In  Interface (04/21/2016 01:37:44)   Narrative:   CLINICAL DATA: Dyspnea and fever  EXAM: PORTABLE CHEST 1 VIEW  COMPARISON: 11/24/2015  FINDINGS: Moderate cardiomegaly. The lungs are clear. The pulmonary vasculature is normal. No large effusions.  IMPRESSION: Unchanged cardiomegaly. No acute findings.   Electronically Signed By: Andreas Newport M.D. On: 04/06/2016 01:37      Procedures  Critical care:CRITICAL CARE Performed by: Gregor Hams   Total critical care time: 45 minutes  Critical care time was exclusive of separately billable procedures  and treating other patients.  Critical care was necessary to treat or prevent imminent or life-threatening deterioration.  Critical care was time spent personally by me on the following activities: development of treatment plan with patient and/or surrogate as well as nursing, discussions with consultants, evaluation of patient's response to treatment, examination of patient, obtaining history from patient or surrogate, ordering and performing treatments and interventions, ordering and review of laboratory studies, ordering and review of radiographic studies, pulse oximetry and re-evaluation of patient's condition.   INITIAL IMPRESSION / ASSESSMENT AND PLAN / ED COURSE  Pertinent labs & imaging results that were available during my care of the patient were reviewed by me and considered in my medical decision making (see chart for details).  History physical exam consistent with sepsis as such patient received IV vancomycin and Zosyn shortly after arrival to the emergency department as well as 30 ML's per kilogram of IV normal saline. Patient received rectal Tylenol. Spoke with the patient's daughters at bedside and informed him of all clinical findings. Patient discussed with Dr. Mathis Bud with admission for further management.  ____________________________________________   FINAL CLINICAL IMPRESSION(S) / ED DIAGNOSES  Final diagnoses:  UTI (lower urinary tract infection)  Sepsis, due to unspecified organism [facility])      Gregor Hams, MD 04/28/2016 [phone]

## 2016-03-31 NOTE — Progress Notes (Signed)
[place] [facility] [place] NAME: Bethany Russell    MR#:  [id number]  DATE OF BIRTH:  1940/12/04  SUBJECTIVE: Admitted this morning for sepsis, UTI. Patient is lethargic.  patient's daughters requested comfort care for her. They do not want IV fluids, IV medications including antibiotics, home medications. Wanted morphine for comfort care. They do not want any blood work. Agree with vitals every day.   CHIEF COMPLAINT:  No chief complaint on file.   REVIEW OF SYSTEMS:   Review of Systems  Unable to perform ROS: critical illness     DRUG ALLERGIES:  No Known Allergies  VITALS:  Blood pressure 133/59, pulse 111, temperature 101.2 F (38.4 C), temperature source Axillary, resp. rate 16, height 5\' 3"  (1.6 m), weight 58.741 kg (129 lb 8 oz), SpO2 98 %.  PHYSICAL EXAMINATION:  GENERAL:  75 y.o.-year-old patient lying in the bed with no acute distress. Laying in the bed, opens eyes to verbal stimuli.opens eyes  To verbal stimuli. EYES: Pupils equal, round, reactive to light and accommodation. No scleral icterus. Extraocular muscles intact.  HEENT: Head atraumatic, normocephalic. Oropharynx and nasopharynx clear.  NECK:  Supple, no jugular venous distention. No thyroid enlargement, no tenderness.  LUNGS: Normal breath sounds bilaterally, no wheezing, rales,rhonchi or crepitation. No use of accessory muscles of respiration.  CARDIOVASCULAR: S1, S2 normal. No murmurs, rubs, or gallops.  ABDOMEN: Soft, nontender, nondistended. Bowel sounds present. No organomegaly or mass.  EXTREMITIES: No pedal edema, cyanosis, or clubbing.  NEUROLOGIC: Able to do full neurological exam because of lethargy. PSYCHIATRIC: The patient is  Lethargic. SKIN: No obvious rash, lesion, or ulcer.    LABORATORY PANEL:   CBC  Recent Labs Lab 04/12/2016 0100  WBC 13.2*  HGB 8.3*  HCT 25.6*  PLT 244    ------------------------------------------------------------------------------------------------------------------  Chemistries   Recent Labs Lab 04/15/2016 0100  NA 138  K 2.9*  CL 109  CO2 19*  GLUCOSE 132*  BUN 29*  CREATININE 1.71*  CALCIUM 7.8*  AST 43*  ALT 41  ALKPHOS 127*  BILITOT 0.9   ------------------------------------------------------------------------------------------------------------------  Cardiac Enzymes  Recent Labs Lab 04/26/2016 0443  TROPONINI 0.47*   ------------------------------------------------------------------------------------------------------------------  RADIOLOGY:  Dg Chest Port 1 View  04/17/2016  CLINICAL DATA:  Dyspnea and fever EXAM: PORTABLE CHEST 1 VIEW COMPARISON:  11/24/2015 FINDINGS: Moderate cardiomegaly. The lungs are clear. The pulmonary vasculature is normal. No large effusions. IMPRESSION: Unchanged cardiomegaly.  No acute findings. Electronically Signed   By: Andreas Newport M.D.   On: 04/17/2016 01:37    EKG:   Orders placed or performed during the hospital encounter of 04/12/2016  . ED EKG 12-Lead  . ED EKG 12-Lead  . EKG 12-Lead  . EKG 12-Lead    ASSESSMENT AND PLAN:   #1, sepsis present on admission secondary to UTI: Causing lethargy, fever and tachypnea. Started on IV antibiotics, IV fluids. Discussed with patient's daughters they both wanted her to be comfortable and wanted to initiate the comfort care measures and they don't want any treatment for urine ion or other abnormalities. Because patient has advanced dementia and has several medical problems and has recurrent infections. Don't want her to her to go through aggressive treatments and rather they want her to be comfortable with comfort care measures. Start the morphine drip.and ativan. 2. Advanced dementia 3.lcerative colitis #4 diabetes mellitus type 2: Hypoglycemia on this admission. #5 coronary artery disease;  more than 50% time spent in  Counseling  and coordination of services All the records are reviewed and case discussed with Care Management/Social Workerr. Management plans discussed with the patient, family and they are in agreement.  CODE STATUS: DNR  TOTAL TIME TAKING CARE OF THIS PATIENT: 35 min   .Epifanio Lesches M.D on 04/24/2016 at 9:38 AM  Between 7am to 6pm - Pager - [phone]  After 6pm go to [email or web address] - password [email or web address] [facility]  Office  [phone]  CC: Primary care physician; Sheral Flow, NP   Note: This dictation was prepared with Dragon dictation along with smaller phrase technology. Any transcriptional errors that result from this process are unintentional.

## 2016-03-31 NOTE — Progress Notes (Signed)
Spoke with Dr. Marcille Blanco pt FBS 45. Md to place orders.

## 2016-03-31 NOTE — ED Notes (Signed)
Dr. Marcille Blanco in to assess.

## 2016-03-31 NOTE — Progress Notes (Signed)
Patient cannot swallow or follow commands. Tylenol suppository given for fever. Dr Vianne Bulls notified, make patient NPO and speech eval. Orders placed.

## 2016-03-31 NOTE — Progress Notes (Signed)
Prime doc paged waiting on callback. 

## 2016-03-31 NOTE — H&P (Signed)
Bethany Russell is an 75 y.o. female.   Chief Complaint: Altered mental status HPI: The patient with past medical history of dementia as well as diabetes and coronary artery disease presents emergency department from her nursing home with somnolence and decreased responsiveness. The patient is unable to contribute to her own history. Her daughters are at bedside. Apparently she was in her normal state of health this morning and ate well however as the day progressed she became progressively more somnolent. Prior to transfer to the emergency department the patient's blood pressure was found to be low. In the emergency department she was given multiple fluid boluses temperature blood pressure responded well. CT of the head showed no acute intracranial abnormalities. Blood cultures were obtained and the patient was started on broad-spectrum antibiotics for presumed sepsis. Emergency department staff called the hospitalist service for further management.  Past Medical History  Diagnosis Date  . Ulcerative colitis ([place])   . Hypertension   . Diabetes mellitus without complication ([place])   . Cancer ([place])     skin cancer  . Coronary artery disease   . GI bleed   . Urinary tract infection   . Vitamin D deficiency   . Dementia   . Diabetes mellitus, type II ([place])   . Thyroid disease     Past Surgical History  Procedure Laterality Date  . Coronary artery bypass graft    . Skin biopsy    . Flexible sigmoidoscopy N/A 06/08/2015    Procedure: FLEXIBLE SIGMOIDOSCOPY;  Surgeon: Manya Silvas, MD;  Location: [facility] ENDOSCOPY;  Service: Endoscopy;  Laterality: N/A;  . Colonoscopy N/A 06/09/2015    Procedure: COLONOSCOPY;  Surgeon: Manya Silvas, MD;  Location: [facility] ENDOSCOPY;  Service: Endoscopy;  Laterality: N/A;    Family History  Problem Relation Age of Onset  . CAD Mother   . CAD Father   . Colon cancer Brother    Social History:  reports that she has never smoked. She has never used  smokeless tobacco. She reports that she does not drink alcohol or use illicit drugs.  Allergies: No Known Allergies   (Not in a hospital admission)  Results for orders placed or performed during the hospital encounter of 04/11/2016 (from the past 48 hour(s))  Comprehensive metabolic panel     Status: Abnormal   Collection Time: 04/16/2016  1:00 AM  Result Value Ref Range   Sodium 138 135 - 145 mmol/L   Potassium 2.9 (L) 3.5 - 5.1 mmol/L   Chloride 109 101 - 111 mmol/L   CO2 19 (L) 22 - 32 mmol/L   Glucose, Bld 132 (H) 65 - 99 mg/dL   BUN 29 (H) 6 - 20 mg/dL   Creatinine, Ser 1.71 (H) 0.44 - 1.00 mg/dL   Calcium 7.8 (L) 8.9 - 10.3 mg/dL   Total Protein 6.6 6.5 - 8.1 g/dL   Albumin 2.4 (L) 3.5 - 5.0 g/dL   AST 43 (H) 15 - 41 U/L   ALT 41 14 - 54 U/L   Alkaline Phosphatase 127 (H) 38 - 126 U/L   Total Bilirubin 0.9 0.3 - 1.2 mg/dL   GFR calc non Af Amer 28 (L) >60 mL/min   GFR calc Af Amer 32 (L) >60 mL/min    Comment: (NOTE) The eGFR has been calculated using the CKD EPI equation. This calculation has not been validated in all clinical situations. eGFR's persistently <60 mL/min signify possible Chronic Kidney Disease.    Anion gap 10 5 -  15  CBC WITH DIFFERENTIAL     Status: Abnormal   Collection Time: 04/15/2016  1:00 AM  Result Value Ref Range   WBC 13.2 (H) 3.6 - 11.0 K/uL   RBC 3.37 (L) 3.80 - 5.20 MIL/uL   Hemoglobin 8.3 (L) 12.0 - 16.0 g/dL   HCT 25.6 (L) 35.0 - 47.0 %   MCV 75.9 (L) 80.0 - 100.0 fL   MCH 24.5 (L) 26.0 - 34.0 pg   MCHC 32.2 32.0 - 36.0 g/dL   RDW 18.6 (H) 11.5 - 14.5 %   Platelets 244 150 - 440 K/uL   Neutrophils Relative % 73 %   Neutro Abs 9.6 (H) 1.4 - 6.5 K/uL   Lymphocytes Relative 22 %   Lymphs Abs 2.9 1.0 - 3.6 K/uL   Monocytes Relative 5 %   Monocytes Absolute 0.6 0.2 - 0.9 K/uL   Eosinophils Relative 0 %   Eosinophils Absolute 0.0 0 - 0.7 K/uL   Basophils Relative 0 %   Basophils Absolute 0.1 0 - 0.1 K/uL  Lactic acid, plasma     Status:  None   Collection Time: 04/16/2016  1:00 AM  Result Value Ref Range   Lactic Acid, Venous 1.2 0.5 - 1.9 mmol/L  Troponin I     Status: Abnormal   Collection Time: 04/29/2016  1:00 AM  Result Value Ref Range   Troponin I 0.24 (HH) <0.03 ng/mL    Comment: CRITICAL RESULT CALLED TO, READ BACK BY AND VERIFIED WITH TERESA HUDSON AT 0139 ON 04/03/2016 RWW   Lipase, blood     Status: None   Collection Time: 04/20/2016  1:00 AM  Result Value Ref Range   Lipase 25 11 - 51 U/L  Urinalysis complete, with microscopic ([facility] only)     Status: Abnormal   Collection Time: 04/07/2016  1:39 AM  Result Value Ref Range   Color, Urine YELLOW (A) YELLOW   APPearance CLOUDY (A) CLEAR   Glucose, UA NEGATIVE NEGATIVE mg/dL   Bilirubin Urine NEGATIVE NEGATIVE   Ketones, ur NEGATIVE NEGATIVE mg/dL   Specific Gravity, Urine 1.009 1.005 - 1.030   Hgb urine dipstick 1+ (A) NEGATIVE   pH 5.0 5.0 - 8.0   Protein, ur 30 (A) NEGATIVE mg/dL   Nitrite POSITIVE (A) NEGATIVE   Leukocytes, UA 3+ (A) NEGATIVE   RBC / HPF 0-5 0 - 5 RBC/hpf   WBC, UA TOO NUMEROUS TO COUNT 0 - 5 WBC/hpf   Bacteria, UA MANY (A) NONE SEEN   Squamous Epithelial / LPF NONE SEEN NONE SEEN   WBC Clumps PRESENT    Dg Chest Port 1 View  04/02/2016  CLINICAL DATA:  Dyspnea and fever EXAM: PORTABLE CHEST 1 VIEW COMPARISON:  11/24/2015 FINDINGS: Moderate cardiomegaly. The lungs are clear. The pulmonary vasculature is normal. No large effusions. IMPRESSION: Unchanged cardiomegaly.  No acute findings. Electronically Signed   By: Andreas Newport M.D.   On: 04/30/2016 01:37    Review of Systems  Unable to perform ROS: dementia    Blood pressure 133/88, pulse 130, temperature 103.6 F (39.8 C), temperature source Rectal, resp. rate 30, weight 57.3 kg (126 lb 5.2 oz), SpO2 98 %. Physical Exam  Vitals reviewed. Constitutional: She is oriented to person, place, and time. She appears well-developed and well-nourished. No distress.  HENT:  Head:  Normocephalic and atraumatic.  Mouth/Throat: Oropharynx is clear and moist.  Eyes: Conjunctivae and EOM are normal. Pupils are equal, round, and reactive to light. No scleral  icterus.  Neck: Normal range of motion. Neck supple. No JVD present. No tracheal deviation present. No thyromegaly present.  Cardiovascular: Normal rate, regular rhythm and normal heart sounds.  Exam reveals no gallop and no friction rub.   No murmur heard. Respiratory: Effort normal and breath sounds normal.  GI: Soft. Bowel sounds are normal. She exhibits no distension. There is no tenderness.  Genitourinary:  Deferred  Musculoskeletal: Normal range of motion. She exhibits no edema.  Lymphadenopathy:    She has no cervical adenopathy.  Neurological: She is alert and oriented to person, place, and time. No cranial nerve deficit. She exhibits normal muscle tone.  Spasticity in upper extremities bilaterally  Skin: Skin is warm and dry.  Multiple areas of ecchymosis all 4 extremities  Psychiatric:  Difficult to assess mental status as the patient is nonverbal at this time     Assessment/Plan This is a 75 year old female admitted for sepsis. 1. Sepsis: The patient meets criteria via tachycardia, fever and tachypnea. She is hemodynamically stable and fluid responsive. Continue broad-spectrum antibiotics. Follow cultures for growth and sensitivity. 2. Diabetes mellitus type 2: Continue basal insulin adjusted for hospital diet. Sliding-scale insulin while hospitalized. 3. Hypothyroidism: Continue Synthroid. Check TSH as reversible causes of altered mental status 4. CAD: Stable; continue aspirin and Plavix 5. Ulcerative colitis: Stable; continue Humira as well as prednisone. 6. Hyperlipidemia: Continue statin therapy 7. Dementia: Continue Namenda 8. Depression: Continue Zoloft 9. DVT prophylaxis: SCDs 10. GI prophylaxis: None The patient is a DO NOT RESUSCITATE. Time spent on admission orders and patient care  approximately 45 minutes  Harrie Foreman, MD 04/14/2016, 2:27 AM

## 2016-03-31 NOTE — ED Notes (Signed)
Report from teresa, rn.  

## 2016-03-31 NOTE — Progress Notes (Signed)
Troponin 1.41. Dr Vianne Bulls notified. No new orders.

## 2016-03-31 NOTE — ED Notes (Signed)
Pt opening eyes and interacting miniamally with family at bedside. Family updated on admission process.

## 2016-03-31 NOTE — ED Notes (Signed)
Lab called critical troponin level of 0.24 - reported to Dr Owens Shark

## 2016-03-31 NOTE — ED Notes (Signed)
Placed rectal temp probe.

## 2016-03-31 NOTE — ED Notes (Signed)
Notified pharmacy of need for potassium

## 2016-03-31 NOTE — Progress Notes (Signed)
DR Vianne Bulls NOTIFIED. PT IS COMFORT MEASURES ONLY WITH RN TO PRONOUNCE

## 2016-03-31 NOTE — Progress Notes (Signed)
Troponin 0.47 Dr. Marcille Blanco notified.

## 2016-03-31 NOTE — Progress Notes (Signed)
Report called to [facility] on 1C.

## 2016-03-31 NOTE — Progress Notes (Signed)
Pharmacy Antibiotic Note  Bethany Russell is a 75 y.o. female admitted on 04/23/2016 with sepsis.  Pharmacy has been consulted for Vancomycin and Zosyn dosing.  Ke=0.023 T1/2=30hr Vd=41.1L  Plan: Vancomycin 750mg  IV every 36 hours.  Goal trough 15-20 mcg/mL. Zosyn 3.375g IV q8h (4 hour infusion). Will check a SCr with AM labs to keep close check on renal function.  Height: 5\' 3"  (160 cm) Weight: 129 lb 8 oz (58.741 kg) IBW/kg (Calculated) : 52.4  Temp (24hrs), Avg:102.1 F (38.9 C), Min:99.6 F (37.6 C), Max:103.6 F (39.8 C)   Recent Labs Lab 04/28/2016 0100  WBC 13.2*  CREATININE 1.71*  LATICACIDVEN 1.2    Estimated Creatinine Clearance: 22.8 mL/min (by C-G formula based on Cr of 1.71).    No Known Allergies  Antimicrobials this admission: Vancomycin 7/1 >>  Zosyn 7/1 >>   Dose adjustments this admission:  Microbiology results:  Thank you for allowing pharmacy to be a part of this patient's care.  Paulina Fusi, PharmD, BCPS 04/26/2016 5:24 AM

## 2016-03-31 NOTE — Progress Notes (Signed)
PHARMACY - PHYSICIAN COMMUNICATION CRITICAL VALUE ALERT - BLOOD CULTURE IDENTIFICATION (BCID)  Results for orders placed or performed during the hospital encounter of 04/25/2016  Blood Culture ID Panel (Reflexed) (Collected: 04/24/2016  1:30 AM)  Result Value Ref Range   Enterococcus species NOT DETECTED NOT DETECTED   Vancomycin resistance NOT DETECTED NOT DETECTED   Listeria monocytogenes NOT DETECTED NOT DETECTED   Staphylococcus species NOT DETECTED NOT DETECTED   Staphylococcus aureus NOT DETECTED NOT DETECTED   Methicillin resistance NOT DETECTED NOT DETECTED   Streptococcus species NOT DETECTED NOT DETECTED   Streptococcus agalactiae NOT DETECTED NOT DETECTED   Streptococcus pneumoniae NOT DETECTED NOT DETECTED   Streptococcus pyogenes NOT DETECTED NOT DETECTED   Acinetobacter baumannii NOT DETECTED NOT DETECTED   Enterobacteriaceae species DETECTED (A) NOT DETECTED   Enterobacter cloacae complex NOT DETECTED NOT DETECTED   Escherichia coli DETECTED (A) NOT DETECTED   Klebsiella oxytoca NOT DETECTED NOT DETECTED   Klebsiella pneumoniae NOT DETECTED NOT DETECTED   Proteus species NOT DETECTED NOT DETECTED   Serratia marcescens NOT DETECTED NOT DETECTED   Carbapenem resistance NOT DETECTED NOT DETECTED   Haemophilus influenzae NOT DETECTED NOT DETECTED   Neisseria meningitidis NOT DETECTED NOT DETECTED   Pseudomonas aeruginosa NOT DETECTED NOT DETECTED   Candida albicans NOT DETECTED NOT DETECTED   Candida glabrata NOT DETECTED NOT DETECTED   Candida krusei NOT DETECTED NOT DETECTED   Candida parapsilosis NOT DETECTED NOT DETECTED   Candida tropicalis NOT DETECTED NOT DETECTED    Name of physician (or Provider) Contacted: None  Changes to prescribed antibiotics required: Antibiotics d/c as patient is comfort care.   Ulice Dash D 03/31/2016  4:54 PM

## 2016-03-31 NOTE — Care Management Note (Signed)
Case Management Note  Patient Details  Name: Rosibel Bueter MRN: [id number] Date of Birth: [birth year]  Subjective/Objective:   75yo Ms [place] was admitted 04/27/2016 from [facility] with [facility]. MD order for Comfort Care only. [facility]-SW is following for placement as needed. Case management is signing off.                  Action/Plan:   Expected Discharge Date:  04/04/16               Expected Discharge Plan:     In-House Referral:     Discharge planning Services     Post Acute Care Choice:    Choice offered to:     DME Arranged:    DME Agency:     HH Arranged:    HH Agency:     Status of Service:     If discussed at [employer], dates discussed:    Additional Comments:  Laquinda Moller A, RN 04/22/2016, 4:14 PM

## 2016-04-02 LAB — CULTURE, BLOOD (ROUTINE X 2)

## 2016-04-02 LAB — URINE CULTURE: Culture: >=100000 — AB

## 2016-04-09 ENCOUNTER — Ambulatory Visit: Payer: Medicare Other | Admitting: Psychiatry

## 2016-05-01 NOTE — Progress Notes (Signed)
[place] [facility] [place] NAME: Bethany Russell    MR#:  [id number]  DATE OF BIRTH:  Apr 28, 1941  SUBJECTIVE: Patient is on comfort care measures. Has fever received Tylenol suppository this morning and also some Haldol as per family request. She is on morphine drip 2 mg/h. And patient's daughters are at bedside and they are  Pleased with the care that she is receiving as per their request.   CHIEF COMPLAINT:  No chief complaint on file.   REVIEW OF SYSTEMS:   Review of Systems  Unable to perform ROS: critical illness     DRUG ALLERGIES:  No Known Allergies  VITALS:  Blood pressure 117/54, pulse 124, temperature 101.5 F (38.6 C), temperature source Oral, resp. rate 16, height 5\' 3"  (1.6 m), weight 58.741 kg (129 lb 8 oz), SpO2 100 %.  PHYSICAL EXAMINATION:  GENERAL:  75 y.o.-year-old patient lying in the bed with no acute distress. Laying in the bed, unresponsive.Marland Kitchen EYES: Pupils equal, round, reactive to light and accommodation. No scleral icterus. Extraocular muscles intact.  HEENT: Head atraumatic, normocephalic. Oropharynx and nasopharynx clear.  NECK:  Supple, no jugular venous distention. No thyroid enlargement, no tenderness.  LUNGS: Normal breath sounds bilaterally, no wheezing, rales,rhonchi or crepitation. No use of accessory muscles of respiration.  CARDIOVASCULAR: S1, S2 normal. No murmurs, rubs, or gallops.  ABDOMEN: Soft, nontender, nondistended. Bowel sounds present. No organomegaly or mass.  EXTREMITIES: No pedal edema, cyanosis, or clubbing.  NEUROLOGIC: Able to do full neurological exam because of lethargy. PSYCHIATRIC: The patient is  Unresponsive, SKIN: No obvious rash, lesion, or ulcer.    LABORATORY PANEL:   CBC  Recent Labs Lab 04/11/2016 0100  WBC 13.2*  HGB 8.3*  HCT 25.6*  PLT 244    ------------------------------------------------------------------------------------------------------------------  Chemistries   Recent Labs Lab 04/14/2016 0100  NA 138  K 2.9*  CL 109  CO2 19*  GLUCOSE 132*  BUN 29*  CREATININE 1.71*  CALCIUM 7.8*  AST 43*  ALT 41  ALKPHOS 127*  BILITOT 0.9   ------------------------------------------------------------------------------------------------------------------  Cardiac Enzymes  Recent Labs Lab 04/11/2016 0919  TROPONINI 1.41*   ------------------------------------------------------------------------------------------------------------------  RADIOLOGY:  Dg Chest Port 1 View  04/12/2016  CLINICAL DATA:  Dyspnea and fever EXAM: PORTABLE CHEST 1 VIEW COMPARISON:  11/24/2015 FINDINGS: Moderate cardiomegaly. The lungs are clear. The pulmonary vasculature is normal. No large effusions. IMPRESSION: Unchanged cardiomegaly.  No acute findings. Electronically Signed   By: Andreas Newport M.D.   On: 04/24/2016 01:37    EKG:   Orders placed or performed during the hospital encounter of 04/17/2016  . ED EKG 12-Lead  . ED EKG 12-Lead  . EKG 12-Lead  . EKG 12-Lead    ASSESSMENT AND PLAN:   #1, sepsis present on admission secondary to UTI: Escherichia coli in the blood and urine: Causing lethargy, fever and tachypnea. Patient is on comfort care with morphine drip. As per family request  Continue that. Family at the bedside. Offered emotional support. 2. Advanced dementia 3.ucerative colitis #4 diabetes mellitus type 2: Hypoglycemia on this admission. #5 coronary artery disease; D/w daughters,d/w RN   All the records are reviewed and case discussed with Care Management/Social Workerr. Management plans discussed with the patient, family and they are in agreement.  CODE STATUS: DNR  TOTAL TIME TAKING CARE OF THIS PATIENT: 20 min   .Epifanio Lesches M.D on 2016/04/16 at 11:12 AM  Between 7am to 6pm - Pager -  [phone]  After 6pm go to [email or web address] - password [email or web address] [facility]  Office  [phone]  CC: Primary care physician; Sheral Flow, NP   Note: This dictation was prepared with Dragon dictation along with smaller phrase technology. Any transcriptional errors that result from this process are unintentional.

## 2016-05-01 NOTE — Care Management Important Message (Signed)
Important Message  Patient Details  Name: Bethany Russell MRN: [id number] Date of Birth: 10-23-1940   Medicare Important Message Given:  Yes    Edwin Baines A, RN Apr 27, 2016, 11:11 AM

## 2016-05-01 NOTE — Progress Notes (Signed)
Verified by this RN, Hiram Gash, and Rowe Robert RN that patient passed away (no pulse, no breathing) at 1312.  Family at bedside, emotional support given.  Nursing supervisor notified.  Dr. Vianne Bulls notified.

## 2016-05-01 NOTE — Discharge Summary (Signed)
    Death Note please see Last Note for all details.   In breif -75 year old female patient with dementia, diabetes, coronary artery disease sent in from nursing home because of altered mental status, fever, hypotension and tachypnea with respiratory rate 30..  Initial CAT scan did not show any acute changes.  Patient informed to have hypokalemia Bartosh 2.9, acute renal failure with creatinine 1.7 and BUN 29, urinalysis showed many bacteria.  Admitted to medical service for sepsis .  Started on vancomycin, Zosyn, IV hydration.  Because of her dementia, multiple medical problems with ulcerative colitis, diabetes mellitus, history of previous UTIs patient's family wanted her to be comfortable. Her daughters(POA) requested comfort care measures, the started morphine drip and discontinued IV antibiotics, IV fluids and other blood work as per their request..    Patient blood cultures and urine culture showed Escherichia coli .she continued to have fever and tachypnea.  We maintain the patient on morphine drip for comfort care as per family request.  Patient pronounced dead at 1:12 PM on 04/23/23.   [place] [id number] is a 75 y.o. female, Outpatient Primary MD for the patient is Sheral Flow, NP  Pronounced dead by  RN  Apr 23, 2023 at 1:12 PM.        Cause of death     ecoli Sepsis Advanced Alzheimer's dementia Diabetes mellitus type II Ulcerative colitis Previous UTIs      Total clinical and documentation time for today Under 30 minutes   Last Note

## 2016-05-01 DEATH — deceased

## 2017-10-11 IMAGING — US US RENAL
1 series · 14 of 25 positions shown · non-contrast
Comparison: CT abdomen pelvis - 05/31/2015; 10/07/2013

CLINICAL DATA: Acute renal failure

EXAM:
RENAL / URINARY TRACT ULTRASOUND COMPLETE

[Series 1: us renal · 0.23mm/px · 14 of 37 slices shown]
[im 1/37]
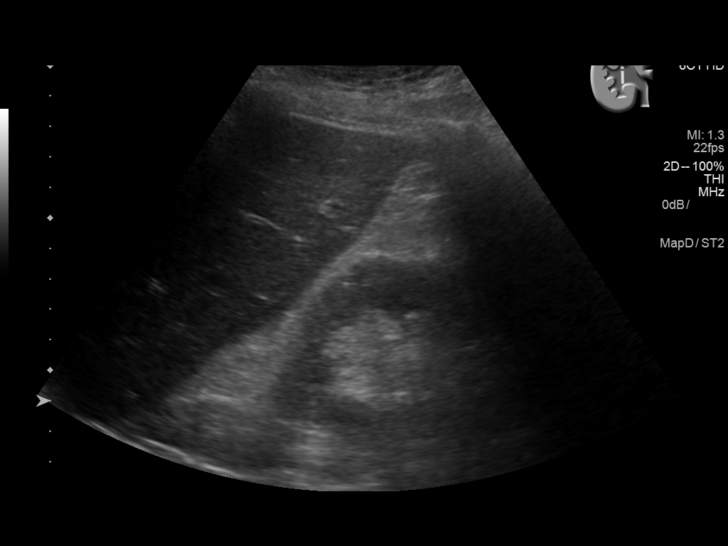
[im 4/37]
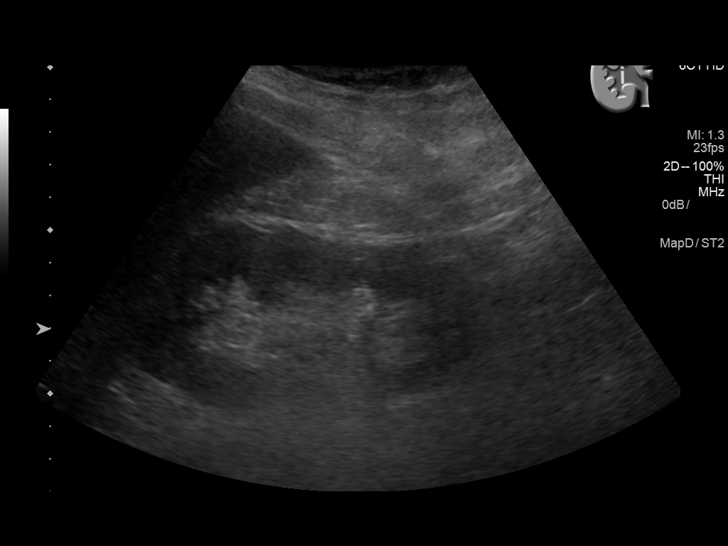
[im 7/37]
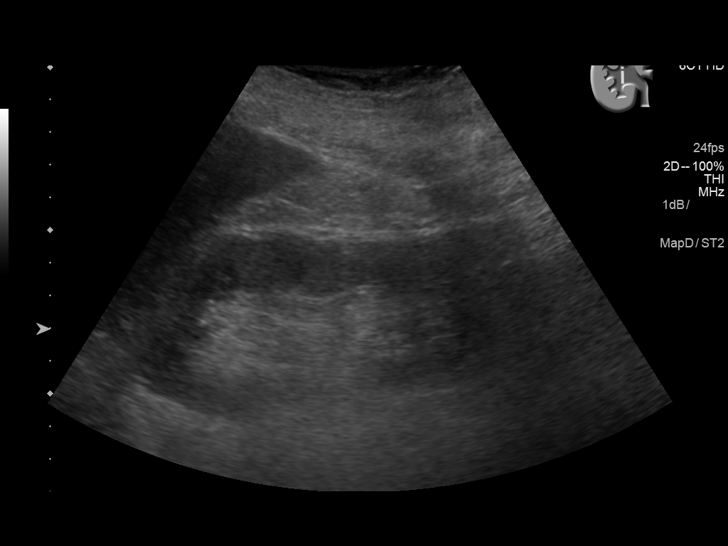
[im 10/37]
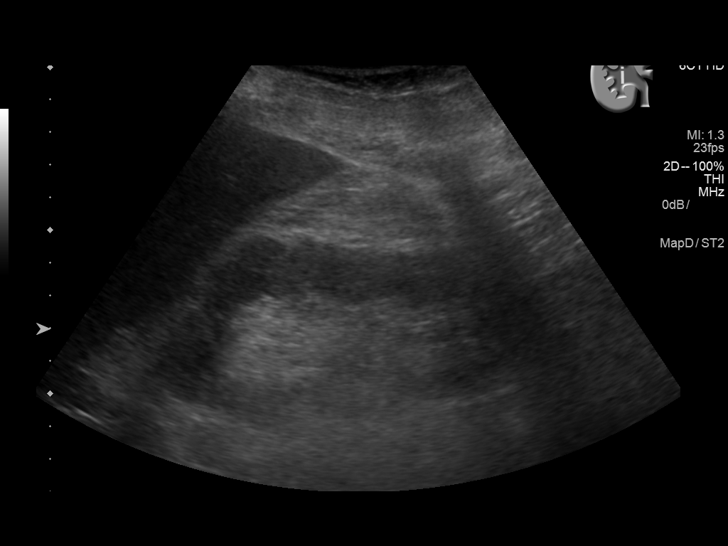
[im 13/37]
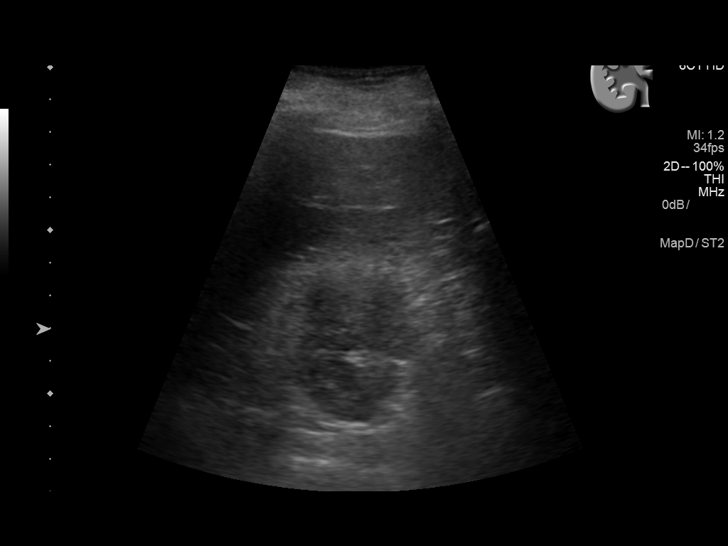
[im 14/37]
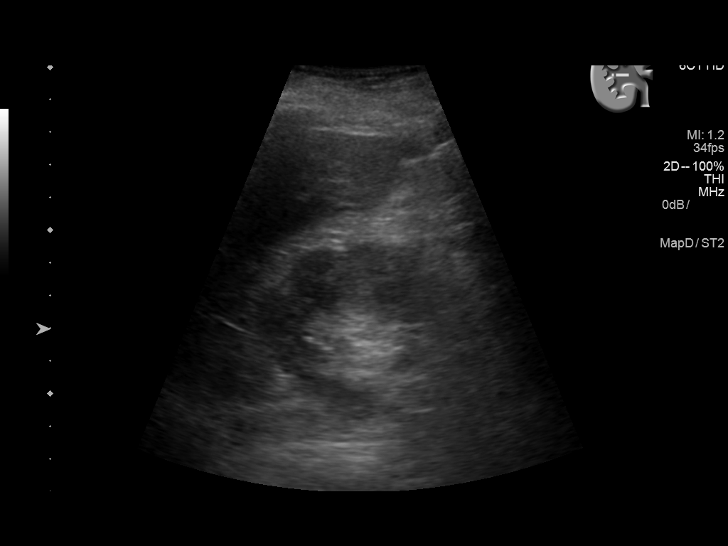
[im 17/37]
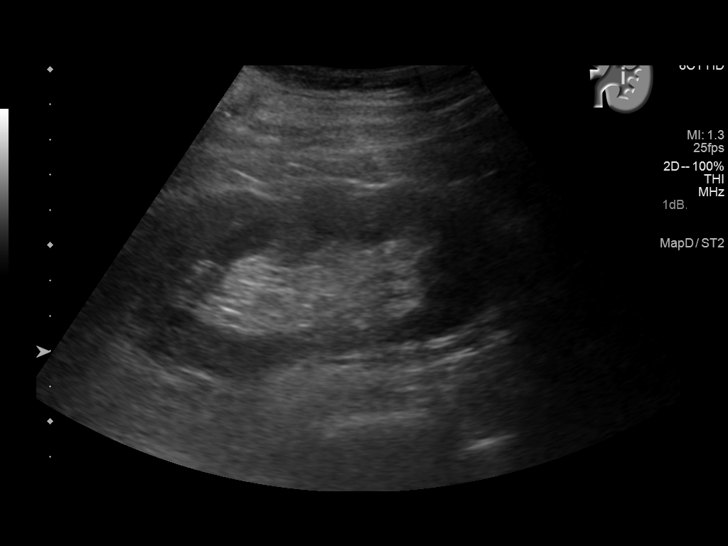
[im 20/37]
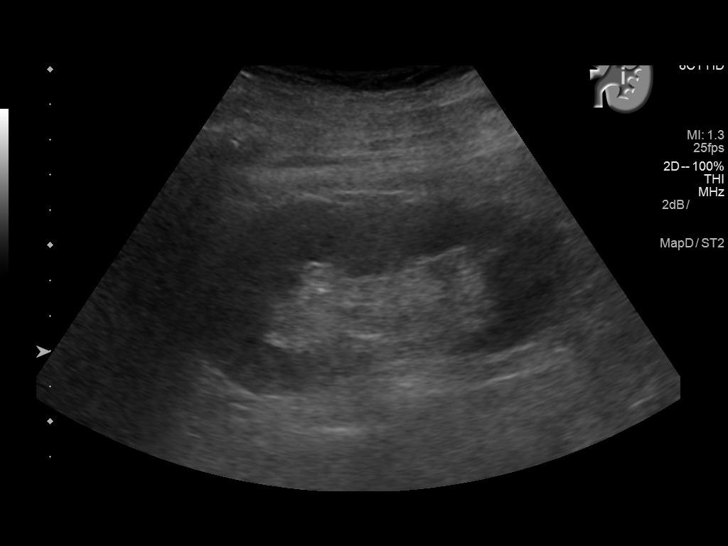
[im 23/37]
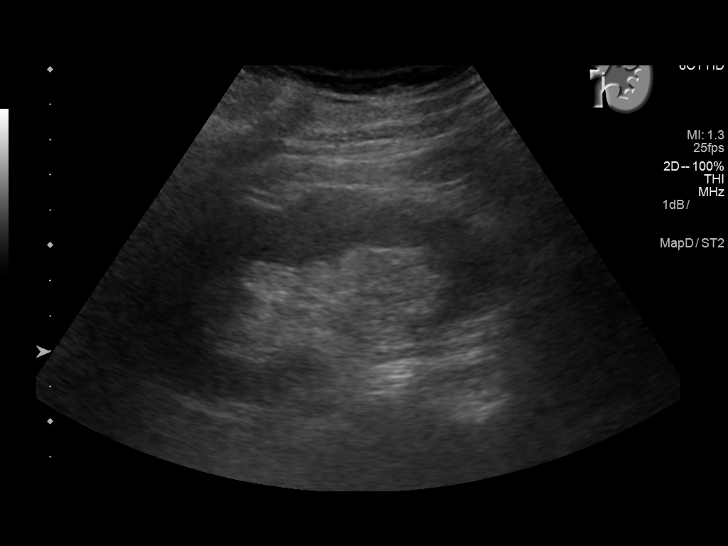
[im 25/37]
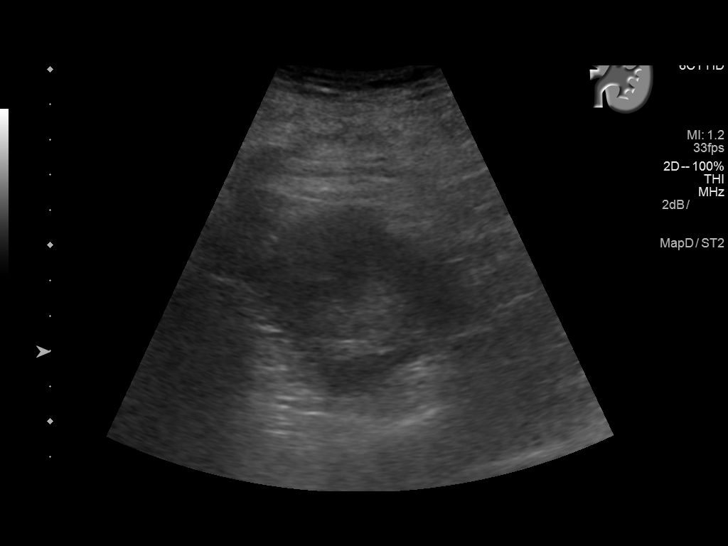
[im 28/37]
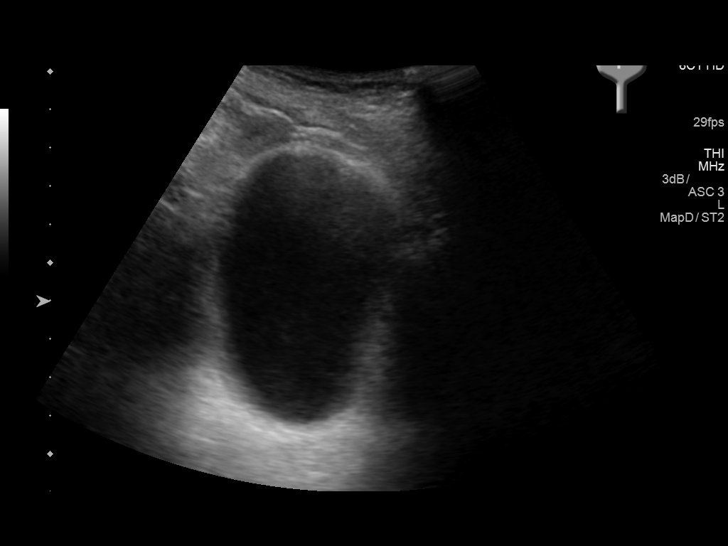
[im 31/37]
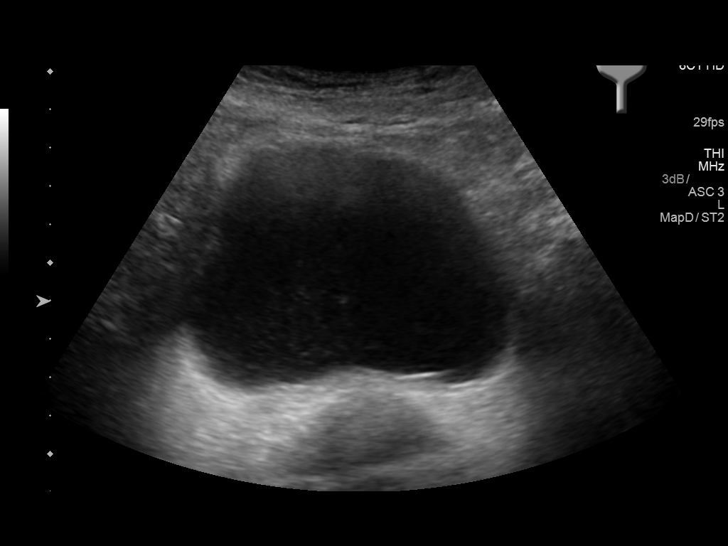
[im 34/37]
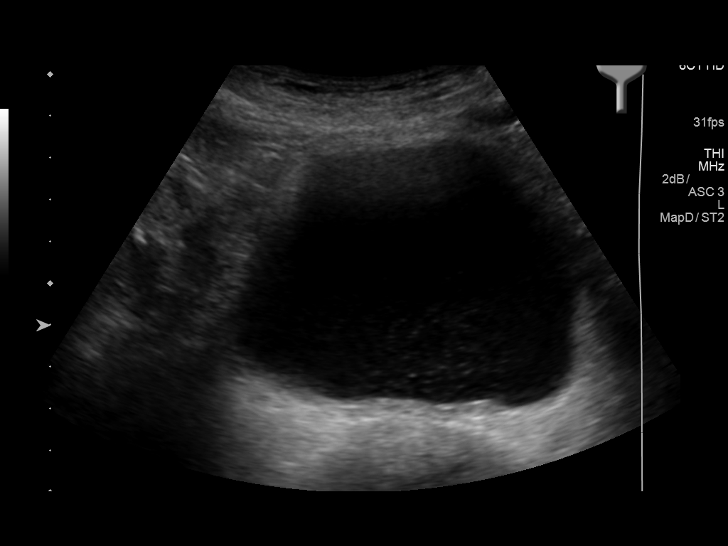
[im 37/37]
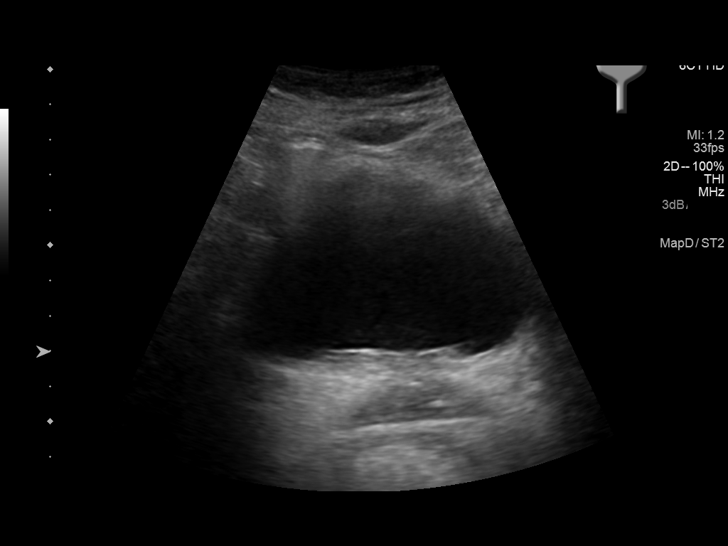

[14 of 25 positions shown; findings below may reference images not displayed]

FINDINGS: Right Kidney:

Normal cortical thickness, echogenicity and size, measuring 11.5 cm
in length. No focal renal lesions. No echogenic renal stones. No
urinary obstruction.

Left Kidney:

Normal cortical thickness, echogenicity and size, measuring 10.9 cm
in length. No focal renal lesions. No echogenic renal stones. No
urinary obstruction.

Bladder:

Minimal amount of mixed echogenic debris is noted lying dependently
within the urinary bladder (image 36). Otherwise, normal appearance
of the urinary bladder given degree distention.
IMPRESSION: 1. No explanation for patient's acute renal failure. Specifically,
no evidence of urinary obstruction or morphologic evidence of
medical renal disease.
2. Minimal amount of debris noted lying dependently within otherwise
normal-appearing urinary bladder. Correlation with urinalysis is
recommended.
